# Patient Record
Sex: Male | Born: 1935 | Race: White | Hispanic: No | Marital: Married | Smoking: Current some day smoker
Health system: Southern US, Community
[De-identification: ages and names within clinical notes are randomized; demographics above are authoritative.]

## PROBLEM LIST (undated history)

## (undated) DIAGNOSIS — E119 Type 2 diabetes mellitus without complications: Secondary | ICD-10-CM

## (undated) DIAGNOSIS — F419 Anxiety disorder, unspecified: Secondary | ICD-10-CM

## (undated) DIAGNOSIS — I10 Essential (primary) hypertension: Secondary | ICD-10-CM

## (undated) DIAGNOSIS — R351 Nocturia: Secondary | ICD-10-CM

## (undated) DIAGNOSIS — IMO0001 Reserved for inherently not codable concepts without codable children: Secondary | ICD-10-CM

## (undated) DIAGNOSIS — K219 Gastro-esophageal reflux disease without esophagitis: Secondary | ICD-10-CM

## (undated) DIAGNOSIS — E785 Hyperlipidemia, unspecified: Secondary | ICD-10-CM

## (undated) DIAGNOSIS — M1990 Unspecified osteoarthritis, unspecified site: Secondary | ICD-10-CM

## (undated) DIAGNOSIS — R350 Frequency of micturition: Secondary | ICD-10-CM

## (undated) HISTORY — PX: OTHER SURGICAL HISTORY: SHX169

---

## 1968-07-01 HISTORY — PX: OTHER SURGICAL HISTORY: SHX169

## 1984-07-01 HISTORY — PX: SPINAL FUSION: SHX223

## 1996-07-01 HISTORY — PX: CHOLECYSTECTOMY: SHX55

## 1999-06-14 ENCOUNTER — Ambulatory Visit (HOSPITAL_COMMUNITY): Admission: RE | Admit: 1999-06-14 | Discharge: 1999-06-14 | Payer: Self-pay | Admitting: Gastroenterology

## 2002-12-31 ENCOUNTER — Encounter: Payer: Self-pay | Admitting: Orthopedic Surgery

## 2002-12-31 ENCOUNTER — Encounter: Admission: RE | Admit: 2002-12-31 | Discharge: 2002-12-31 | Payer: Self-pay | Admitting: Orthopedic Surgery

## 2004-08-22 ENCOUNTER — Ambulatory Visit (HOSPITAL_COMMUNITY): Admission: RE | Admit: 2004-08-22 | Discharge: 2004-08-22 | Payer: Self-pay | Admitting: Gastroenterology

## 2004-08-22 ENCOUNTER — Encounter (INDEPENDENT_AMBULATORY_CARE_PROVIDER_SITE_OTHER): Payer: Self-pay | Admitting: Specialist

## 2006-05-16 ENCOUNTER — Emergency Department (HOSPITAL_COMMUNITY): Admission: EM | Admit: 2006-05-16 | Discharge: 2006-05-16 | Payer: Self-pay | Admitting: Emergency Medicine

## 2008-09-28 ENCOUNTER — Encounter: Admission: RE | Admit: 2008-09-28 | Discharge: 2008-09-28 | Payer: Self-pay | Admitting: Orthopedic Surgery

## 2008-10-12 ENCOUNTER — Encounter: Admission: RE | Admit: 2008-10-12 | Discharge: 2009-01-09 | Payer: Self-pay | Admitting: Orthopedic Surgery

## 2009-06-22 ENCOUNTER — Ambulatory Visit: Payer: Self-pay | Admitting: Diagnostic Radiology

## 2009-06-22 ENCOUNTER — Emergency Department (HOSPITAL_BASED_OUTPATIENT_CLINIC_OR_DEPARTMENT_OTHER): Admission: EM | Admit: 2009-06-22 | Discharge: 2009-06-22 | Payer: Self-pay | Admitting: Emergency Medicine

## 2010-10-01 LAB — DIFFERENTIAL
Basophils Absolute: 0 10*3/uL (ref 0.0–0.1)
Basophils Relative: 1 % (ref 0–1)
Eosinophils Absolute: 0.3 10*3/uL (ref 0.0–0.7)
Eosinophils Relative: 3 % (ref 0–5)
Monocytes Absolute: 0.6 10*3/uL (ref 0.1–1.0)
Monocytes Relative: 7 % (ref 3–12)

## 2010-10-01 LAB — CBC
HCT: 39.6 % (ref 39.0–52.0)
Hemoglobin: 14 g/dL (ref 13.0–17.0)
MCHC: 35.3 g/dL (ref 30.0–36.0)
MCV: 90 fL (ref 78.0–100.0)
RBC: 4.4 MIL/uL (ref 4.22–5.81)

## 2010-10-01 LAB — POCT CARDIAC MARKERS
CKMB, poc: 4.2 ng/mL (ref 1.0–8.0)
Myoglobin, poc: 186 ng/mL (ref 12–200)

## 2010-10-01 LAB — BASIC METABOLIC PANEL: Calcium: 9.8 mg/dL (ref 8.4–10.5)

## 2010-11-16 NOTE — Op Note (Signed)
NAMECAYLEB, [name] NO.:  [id number]   MEDICAL RECORD NO.:  [id number]          PATIENT TYPE:  AMB   LOCATION:  ENDO                         FACILITY:  [facility]   PHYSICIAN:  Danise Edge, M.D.   DATE OF BIRTH:  [birth year]   DATE OF PROCEDURE:  08/22/2004  DATE OF DISCHARGE:                                 OPERATIVE REPORT   INDICATIONS:  Ricardo Rivera is a 77 year old male born January 19, 1933. Mr.  Ricardo Rivera mother was diagnosed with colon cancer. Ricardo Rivera  colonoscopy December 2000 revealed left colonic Rivera but no  colorectal neoplasia.   ENDOSCOPIST:  Danise Edge, M.D.   PREMEDICATION:  Versed and 7.5 mg, Demerol 60 mg.   PROCEDURE:  After obtaining informed consent, Ricardo Rivera was placed in the  left lateral decubitus position. I administered intravenous Demerol and  intravenous Versed to achieve conscious sedation for the procedure. The  patient's blood pressure, oxygen saturation and cardiac rhythm were  monitored throughout the procedure and documented in the medical record.   Anal inspection and digital rectal exam were normal. The prostate was non-  nodular. The Olympus adjustable pediatric colonoscope was introduced into  the rectum and advanced to the cecum. Colonic preparation for the exam today  was satisfactory.   Ricardo Rivera. He had some difficulty  in retaining the insufflated air as I pulled the endoscope down the left  colon which compromised visualization to some degree.   Rectum normal.  Sigmoid colon and descending colon:  Extensive left colonic Rivera.  Splenic flexure normal.  Transverse colon normal.  Hepatic flexure normal.  Descending colon normal.  Cecum and ileocecal valve:  A diminutive 1 mm polyp was removed from the  proximal cecum with the cold biopsy forceps.   ASSESSMENT:  A diminutive polyp was removed from the cecum; extensive left  colonic  Rivera.   RECOMMENDATIONS:  Repeat colonoscopy or virtual colonoscopy in 5 years.      MJ/MEDQ  D:  08/22/2004  T:  08/22/2004  Job:  [id number]   cc:   Al Decant. Janey Greaser, MD  [address]  [place]  [place] [address]  Fax: [phone]

## 2011-01-14 ENCOUNTER — Other Ambulatory Visit: Payer: Self-pay

## 2011-01-14 ENCOUNTER — Encounter: Payer: Self-pay | Admitting: *Deleted

## 2011-01-14 ENCOUNTER — Emergency Department (HOSPITAL_BASED_OUTPATIENT_CLINIC_OR_DEPARTMENT_OTHER)
Admission: EM | Admit: 2011-01-14 | Discharge: 2011-01-14 | Disposition: A | Discharge status: Home / Self Care | Payer: Medicare Other | Attending: Emergency Medicine | Admitting: Emergency Medicine

## 2011-01-14 DIAGNOSIS — R071 Chest pain on breathing: Secondary | ICD-10-CM | POA: Insufficient documentation

## 2011-01-14 DIAGNOSIS — F17200 Nicotine dependence, unspecified, uncomplicated: Secondary | ICD-10-CM | POA: Insufficient documentation

## 2011-01-14 DIAGNOSIS — R079 Chest pain, unspecified: Secondary | ICD-10-CM | POA: Insufficient documentation

## 2011-01-14 DIAGNOSIS — R0789 Other chest pain: Secondary | ICD-10-CM

## 2011-01-14 LAB — DIFFERENTIAL
Eosinophils Relative: 5 % (ref 0–5)
Lymphs Abs: 3.5 10*3/uL (ref 0.7–4.0)
Neutro Abs: 3.6 10*3/uL (ref 1.7–7.7)
Neutrophils Relative %: 44 % (ref 43–77)

## 2011-01-14 LAB — COMPREHENSIVE METABOLIC PANEL
ALT: 12 U/L (ref 0–53)
AST: 16 U/L (ref 0–37)
Albumin: 3.6 g/dL (ref 3.5–5.2)
CO2: 24 mEq/L (ref 19–32)
GFR calc non Af Amer: >60 mL/min (ref >60)
Total Bilirubin: 2.4 mg/dL — ABNORMAL HIGH (ref 0.3–1.2)
Total Protein: 8.9 g/dL — ABNORMAL HIGH (ref 6.0–8.3)

## 2011-01-14 LAB — CK TOTAL AND CKMB (NOT AT ARMC): CK, MB: 2.3 ng/mL (ref 0.3–4.0)

## 2011-01-14 LAB — CBC
HCT: 37.3 % — ABNORMAL LOW (ref 39.0–52.0)
MCH: 29.8 pg (ref 26.0–34.0)
MCV: 85.6 fL (ref 78.0–100.0)
WBC: 8.3 10*3/uL (ref 4.0–10.5)

## 2011-01-14 MED ORDER — ASPIRIN 81 MG PO CHEW
324.0000 mg | CHEWABLE_TABLET | Freq: Once | ORAL | Status: AC
  Administered 2011-01-14: 324 mg via ORAL
  Filled 2011-01-14: qty 4

## 2011-01-14 NOTE — ED Notes (Signed)
MD at bedside. 

## 2011-01-14 NOTE — ED Notes (Signed)
Pt to room 7 in w/c, able to stand and walk to bed in nad.  Pt reports mid chest "sharp" pains after working out at fitness center on Thursday.  Pt states he only has pain if he coughs or moves a certain way, no pain or sensation in his chest at any other time.  Denies sob, nausea or any other c/o, states he has been "belching a lot" also.

## 2011-01-14 NOTE — ED Provider Notes (Signed)
History     Chief Complaint  Patient presents with  . Chest Pain   The history is provided by the patient and the spouse.  The patient began having sharp right sided chest pain which began Friday after lifting weights at gym  Pain has occurred with movement of arm and trunk but not exertional.  Pain is sharp but not associated with dyspnea, sweating, light headedness.  The patient has had a stress test in the past which his wife reports as normal. There is also low back pain which occurred at sight of previous fusion after laying on stretcher.    History reviewed. No pertinent past medical history.  History reviewed. No pertinent past surgical history.  History reviewed. No pertinent family history.  History  Substance Use Topics  . Smoking status: Current Everyday Smoker  . Smokeless tobacco: Not on file  . Alcohol Use: No      Review of Systems  All other systems reviewed and are negative.    Physical Exam  BP 155/84  Temp(Src) 98.4 F (36.9 C) (Oral)  Ht 5\' 11"  (1.803 m)  Wt 190 lb (86.183 kg)  BMI 26.50 kg/m2  SpO2 95%  Physical Exam  Nursing note and vitals reviewed. Constitutional: He is oriented to person, place, and time. He appears well-developed and well-nourished.  HENT:  Head: Normocephalic and atraumatic.  Eyes: Pupils are equal, round, and reactive to light.  Neck: Normal range of motion.  Cardiovascular: Normal rate and regular rhythm.   Pulmonary/Chest: He exhibits tenderness.       Tender over sternum and right side of chest with palpation reproducing pain  Abdominal: Soft. Bowel sounds are normal.  Musculoskeletal: Normal range of motion.  Neurological: He is alert and oriented to person, place, and time.  Skin: Skin is warm and dry.  Psychiatric: He has a normal mood and affect.    ED Course  Procedures  MDM  Date: 01/14/2011  Rate: 96 Rhythm: normal sinus rhythm  QRS Axis: normal  Intervals: normal  ST/T Wave abnormalities: normal  Conduction Disutrbances:right bundle branch block  Narrative Interpretation:   Old EKG Reviewed: unchanged from 22 Jun 2009       Hilario Quarry, MD 01/14/11 1616

## 2014-01-01 ENCOUNTER — Encounter (HOSPITAL_BASED_OUTPATIENT_CLINIC_OR_DEPARTMENT_OTHER): Payer: Self-pay | Admitting: Emergency Medicine

## 2014-01-01 ENCOUNTER — Emergency Department (HOSPITAL_BASED_OUTPATIENT_CLINIC_OR_DEPARTMENT_OTHER)
Admission: EM | Admit: 2014-01-01 | Discharge: 2014-01-01 | Disposition: A | Discharge status: Home / Self Care | Payer: Medicare Other | Attending: Emergency Medicine | Admitting: Emergency Medicine

## 2014-01-01 DIAGNOSIS — F17200 Nicotine dependence, unspecified, uncomplicated: Secondary | ICD-10-CM | POA: Insufficient documentation

## 2014-01-01 DIAGNOSIS — M5412 Radiculopathy, cervical region: Secondary | ICD-10-CM | POA: Insufficient documentation

## 2014-01-01 DIAGNOSIS — Z7982 Long term (current) use of aspirin: Secondary | ICD-10-CM | POA: Insufficient documentation

## 2014-01-01 DIAGNOSIS — M79609 Pain in unspecified limb: Secondary | ICD-10-CM | POA: Diagnosis present

## 2014-01-01 DIAGNOSIS — M792 Neuralgia and neuritis, unspecified: Secondary | ICD-10-CM

## 2014-01-01 DIAGNOSIS — Z79899 Other long term (current) drug therapy: Secondary | ICD-10-CM | POA: Diagnosis not present

## 2014-01-01 LAB — BASIC METABOLIC PANEL
ANION GAP: 18 — AB (ref 5–15)
BUN: 17 mg/dL (ref 6–23)
CO2: 21 meq/L (ref 19–32)
Calcium: 9.9 mg/dL (ref 8.4–10.5)
Chloride: 97 mEq/L (ref 96–112)
Creatinine, Ser: 0.8 mg/dL (ref 0.50–1.35)
GFR, EST NON AFRICAN AMERICAN: 83 mL/min — AB (ref >90)
GLUCOSE: 105 mg/dL — AB (ref 70–99)
Potassium: 4.5 mEq/L (ref 3.7–5.3)
SODIUM: 136 meq/L — AB (ref 137–147)

## 2014-01-01 MED ORDER — GABAPENTIN 300 MG PO CAPS: 300.0000 mg | ORAL_CAPSULE | Freq: Every day | ORAL | Status: DC

## 2014-01-01 MED ORDER — HYDROCODONE-ACETAMINOPHEN 5-325 MG PO TABS: 2.0000 | ORAL_TABLET | ORAL | Status: DC | PRN

## 2014-01-01 NOTE — ED Notes (Signed)
Patient c/o R arm shooting pain that started last week. No relief with meds

## 2014-01-01 NOTE — ED Provider Notes (Addendum)
CSN: [id number]     Arrival date & time 01/01/14  0726 History   First MD Initiated Contact with Patient 01/01/14 0750     Chief Complaint  Patient presents with  . Arm Pain      HPI Patient presents with pain to right arm.  Been there for the past week.  No known history of trauma.  His pain is a shooting pain and keeps him awake at night.  Seems to primarily involves the third fourth and fifth digit of his right hand. He als complains of some neck pain History reviewed. No pertinent past medical history. Past Surgical History  Procedure Laterality Date  . Spinal surgery      fusion  . Cholecystectomy     No family history on file. History  Substance Use Topics  . Smoking status: Current Some Day Smoker  . Smokeless tobacco: Not on file  . Alcohol Use: No    Review of Systems  All other systems reviewed and are negative  Allergies  Review of patient's allergies indicates no known allergies.  Home Medications   Prior to Admission medications   Medication Sig Start Date End Date Taking? Authorizing Provider  brimonidine (ALPHAGAN P) 0.1 % SOLN Place 2 drops into both eyes.   Yes Historical Provider, MD  Cholecalciferol (VITAMIN D) 2000 UNITS CAPS Take by mouth.   Yes Historical Provider, MD  oxybutynin (DITROPAN) 5 MG tablet Take 5 mg by mouth daily.   Yes Historical Provider, MD  pantoprazole (PROTONIX) 40 MG tablet Take 40 mg by mouth daily.   Yes Historical Provider, MD  Travoprost, BAK Free, (TRAVATAN) 0.004 % SOLN ophthalmic solution Place 1 drop into both eyes at bedtime.   Yes Historical Provider, MD  allopurinol (ZYLOPRIM) 300 MG tablet Take 300 mg by mouth daily.      Historical Provider, MD  ALPRAZolam Prudy Feeler(XANAX) 0.5 MG tablet Take 0.5 mg by mouth at bedtime as needed.      Historical Provider, MD  aspirin 81 MG tablet Take 81 mg by mouth daily.      Historical Provider, MD  chlorpheniramine-phenylephrine (RYNATAN) 4.5-5 MG/5ML SUSP Take by mouth every 12 (twelve)  hours as needed.      Historical Provider, MD  gabapentin (NEURONTIN) 300 MG capsule Take 1 capsule (300 mg total) by mouth at bedtime. 01/01/14   Nelia Shiobert L Aldyn Toon, MD  gemfibrozil (LOPID) 600 MG tablet Take 600 mg by mouth 2 (two) times daily before a meal.      Historical Provider, MD  HYDROcodone-acetaminophen (NORCO/VICODIN) 5-325 MG per tablet Take 2 tablets by mouth every 4 (four) hours as needed. 01/01/14   Nelia Shiobert L Lennix Rotundo, MD  losartan (COZAAR) 50 MG tablet Take 50 mg by mouth daily.      Historical Provider, MD  multivitamin [facility](THERAGRAN) tablet Take 1 tablet by mouth daily.      Historical Provider, MD  ranitidine (ZANTAC) 150 MG capsule Take 150 mg by mouth 2 (two) times daily.      Historical Provider, MD   BP 135/69  Pulse 77  Temp(Src) 97.8 F (36.6 C) (Oral)  Resp 18  Ht 5\' 11"  (1.803 m)  Wt 180 lb (81.647 kg)  BMI 25.12 kg/m2  SpO2 98% Physical Exam  Nursing note and vitals reviewed. Constitutional: He is oriented to person, place, and time. He appears well-developed and well-nourished. No distress.  HENT:  Head: Normocephalic and atraumatic.  Eyes: Pupils are equal, round, and reactive to light.  Neck:  Normal range of motion.  Cardiovascular: Normal rate and intact distal pulses.   Pulses:      Radial pulses are 2+ on the right side, and 2+ on the left side.  Pulmonary/Chest: No respiratory distress.  Abdominal: Normal appearance. He exhibits no distension.  Musculoskeletal: He exhibits tenderness.       Right shoulder: He exhibits normal range of motion and no tenderness.       Right elbow: He exhibits normal range of motion and no swelling.       Right wrist: He exhibits tenderness. He exhibits normal range of motion.  Neurological: He is alert and oriented to person, place, and time. No cranial nerve deficit.  Skin: Skin is warm and dry. No rash noted.  Psychiatric: He has a normal mood and affect. His behavior is normal.    ED Course  Procedures (including  critical care time) Labs Review Labs Reviewed  BASIC METABOLIC PANEL - Abnormal; Notable for the following:    Sodium 136 (*)    Glucose, Bld 105 (*)    GFR calc non Af Amer 83 (*)    Anion gap 18 (*)    All other components within normal limits    Imaging Review No results found.    MDM   Final diagnoses:  Radicular pain in right arm        Nelia Shiobert L Lamya Lausch, MD 01/01/14 [id number]  Nelia Shiobert L Gerlean Cid, MD 01/01/14 [phone]

## 2014-01-01 NOTE — Discharge Instructions (Signed)
Increase Aleve to 2 tablets at bedtime   Neuropathic Pain We often think that pain has a physical cause. If we get rid of the cause, the pain should go away. Nerves themselves can also cause pain. It is called neuropathic pain, which means nerve abnormality. It may be difficult for the patients who have it and for the treating caregivers. Pain is usually described as acute (short-lived) or chronic (long-lasting). Acute pain is related to the physical sensations caused by an injury. It can last from a few seconds to many weeks, but it usually goes away when normal healing occurs. Chronic pain lasts beyond the typical healing time. With neuropathic pain, the nerve fibers themselves may be damaged or injured. They then send incorrect signals to other pain centers. The pain you feel is real, but the cause is not easy to find.  CAUSES  Chronic pain can result from diseases, such as diabetes and shingles (an infection related to chickenpox), or from trauma, surgery, or amputation. It can also happen without any known injury or disease. The nerves are sending pain messages, even though there is no identifiable cause for such messages.   Other common causes of neuropathy include diabetes, phantom limb pain, or Regional Pain Syndrome (RPS).  As with all forms of chronic back pain, if neuropathy is not correctly treated, there can be a number of associated problems that lead to a downward cycle for the patient. These include depression, sleeplessness, feelings of fear and anxiety, limited social interaction and inability to do normal daily activities or work.  The most dramatic and mysterious example of neuropathic pain is called "phantom limb syndrome." This occurs when an arm or a leg has been removed because of illness or injury. The brain still gets pain messages from the nerves that originally carried impulses from the missing limb. These nerves now seem to misfire and cause troubling pain.  Neuropathic  pain often seems to have no cause. It responds poorly to standard pain treatment. Neuropathic pain can occur after:  Shingles (herpes zoster virus infection).  A lasting burning sensation of the skin, caused usually by injury to a peripheral nerve.  Peripheral neuropathy which is widespread nerve damage, often caused by diabetes or alcoholism.  Phantom limb pain following an amputation.  Facial nerve problems (trigeminal neuralgia).  Multiple sclerosis.  Reflex sympathetic dystrophy.  Pain which comes with cancer and cancer chemotherapy.  Entrapment neuropathy such as when pressure is put on a nerve such as in carpal tunnel syndrome.  Back, leg, and hip problems (sciatica).  Spine or back surgery.  HIV Infection or AIDS where nerves are infected by viruses. Your caregiver can explain items in the above list which may apply to you. SYMPTOMS  Characteristics of neuropathic pain are:  Severe, sharp, electric shock-like, shooting, lightening-like, knife-like.  Pins and needles sensation.  Deep burning, deep cold, or deep ache.  Persistent numbness, tingling, or weakness.  Pain resulting from light touch or other stimulus that would not usually cause pain.  Increased sensitivity to something that would normally cause pain, such as a pinprick. Pain may persist for months or years following the healing of damaged tissues. When this happens, pain signals no longer sound an alarm about current injuries or injuries about to happen. Instead, the alarm system itself is not working correctly.  Neuropathic pain may get worse instead of better over time. For some people, it can lead to serious disability. It is important to be aware that severe injury in  a limb can occur without a proper, protective pain response.Burns, cuts, and other injuries may go unnoticed. Without proper treatment, these injuries can become infected or lead to further disability. Take any injury seriously, and  consult your caregiver for treatment. DIAGNOSIS  When you have a pain with no known cause, your caregiver will probably ask some specific questions:   Do you have any other conditions, such as diabetes, shingles, multiple sclerosis, or HIV infection?  How would you describe your pain? (Neuropathic pain is often described as shooting, stabbing, burning, or searing.)  Is your pain worse at any time of the day? (Neuropathic pain is usually worse at night.)  Does the pain seem to follow a certain physical pathway?  Does the pain come from an area that has missing or injured nerves? (An example would be phantom limb pain.)  Is the pain triggered by minor things such as rubbing against the sheets at night? These questions often help define the type of pain involved. Once your caregiver knows what is happening, treatment can begin. Anticonvulsant, antidepressant drugs, and various pain relievers seem to work in some cases. If another condition, such as diabetes is involved, better management of that disorder may relieve the neuropathic pain.  TREATMENT  Neuropathic pain is frequently long-lasting and tends not to respond to treatment with narcotic type pain medication. It may respond well to other drugs such as antiseizure and antidepressant medications. Usually, neuropathic problems do not completely go away, but partial improvement is often possible with proper treatment. Your caregivers have large numbers of medications available to treat you. Do not be discouraged if you do not get immediate relief. Sometimes different medications or a combination of medications will be tried before you receive the results you are hoping for. See your caregiver if you have pain that seems to be coming from nowhere and does not go away. Help is available.  SEEK IMMEDIATE MEDICAL CARE IF:   There is a sudden change in the quality of your pain, especially if the change is on only one side of the body.  You notice  changes of the skin, such as redness, black or purple discoloration, swelling, or an ulcer.  You cannot move the affected limbs. Document Released: 03/14/2004 Document Revised: 09/09/2011 Document Reviewed: 03/14/2004 [facility] Patient Information 2015 [place], [place]. This information is not intended to replace advice given to you by your health care provider. Make sure you discuss any questions you have with your health care provider.

## 2014-01-03 ENCOUNTER — Other Ambulatory Visit: Payer: Self-pay | Admitting: Family Medicine

## 2014-01-03 DIAGNOSIS — M5412 Radiculopathy, cervical region: Secondary | ICD-10-CM

## 2014-01-10 ENCOUNTER — Ambulatory Visit: Payer: Medicare Other | Attending: Family Medicine | Admitting: Physical Therapy

## 2014-01-10 DIAGNOSIS — M25519 Pain in unspecified shoulder: Secondary | ICD-10-CM | POA: Diagnosis not present

## 2014-01-10 DIAGNOSIS — M542 Cervicalgia: Secondary | ICD-10-CM | POA: Diagnosis not present

## 2014-01-14 ENCOUNTER — Ambulatory Visit
Admission: RE | Admit: 2014-01-14 | Discharge: 2014-01-14 | Disposition: A | Discharge status: Home / Self Care | Payer: Medicare Other | Source: Ambulatory Visit | Attending: Family Medicine | Admitting: Family Medicine

## 2014-01-14 DIAGNOSIS — M5412 Radiculopathy, cervical region: Secondary | ICD-10-CM

## 2014-01-19 ENCOUNTER — Ambulatory Visit: Payer: Medicare Other | Admitting: Physical Therapy

## 2014-01-19 DIAGNOSIS — M25519 Pain in unspecified shoulder: Secondary | ICD-10-CM | POA: Diagnosis not present

## 2014-01-25 ENCOUNTER — Ambulatory Visit: Payer: Medicare Other | Admitting: Physical Therapy

## 2014-01-25 DIAGNOSIS — M25519 Pain in unspecified shoulder: Secondary | ICD-10-CM | POA: Diagnosis not present

## 2014-02-01 ENCOUNTER — Ambulatory Visit: Payer: Medicare Other | Attending: Family Medicine | Admitting: Physical Therapy

## 2014-02-01 DIAGNOSIS — M25519 Pain in unspecified shoulder: Secondary | ICD-10-CM | POA: Diagnosis present

## 2014-02-01 DIAGNOSIS — M542 Cervicalgia: Secondary | ICD-10-CM | POA: Diagnosis not present

## 2014-02-03 ENCOUNTER — Ambulatory Visit: Payer: Medicare Other | Admitting: Physical Therapy

## 2014-02-03 DIAGNOSIS — M25519 Pain in unspecified shoulder: Secondary | ICD-10-CM | POA: Diagnosis not present

## 2014-02-09 ENCOUNTER — Ambulatory Visit: Payer: Medicare Other | Admitting: Physical Therapy

## 2014-02-09 DIAGNOSIS — M25519 Pain in unspecified shoulder: Secondary | ICD-10-CM | POA: Diagnosis not present

## 2014-02-11 ENCOUNTER — Ambulatory Visit: Payer: Medicare Other | Admitting: Physical Therapy

## 2014-02-11 DIAGNOSIS — M25519 Pain in unspecified shoulder: Secondary | ICD-10-CM | POA: Diagnosis not present

## 2014-02-17 ENCOUNTER — Ambulatory Visit: Payer: Medicare Other | Admitting: Physical Therapy

## 2014-02-17 DIAGNOSIS — M25519 Pain in unspecified shoulder: Secondary | ICD-10-CM | POA: Diagnosis not present

## 2014-02-22 ENCOUNTER — Ambulatory Visit: Payer: Medicare Other | Admitting: Physical Therapy

## 2014-02-22 DIAGNOSIS — M25519 Pain in unspecified shoulder: Secondary | ICD-10-CM | POA: Diagnosis not present

## 2014-02-24 ENCOUNTER — Ambulatory Visit: Payer: Medicare Other | Admitting: Physical Therapy

## 2014-08-19 ENCOUNTER — Other Ambulatory Visit (HOSPITAL_COMMUNITY): Payer: Self-pay | Admitting: Neurosurgery

## 2014-09-02 NOTE — Pre-Procedure Instructions (Signed)
Ricardo Rivera  09/02/2014   Your procedure is scheduled on:  Friday, March 18.  [facility]15[facility].  Call this number if you have problems the morning of surgery: [phone]              For any other questions, please call [phone], Monday - Friday 8 AM - 4 PM.  Remember:   Do not eat food or drink liquids after midnight Thursday, March 17.   Take these medicines the morning of surgery with A SIP OF WATER: pantoprazole (PROTONIX).                Stop taking Aspirin, Coumadin, Plavix, Effient and Herbal medications.  Do not take any NSAIDs ie: Ibuprofen,  Advil,Naproxen or any medication containing Aspirin.   Do not wear jewelry, make-up or nail polish.  Do not wear lotions, powders, or perfumes.   Men may shave face and neck.  Do not bring valuables to the hospital.             [facility] is not responsible  for any belongings or valuables.               Contacts, dentures or bridgework may not be worn into surgery.  Leave suitcase in the car. After surgery it may be brought to your room.  For patients admitted to the hospital, discharge time is determined by your treatment team.               Patients discharged the day of surgery will not be allowed to drive home.  Name and phone number of your driver: -   Special Instructions: Review  [facility] - Preparing For Surgery.   Please read over the following fact sheets that you were given: Pain Booklet, Coughing and Deep Breathing and Surgical Site Infection Prevention

## 2014-09-05 ENCOUNTER — Encounter (HOSPITAL_COMMUNITY): Payer: Self-pay

## 2014-09-05 ENCOUNTER — Encounter (HOSPITAL_COMMUNITY)
Admission: RE | Admit: 2014-09-05 | Discharge: 2014-09-05 | Disposition: A | Discharge status: Home / Self Care | Payer: Medicare Other | Source: Ambulatory Visit | Attending: Neurosurgery | Admitting: Neurosurgery

## 2014-09-05 DIAGNOSIS — Z01812 Encounter for preprocedural laboratory examination: Secondary | ICD-10-CM | POA: Diagnosis not present

## 2014-09-05 DIAGNOSIS — M4302 Spondylolysis, cervical region: Secondary | ICD-10-CM | POA: Insufficient documentation

## 2014-09-05 DIAGNOSIS — Z01810 Encounter for preprocedural cardiovascular examination: Secondary | ICD-10-CM | POA: Diagnosis present

## 2014-09-05 DIAGNOSIS — Z01818 Encounter for other preprocedural examination: Secondary | ICD-10-CM | POA: Insufficient documentation

## 2014-09-05 HISTORY — DX: Hyperlipidemia, unspecified: E78.5

## 2014-09-05 HISTORY — DX: Nocturia: R35.1

## 2014-09-05 HISTORY — DX: Reserved for inherently not codable concepts without codable children: IMO0001

## 2014-09-05 HISTORY — DX: Essential (primary) hypertension: I10

## 2014-09-05 HISTORY — DX: Frequency of micturition: R35.0

## 2014-09-05 HISTORY — DX: Unspecified osteoarthritis, unspecified site: M19.90

## 2014-09-05 LAB — CBC
HCT: 37.9 % — ABNORMAL LOW (ref 39.0–52.0)
Hemoglobin: 13.1 g/dL (ref 13.0–17.0)
MCH: 29.6 pg (ref 26.0–34.0)
MCHC: 34.6 g/dL (ref 30.0–36.0)
MCV: 85.7 fL (ref 78.0–100.0)
PLATELETS: 250 10*3/uL (ref 150–400)
RBC: 4.42 MIL/uL (ref 4.22–5.81)
RDW: 13.8 % (ref 11.5–15.5)
WBC: 8.9 10*3/uL (ref 4.0–10.5)

## 2014-09-05 LAB — BASIC METABOLIC PANEL
Anion gap: 9 (ref 5–15)
BUN: 13 mg/dL (ref 6–23)
CALCIUM: 9.4 mg/dL (ref 8.4–10.5)
CO2: 27 mmol/L (ref 19–32)
Chloride: 98 mmol/L (ref 96–112)
Creatinine, Ser: 0.88 mg/dL (ref 0.50–1.35)
GFR calc Af Amer: >90 mL/min (ref >90)
GFR calc non Af Amer: 80 mL/min — ABNORMAL LOW (ref >90)
GLUCOSE: 79 mg/dL (ref 70–99)
POTASSIUM: 4.2 mmol/L (ref 3.5–5.1)
Sodium: 134 mmol/L — ABNORMAL LOW (ref 135–145)

## 2014-09-05 LAB — SURGICAL PCR SCREEN
MRSA, PCR: NEGATIVE
STAPHYLOCOCCUS AUREUS: NEGATIVE

## 2014-09-15 MED ORDER — CEFAZOLIN SODIUM-DEXTROSE 2-3 GM-% IV SOLR
2.0000 g | INTRAVENOUS | Status: DC
  Filled 2014-09-15: qty 50

## 2014-09-16 ENCOUNTER — Ambulatory Visit (HOSPITAL_COMMUNITY)
Admission: RE | Admit: 2014-09-16 | Discharge: 2014-09-16 | Disposition: A | Discharge status: Home / Self Care | Payer: Medicare Other | Source: Ambulatory Visit | Attending: Neurosurgery | Admitting: Neurosurgery

## 2014-09-16 ENCOUNTER — Encounter (HOSPITAL_COMMUNITY)
Admission: RE | Disposition: A | Discharge status: Home / Self Care | Payer: Self-pay | Source: Ambulatory Visit | Attending: Neurosurgery

## 2014-09-16 ENCOUNTER — Other Ambulatory Visit (HOSPITAL_COMMUNITY): Payer: Self-pay | Admitting: Neurosurgery

## 2014-09-16 SURGERY — ANTERIOR CERVICAL DECOMPRESSION/DISCECTOMY FUSION 4 LEVELS
Anesthesia: General

## 2014-09-16 MED ORDER — HYDROCODONE-ACETAMINOPHEN 5-325 MG PO TABS: 1.0000 | ORAL_TABLET | ORAL | Status: DC | PRN

## 2014-09-16 MED ORDER — FENTANYL CITRATE 0.05 MG/ML IJ SOLN
INTRAMUSCULAR | Status: AC
  Filled 2014-09-16: qty 5

## 2014-09-16 MED ORDER — PROPOFOL 10 MG/ML IV BOLUS
INTRAVENOUS | Status: AC
  Filled 2014-09-16: qty 20

## 2014-09-16 MED ORDER — LACTATED RINGERS IV SOLN: INTRAVENOUS | Status: DC

## 2014-09-16 MED ORDER — DEXAMETHASONE SODIUM PHOSPHATE 4 MG/ML IJ SOLN
INTRAMUSCULAR | Status: AC
  Filled 2014-09-16: qty 1

## 2014-09-16 SURGICAL SUPPLY — 59 items
APL SKNCLS STERI-STRIP NONHPOA (GAUZE/BANDAGES/DRESSINGS)
BAG DECANTER FOR FLEXI CONT (MISCELLANEOUS) ×4 IMPLANT
BENZOIN TINCTURE PRP APPL 2/3 (GAUZE/BANDAGES/DRESSINGS) IMPLANT
BLADE CLIPPER SURG (BLADE) IMPLANT
BLADE SURG 11 STRL SS (BLADE) ×4 IMPLANT
BUR MATCHSTICK NEURO 3.0 LAGG (BURR) ×4 IMPLANT
CANISTER SUCT 3000ML PPV (MISCELLANEOUS) ×4 IMPLANT
CLOSURE WOUND 1/2 X4 (GAUZE/BANDAGES/DRESSINGS)
CONT SPEC 4OZ CLIKSEAL STRL BL (MISCELLANEOUS) ×4 IMPLANT
DECANTER SPIKE VIAL GLASS SM (MISCELLANEOUS) ×4 IMPLANT
DRAIN CHANNEL 10M FLAT 3/4 FLT (DRAIN) IMPLANT
DRAPE C-ARM 42X72 X-RAY (DRAPES) ×8 IMPLANT
DRAPE LAPAROTOMY 100X72 PEDS (DRAPES) ×4 IMPLANT
DRAPE MICROSCOPE LEICA (MISCELLANEOUS) ×4 IMPLANT
DRAPE POUCH INSTRU U-SHP 10X18 (DRAPES) ×4 IMPLANT
DRAPE PROXIMA HALF (DRAPES) IMPLANT
DRSG OPSITE POSTOP 3X4 (GAUZE/BANDAGES/DRESSINGS) ×4 IMPLANT
DRSG TEGADERM 4X4.75 (GAUZE/BANDAGES/DRESSINGS) ×16 IMPLANT
DURAPREP 6ML APPLICATOR 50/CS (WOUND CARE) ×4 IMPLANT
ELECT COATED BLADE 2.86 ST (ELECTRODE) ×4 IMPLANT
ELECT REM PT RETURN 9FT ADLT (ELECTROSURGICAL) ×3
ELECTRODE REM PT RTRN 9FT ADLT (ELECTROSURGICAL) ×2 IMPLANT
EVACUATOR SILICONE 100CC (DRAIN) IMPLANT
GAUZE SPONGE 4X4 16PLY XRAY LF (GAUZE/BANDAGES/DRESSINGS) IMPLANT
GLOVE BIOGEL PI IND STRL 7.5 (GLOVE) ×2 IMPLANT
GLOVE BIOGEL PI INDICATOR 7.5 (GLOVE) ×2
GLOVE ECLIPSE 7.0 STRL STRAW (GLOVE) ×4 IMPLANT
GLOVE EXAM NITRILE LRG STRL (GLOVE) IMPLANT
GLOVE EXAM NITRILE MD LF STRL (GLOVE) IMPLANT
GLOVE EXAM NITRILE XL STR (GLOVE) IMPLANT
GLOVE EXAM NITRILE XS STR PU (GLOVE) IMPLANT
GOWN STRL REUS W/ TWL LRG LVL3 (GOWN DISPOSABLE) ×4 IMPLANT
GOWN STRL REUS W/ TWL XL LVL3 (GOWN DISPOSABLE) IMPLANT
GOWN STRL REUS W/TWL 2XL LVL3 (GOWN DISPOSABLE) IMPLANT
GOWN STRL REUS W/TWL LRG LVL3 (GOWN DISPOSABLE) ×6
GOWN STRL REUS W/TWL XL LVL3 (GOWN DISPOSABLE)
HEMOSTAT POWDER KIT SURGIFOAM (HEMOSTASIS) ×4 IMPLANT
KIT BASIN OR (CUSTOM PROCEDURE TRAY) ×4 IMPLANT
KIT ROOM TURNOVER OR (KITS) ×4 IMPLANT
LIQUID BAND (GAUZE/BANDAGES/DRESSINGS) ×4 IMPLANT
NDL HYPO 25X1 1.5 SAFETY (NEEDLE) ×1 IMPLANT
NDL SPNL 22GX3.5 QUINCKE BK (NEEDLE) ×1 IMPLANT
NEEDLE HYPO 25X1 1.5 SAFETY (NEEDLE) ×3 IMPLANT
NEEDLE SPNL 22GX3.5 QUINCKE BK (NEEDLE) ×3 IMPLANT
NS IRRIG 1000ML POUR BTL (IV SOLUTION) ×4 IMPLANT
PACK LAMINECTOMY NEURO (CUSTOM PROCEDURE TRAY) ×4 IMPLANT
PAD ARMBOARD 7.5X6 YLW CONV (MISCELLANEOUS) ×12 IMPLANT
RUBBERBAND STERILE (MISCELLANEOUS) ×8 IMPLANT
SPONGE INTESTINAL PEANUT (DISPOSABLE) ×4 IMPLANT
SPONGE SURGIFOAM ABS GEL 100 (HEMOSTASIS) ×4 IMPLANT
STRIP CLOSURE SKIN 1/2X4 (GAUZE/BANDAGES/DRESSINGS) IMPLANT
SUT ETHILON 3 0 FSL (SUTURE) IMPLANT
SUT VIC AB 3-0 SH 8-18 (SUTURE) ×4 IMPLANT
SUT VICRYL 3-0 RB1 18 ABS (SUTURE) ×8 IMPLANT
SYR 20ML ECCENTRIC (SYRINGE) ×4 IMPLANT
TAPE CLOTH 2X10 TAN LF (GAUZE/BANDAGES/DRESSINGS) ×4 IMPLANT
TOWEL OR 17X24 6PK STRL BLUE (TOWEL DISPOSABLE) ×4 IMPLANT
TOWEL OR 17X26 10 PK STRL BLUE (TOWEL DISPOSABLE) ×4 IMPLANT
WATER STERILE IRR 1000ML POUR (IV SOLUTION) ×4 IMPLANT

## 2014-09-16 NOTE — Anesthesia Preprocedure Evaluation (Addendum)
Anesthesia Evaluation  Patient identified by MRN, date of birth, ID band Patient awake    Reviewed: Allergy & Precautions, NPO status , Patient's Chart, lab work & pertinent test results  Airway Mallampati: II  TM Distance: >3 FB Neck ROM: Full    Dental no notable dental hx.    Pulmonary Current Smoker (25 pack year),  breath sounds clear to auscultation  Pulmonary exam normal       Cardiovascular hypertension, Pt. on medications Rhythm:Regular Rate:Normal  EKG RBB 08/2014   Neuro/Psych CX mylopathy negative neurological ROS  negative psych ROS   GI/Hepatic negative GI ROS, Neg liver ROS,   Endo/Other  negative endocrine ROS  Renal/GU negative Renal ROS  negative genitourinary   Musculoskeletal negative musculoskeletal ROS (+)   Abdominal   Peds negative pediatric ROS (+)  Hematology negative hematology ROS (+) 13/38   Anesthesia Other Findings   Reproductive/Obstetrics negative OB ROS                            Anesthesia Physical Anesthesia Plan  ASA: II  Anesthesia Plan: General   Post-op Pain Management:    Induction: Intravenous  Airway Management Planned: Oral ETT and Video Laryngoscope Planned  Additional Equipment:   Intra-op Plan:   Post-operative Plan: Extubation in OR  Informed Consent: I have reviewed the patients History and Physical, chart, labs and discussed the procedure including the risks, benefits and alternatives for the proposed anesthesia with the patient or authorized representative who has indicated his/her understanding and acceptance.   Dental advisory given  Plan Discussed with: CRNA and Surgeon  Anesthesia Plan Comments: (Multimodal pain RX, Glide for CX mylopathy)       Anesthesia Quick Evaluation

## 2014-09-16 NOTE — Brief Op Note (Addendum)
250mcg fentanyl pulled out for Jermel Cullars, transferred to different patient mrn [id number] - cosigned with Joneen Carawayamanda Kiyoko Mcguirt crna

## 2014-09-22 ENCOUNTER — Encounter (HOSPITAL_COMMUNITY): Payer: Self-pay | Admitting: *Deleted

## 2014-09-22 MED ORDER — CEFAZOLIN SODIUM-DEXTROSE 2-3 GM-% IV SOLR
2.0000 g | INTRAVENOUS | Status: AC
  Administered 2014-09-23 (×2): 2 g via INTRAVENOUS
  Filled 2014-09-22: qty 50

## 2014-09-22 NOTE — Progress Notes (Signed)
Pt denies SOB, chest pain, and being under the care of a cardiologist. Pt stated that a stress test was done > 10 years ago but denies having an echo and cardiac cath. Pt made aware to stop taking Aspirin, otc vitamins and herbal medications. Do not take any NSAIDs ie: Ibuprofen, Advil, Naproxen or any medication containing Aspirin. Pt verbalized understanding of all pre-op instructions including using the CHG.

## 2014-09-23 ENCOUNTER — Inpatient Hospital Stay (HOSPITAL_COMMUNITY)
Admission: RE | Admit: 2014-09-23 | Discharge: 2014-10-05 | DRG: 003 | Disposition: A | Discharge status: Other Acute Inpatient Hospital | Payer: Medicare Other | Source: Ambulatory Visit | Attending: Neurosurgery | Admitting: Neurosurgery

## 2014-09-23 ENCOUNTER — Encounter (HOSPITAL_COMMUNITY): Payer: Self-pay | Admitting: *Deleted

## 2014-09-23 ENCOUNTER — Inpatient Hospital Stay (HOSPITAL_COMMUNITY): Payer: Medicare Other | Admitting: Anesthesiology

## 2014-09-23 ENCOUNTER — Inpatient Hospital Stay (HOSPITAL_COMMUNITY): Payer: Medicare Other

## 2014-09-23 ENCOUNTER — Encounter (HOSPITAL_COMMUNITY)
Admission: RE | Disposition: A | Discharge status: Nursing Home (Includes ICF) | Payer: PRIVATE HEALTH INSURANCE | Source: Ambulatory Visit | Attending: Neurosurgery

## 2014-09-23 DIAGNOSIS — M4712 Other spondylosis with myelopathy, cervical region: Secondary | ICD-10-CM | POA: Diagnosis present

## 2014-09-23 DIAGNOSIS — I10 Essential (primary) hypertension: Secondary | ICD-10-CM | POA: Diagnosis present

## 2014-09-23 DIAGNOSIS — L7622 Postprocedural hemorrhage and hematoma of skin and subcutaneous tissue following other procedure: Secondary | ICD-10-CM | POA: Diagnosis not present

## 2014-09-23 DIAGNOSIS — E870 Hyperosmolality and hypernatremia: Secondary | ICD-10-CM | POA: Diagnosis not present

## 2014-09-23 DIAGNOSIS — R1319 Other dysphagia: Secondary | ICD-10-CM | POA: Diagnosis not present

## 2014-09-23 DIAGNOSIS — J690 Pneumonitis due to inhalation of food and vomit: Secondary | ICD-10-CM | POA: Diagnosis not present

## 2014-09-23 DIAGNOSIS — J810 Acute pulmonary edema: Secondary | ICD-10-CM | POA: Diagnosis not present

## 2014-09-23 DIAGNOSIS — Z981 Arthrodesis status: Secondary | ICD-10-CM

## 2014-09-23 DIAGNOSIS — F17210 Nicotine dependence, cigarettes, uncomplicated: Secondary | ICD-10-CM | POA: Diagnosis present

## 2014-09-23 DIAGNOSIS — G9340 Encephalopathy, unspecified: Secondary | ICD-10-CM | POA: Diagnosis not present

## 2014-09-23 DIAGNOSIS — Z4659 Encounter for fitting and adjustment of other gastrointestinal appliance and device: Secondary | ICD-10-CM

## 2014-09-23 DIAGNOSIS — J9600 Acute respiratory failure, unspecified whether with hypoxia or hypercapnia: Secondary | ICD-10-CM

## 2014-09-23 DIAGNOSIS — Z7982 Long term (current) use of aspirin: Secondary | ICD-10-CM

## 2014-09-23 DIAGNOSIS — J9601 Acute respiratory failure with hypoxia: Secondary | ICD-10-CM | POA: Diagnosis not present

## 2014-09-23 DIAGNOSIS — J9811 Atelectasis: Secondary | ICD-10-CM | POA: Diagnosis not present

## 2014-09-23 DIAGNOSIS — Y95 Nosocomial condition: Secondary | ICD-10-CM | POA: Diagnosis not present

## 2014-09-23 DIAGNOSIS — Z01818 Encounter for other preprocedural examination: Secondary | ICD-10-CM

## 2014-09-23 DIAGNOSIS — M2578 Osteophyte, vertebrae: Secondary | ICD-10-CM | POA: Diagnosis present

## 2014-09-23 DIAGNOSIS — Z9109 Other allergy status, other than to drugs and biological substances: Secondary | ICD-10-CM

## 2014-09-23 DIAGNOSIS — M4802 Spinal stenosis, cervical region: Secondary | ICD-10-CM | POA: Diagnosis present

## 2014-09-23 DIAGNOSIS — R5381 Other malaise: Secondary | ICD-10-CM | POA: Diagnosis not present

## 2014-09-23 DIAGNOSIS — I959 Hypotension, unspecified: Secondary | ICD-10-CM | POA: Diagnosis not present

## 2014-09-23 DIAGNOSIS — J9690 Respiratory failure, unspecified, unspecified whether with hypoxia or hypercapnia: Secondary | ICD-10-CM

## 2014-09-23 DIAGNOSIS — J9620 Acute and chronic respiratory failure, unspecified whether with hypoxia or hypercapnia: Secondary | ICD-10-CM | POA: Diagnosis not present

## 2014-09-23 DIAGNOSIS — Z930 Tracheostomy status: Secondary | ICD-10-CM | POA: Diagnosis not present

## 2014-09-23 DIAGNOSIS — R600 Localized edema: Secondary | ICD-10-CM | POA: Diagnosis not present

## 2014-09-23 DIAGNOSIS — G8250 Quadriplegia, unspecified: Secondary | ICD-10-CM | POA: Diagnosis not present

## 2014-09-23 DIAGNOSIS — M4322 Fusion of spine, cervical region: Secondary | ICD-10-CM

## 2014-09-23 DIAGNOSIS — R451 Restlessness and agitation: Secondary | ICD-10-CM | POA: Diagnosis not present

## 2014-09-23 DIAGNOSIS — T17908D Unspecified foreign body in respiratory tract, part unspecified causing other injury, subsequent encounter: Secondary | ICD-10-CM | POA: Diagnosis not present

## 2014-09-23 DIAGNOSIS — R609 Edema, unspecified: Secondary | ICD-10-CM

## 2014-09-23 DIAGNOSIS — E785 Hyperlipidemia, unspecified: Secondary | ICD-10-CM | POA: Diagnosis present

## 2014-09-23 DIAGNOSIS — Z978 Presence of other specified devices: Secondary | ICD-10-CM

## 2014-09-23 HISTORY — PX: ANTERIOR CERVICAL DECOMPRESSION/DISCECTOMY FUSION 4 LEVELS: SHX5556

## 2014-09-23 LAB — CBC
HCT: 36.9 % — ABNORMAL LOW (ref 39.0–52.0)
Hemoglobin: 12.7 g/dL — ABNORMAL LOW (ref 13.0–17.0)
MCH: 29.7 pg (ref 26.0–34.0)
MCHC: 34.4 g/dL (ref 30.0–36.0)
MCV: 86.2 fL (ref 78.0–100.0)
PLATELETS: 242 10*3/uL (ref 150–400)
RBC: 4.28 MIL/uL (ref 4.22–5.81)
RDW: 13.5 % (ref 11.5–15.5)
WBC: 7.7 10*3/uL (ref 4.0–10.5)

## 2014-09-23 LAB — BASIC METABOLIC PANEL
Anion gap: 10 (ref 5–15)
BUN: 15 mg/dL (ref 6–23)
CHLORIDE: 102 mmol/L (ref 96–112)
CO2: 23 mmol/L (ref 19–32)
CREATININE: 0.95 mg/dL (ref 0.50–1.35)
Calcium: 9.4 mg/dL (ref 8.4–10.5)
GFR calc Af Amer: 89 mL/min — ABNORMAL LOW (ref >90)
GFR calc non Af Amer: 77 mL/min — ABNORMAL LOW (ref >90)
Glucose, Bld: 92 mg/dL (ref 70–99)
Potassium: 4.3 mmol/L (ref 3.5–5.1)
Sodium: 135 mmol/L (ref 135–145)

## 2014-09-23 LAB — GLUCOSE, CAPILLARY: GLUCOSE-CAPILLARY: 112 mg/dL — AB (ref 70–99)

## 2014-09-23 SURGERY — ANTERIOR CERVICAL DECOMPRESSION/DISCECTOMY FUSION 4 LEVELS
Anesthesia: General | Site: Spine Cervical

## 2014-09-23 MED ORDER — NEOSTIGMINE METHYLSULFATE 10 MG/10ML IV SOLN
INTRAVENOUS | Status: DC | PRN
  Administered 2014-09-23: 4 mg via INTRAVENOUS

## 2014-09-23 MED ORDER — HALOPERIDOL LACTATE 5 MG/ML IJ SOLN
2.0000 mg | Freq: Once | INTRAMUSCULAR | Status: AC
  Administered 2014-09-23 (×2): 2 mg via INTRAMUSCULAR

## 2014-09-23 MED ORDER — CENTRUM SILVER ADULT 50+ PO TABS: ORAL_TABLET | Freq: Every day | ORAL | Status: DC

## 2014-09-23 MED ORDER — HYDROMORPHONE HCL 1 MG/ML IJ SOLN
0.2500 mg | INTRAMUSCULAR | Status: DC | PRN
  Administered 2014-09-23 (×3): 0.5 mg via INTRAVENOUS

## 2014-09-23 MED ORDER — FENTANYL CITRATE 0.05 MG/ML IJ SOLN
INTRAMUSCULAR | Status: AC
  Filled 2014-09-23: qty 5

## 2014-09-23 MED ORDER — LOSARTAN POTASSIUM 50 MG PO TABS
50.0000 mg | ORAL_TABLET | Freq: Every day | ORAL | Status: DC
  Filled 2014-09-23: qty 1

## 2014-09-23 MED ORDER — ROCURONIUM BROMIDE 50 MG/5ML IV SOLN
INTRAVENOUS | Status: AC
  Filled 2014-09-23: qty 1

## 2014-09-23 MED ORDER — GLYCOPYRROLATE 0.2 MG/ML IJ SOLN
INTRAMUSCULAR | Status: DC | PRN
  Administered 2014-09-23: 0.6 mg via INTRAVENOUS

## 2014-09-23 MED ORDER — HYDROMORPHONE HCL 1 MG/ML IJ SOLN
INTRAMUSCULAR | Status: AC
  Filled 2014-09-23: qty 1

## 2014-09-23 MED ORDER — ONDANSETRON HCL 4 MG/2ML IJ SOLN: 4.0000 mg | INTRAMUSCULAR | Status: DC | PRN

## 2014-09-23 MED ORDER — SODIUM CHLORIDE 0.9 % IJ SOLN
INTRAMUSCULAR | Status: AC
  Filled 2014-09-23: qty 10

## 2014-09-23 MED ORDER — HEPARIN SODIUM (PORCINE) 5000 UNIT/ML IJ SOLN
5000.0000 [IU] | Freq: Three times a day (TID) | INTRAMUSCULAR | Status: DC
Start: 2014-09-24 — End: 2014-10-05
  Administered 2014-09-24 – 2014-10-05 (×34): 5000 [IU] via SUBCUTANEOUS
  Filled 2014-09-23 (×37): qty 1

## 2014-09-23 MED ORDER — ROCURONIUM BROMIDE 100 MG/10ML IV SOLN
INTRAVENOUS | Status: DC | PRN
  Administered 2014-09-23: 10 mg via INTRAVENOUS
  Administered 2014-09-23: 50 mg via INTRAVENOUS
  Administered 2014-09-23 (×2): 20 mg via INTRAVENOUS

## 2014-09-23 MED ORDER — ACETAMINOPHEN 500 MG PO TABS: 500.0000 mg | ORAL_TABLET | Freq: Four times a day (QID) | ORAL | Status: DC | PRN

## 2014-09-23 MED ORDER — FENTANYL CITRATE 0.05 MG/ML IJ SOLN
INTRAMUSCULAR | Status: DC | PRN
  Administered 2014-09-23: 100 ug via INTRAVENOUS
  Administered 2014-09-23 (×2): 50 ug via INTRAVENOUS

## 2014-09-23 MED ORDER — OXYBUTYNIN CHLORIDE 5 MG PO TABS
5.0000 mg | ORAL_TABLET | Freq: Every day | ORAL | Status: DC
  Administered 2014-09-27 – 2014-10-05 (×8): 5 mg via ORAL
  Filled 2014-09-23 (×12): qty 1

## 2014-09-23 MED ORDER — MORPHINE SULFATE 2 MG/ML IJ SOLN
1.0000 mg | INTRAMUSCULAR | Status: DC | PRN
Start: 2014-09-23 — End: 2014-09-24
  Administered 2014-09-23 (×2): 2 mg via INTRAVENOUS
  Administered 2014-09-24: 4 mg via INTRAVENOUS
  Filled 2014-09-23: qty 2
  Filled 2014-09-23 (×2): qty 1

## 2014-09-23 MED ORDER — PHENOL 1.4 % MT LIQD: 1.0000 | OROMUCOSAL | Status: DC | PRN

## 2014-09-23 MED ORDER — DOCUSATE SODIUM 100 MG PO CAPS
100.0000 mg | ORAL_CAPSULE | Freq: Two times a day (BID) | ORAL | Status: DC
  Administered 2014-09-23 – 2014-09-27 (×2): 100 mg via ORAL
  Filled 2014-09-23 (×11): qty 1

## 2014-09-23 MED ORDER — BUPIVACAINE HCL 0.5 % IJ SOLN
INTRAMUSCULAR | Status: DC | PRN
  Administered 2014-09-23: 4 mL

## 2014-09-23 MED ORDER — ONDANSETRON HCL 4 MG/2ML IJ SOLN
INTRAMUSCULAR | Status: AC
  Filled 2014-09-23: qty 2

## 2014-09-23 MED ORDER — LIDOCAINE-EPINEPHRINE 1 %-1:100000 IJ SOLN
INTRAMUSCULAR | Status: DC | PRN
  Administered 2014-09-23: 4 mL

## 2014-09-23 MED ORDER — MENTHOL 3 MG MT LOZG: 1.0000 | LOZENGE | OROMUCOSAL | Status: DC | PRN

## 2014-09-23 MED ORDER — CEFAZOLIN SODIUM 1-5 GM-% IV SOLN
1.0000 g | Freq: Three times a day (TID) | INTRAVENOUS | Status: AC
  Administered 2014-09-23 (×2): 1 g via INTRAVENOUS
  Filled 2014-09-23 (×3): qty 50

## 2014-09-23 MED ORDER — KETAMINE HCL 100 MG/ML IJ SOLN
INTRAMUSCULAR | Status: AC
  Administered 2014-09-23: 50 mg via INTRAVENOUS
  Filled 2014-09-23: qty 1

## 2014-09-23 MED ORDER — ADULT MULTIVITAMIN W/MINERALS CH
1.0000 | ORAL_TABLET | Freq: Every day | ORAL | Status: DC
  Administered 2014-09-27 – 2014-09-28 (×2): 1 via ORAL
  Filled 2014-09-23 (×7): qty 1

## 2014-09-23 MED ORDER — DEXAMETHASONE SODIUM PHOSPHATE 10 MG/ML IJ SOLN
INTRAMUSCULAR | Status: AC
  Filled 2014-09-23: qty 1

## 2014-09-23 MED ORDER — BRIMONIDINE TARTRATE 0.2 % OP SOLN
1.0000 [drp] | Freq: Three times a day (TID) | OPHTHALMIC | Status: DC
  Administered 2014-09-23 – 2014-10-05 (×34): 1 [drp] via OPHTHALMIC
  Filled 2014-09-23 (×3): qty 5

## 2014-09-23 MED ORDER — ALPRAZOLAM 0.5 MG PO TABS: 0.5000 mg | ORAL_TABLET | Freq: Every evening | ORAL | Status: DC | PRN

## 2014-09-23 MED ORDER — DIAZEPAM 5 MG PO TABS
5.0000 mg | ORAL_TABLET | Freq: Four times a day (QID) | ORAL | Status: DC | PRN
  Administered 2014-09-23 – 2014-09-29 (×4): 5 mg via ORAL
  Filled 2014-09-23 (×4): qty 1

## 2014-09-23 MED ORDER — SUCCINYLCHOLINE CHLORIDE 20 MG/ML IJ SOLN
INTRAMUSCULAR | Status: AC
  Filled 2014-09-23: qty 1

## 2014-09-23 MED ORDER — SENNA 8.6 MG PO TABS
1.0000 | ORAL_TABLET | Freq: Two times a day (BID) | ORAL | Status: DC
  Administered 2014-09-23 – 2014-09-27 (×3): 8.6 mg via ORAL
  Filled 2014-09-23 (×13): qty 1

## 2014-09-23 MED ORDER — PROPOFOL 10 MG/ML IV BOLUS
INTRAVENOUS | Status: AC
  Filled 2014-09-23: qty 20

## 2014-09-23 MED ORDER — NEOSTIGMINE METHYLSULFATE 10 MG/10ML IV SOLN
INTRAVENOUS | Status: AC
  Filled 2014-09-23: qty 1

## 2014-09-23 MED ORDER — GLYCOPYRROLATE 0.2 MG/ML IJ SOLN
INTRAMUSCULAR | Status: AC
  Filled 2014-09-23: qty 3

## 2014-09-23 MED ORDER — LACTATED RINGERS IV SOLN
INTRAVENOUS | Status: DC | PRN
  Administered 2014-09-23 (×4): via INTRAVENOUS

## 2014-09-23 MED ORDER — EPHEDRINE SULFATE 50 MG/ML IJ SOLN
INTRAMUSCULAR | Status: AC
  Filled 2014-09-23: qty 1

## 2014-09-23 MED ORDER — ZOLPIDEM TARTRATE 5 MG PO TABS
5.0000 mg | ORAL_TABLET | Freq: Every evening | ORAL | Status: DC | PRN
  Administered 2014-09-23: 5 mg via ORAL
  Filled 2014-09-23: qty 1

## 2014-09-23 MED ORDER — PROPOFOL 10 MG/ML IV BOLUS
INTRAVENOUS | Status: DC | PRN
  Administered 2014-09-23: 20 mg via INTRAVENOUS
  Administered 2014-09-23: 130 mg via INTRAVENOUS

## 2014-09-23 MED ORDER — SODIUM CHLORIDE 0.9 % IJ SOLN
3.0000 mL | Freq: Two times a day (BID) | INTRAMUSCULAR | Status: DC
  Administered 2014-09-23: 3 mL via INTRAVENOUS

## 2014-09-23 MED ORDER — MIDAZOLAM HCL 2 MG/2ML IJ SOLN
INTRAMUSCULAR | Status: AC
  Filled 2014-09-23: qty 2

## 2014-09-23 MED ORDER — VITAMIN D3 25 MCG (1000 UNIT) PO TABS
2000.0000 [IU] | ORAL_TABLET | Freq: Every day | ORAL | Status: DC
  Administered 2014-09-27 – 2014-10-05 (×9): 2000 [IU] via ORAL
  Filled 2014-09-23 (×13): qty 2

## 2014-09-23 MED ORDER — MIDAZOLAM HCL 5 MG/5ML IJ SOLN
INTRAMUSCULAR | Status: DC | PRN
  Administered 2014-09-23 (×2): 1 mg via INTRAVENOUS

## 2014-09-23 MED ORDER — HEMOSTATIC AGENTS (NO CHARGE) OPTIME
TOPICAL | Status: DC | PRN
  Administered 2014-09-23: 1 via TOPICAL

## 2014-09-23 MED ORDER — BRIMONIDINE TARTRATE 0.15 % OP SOLN
1.0000 [drp] | Freq: Three times a day (TID) | OPHTHALMIC | Status: DC
  Filled 2014-09-23: qty 5

## 2014-09-23 MED ORDER — SODIUM CHLORIDE 0.9 % IJ SOLN
3.0000 mL | INTRAMUSCULAR | Status: DC | PRN
  Administered 2014-09-23: 3 mL via INTRAVENOUS
  Filled 2014-09-23: qty 3

## 2014-09-23 MED ORDER — PHENYLEPHRINE HCL 10 MG/ML IJ SOLN
10.0000 mg | INTRAMUSCULAR | Status: DC | PRN
  Administered 2014-09-23: 10 ug/min via INTRAVENOUS

## 2014-09-23 MED ORDER — PANTOPRAZOLE SODIUM 40 MG PO TBEC: 40.0000 mg | DELAYED_RELEASE_TABLET | Freq: Every day | ORAL | Status: DC

## 2014-09-23 MED ORDER — SODIUM CHLORIDE 0.9 % IV SOLN: 250.0000 mL | INTRAVENOUS | Status: DC

## 2014-09-23 MED ORDER — LATANOPROST 0.005 % OP SOLN
1.0000 [drp] | Freq: Every day | OPHTHALMIC | Status: DC
  Administered 2014-09-24 – 2014-10-04 (×9): 1 [drp] via OPHTHALMIC
  Filled 2014-09-23 (×6): qty 2.5

## 2014-09-23 MED ORDER — VITAMIN D 50 MCG (2000 UT) PO CAPS: 2000.0000 [IU] | ORAL_CAPSULE | Freq: Every day | ORAL | Status: DC

## 2014-09-23 MED ORDER — PROMETHAZINE HCL 25 MG/ML IJ SOLN: 6.2500 mg | INTRAMUSCULAR | Status: DC | PRN

## 2014-09-23 MED ORDER — ONDANSETRON HCL 4 MG/2ML IJ SOLN
INTRAMUSCULAR | Status: DC | PRN
  Administered 2014-09-23 (×2): 4 mg via INTRAVENOUS

## 2014-09-23 MED ORDER — GEMFIBROZIL 600 MG PO TABS
600.0000 mg | ORAL_TABLET | Freq: Two times a day (BID) | ORAL | Status: DC
Start: 2014-09-23 — End: 2014-09-24
  Filled 2014-09-23 (×5): qty 1

## 2014-09-23 MED ORDER — ALPRAZOLAM 0.5 MG PO TABS
0.5000 mg | ORAL_TABLET | Freq: Three times a day (TID) | ORAL | Status: DC | PRN
Start: 2014-09-23 — End: 2014-09-24
  Administered 2014-09-23 – 2014-09-24 (×2): 0.5 mg via ORAL
  Filled 2014-09-23 (×2): qty 1

## 2014-09-23 MED ORDER — ARTIFICIAL TEARS OP OINT
TOPICAL_OINTMENT | OPHTHALMIC | Status: AC
  Filled 2014-09-23: qty 3.5

## 2014-09-23 MED ORDER — THROMBIN 20000 UNITS EX SOLR
CUTANEOUS | Status: DC | PRN
  Administered 2014-09-23: 20 mL via TOPICAL

## 2014-09-23 MED ORDER — SODIUM CHLORIDE 0.9 % IR SOLN
Status: DC | PRN
  Administered 2014-09-23: 500 mL

## 2014-09-23 MED ORDER — HALOPERIDOL LACTATE 5 MG/ML IJ SOLN
INTRAMUSCULAR | Status: AC
  Administered 2014-09-23: 2 mg via INTRAMUSCULAR
  Filled 2014-09-23: qty 1

## 2014-09-23 MED ORDER — OXYCODONE-ACETAMINOPHEN 5-325 MG PO TABS
1.0000 | ORAL_TABLET | ORAL | Status: DC | PRN
  Administered 2014-09-23 – 2014-09-24 (×2): 2 via ORAL
  Filled 2014-09-23 (×2): qty 2

## 2014-09-23 MED ORDER — 0.9 % SODIUM CHLORIDE (POUR BTL) OPTIME
TOPICAL | Status: DC | PRN
  Administered 2014-09-23: 1000 mL

## 2014-09-23 MED ORDER — THROMBIN 5000 UNITS EX SOLR
CUTANEOUS | Status: DC | PRN
  Administered 2014-09-23 (×2): 5 mL via TOPICAL

## 2014-09-23 MED ORDER — DIAZEPAM 5 MG PO TABS
ORAL_TABLET | ORAL | Status: AC
  Filled 2014-09-23: qty 1

## 2014-09-23 MED ORDER — LIDOCAINE HCL (CARDIAC) 20 MG/ML IV SOLN
INTRAVENOUS | Status: DC | PRN
  Administered 2014-09-23: 80 mg via INTRAVENOUS

## 2014-09-23 MED ORDER — LIDOCAINE HCL (CARDIAC) 20 MG/ML IV SOLN
INTRAVENOUS | Status: AC
  Filled 2014-09-23: qty 5

## 2014-09-23 MED ORDER — ACETAMINOPHEN 10 MG/ML IV SOLN
INTRAVENOUS | Status: AC
  Administered 2014-09-23: 1000 mg via INTRAVENOUS
  Filled 2014-09-23: qty 100

## 2014-09-23 MED ORDER — DEXAMETHASONE SODIUM PHOSPHATE 10 MG/ML IJ SOLN
INTRAMUSCULAR | Status: DC | PRN
  Administered 2014-09-23: 10 mg via INTRAVENOUS

## 2014-09-23 MED ORDER — SODIUM CHLORIDE 0.9 % IV SOLN
INTRAVENOUS | Status: DC
  Administered 2014-09-23 – 2014-09-24 (×3): via INTRAVENOUS

## 2014-09-23 SURGICAL SUPPLY — 84 items
ADH SKN CLS LQ APL DERMABOND (GAUZE/BANDAGES/DRESSINGS) ×1
APL SKNCLS STERI-STRIP NONHPOA (GAUZE/BANDAGES/DRESSINGS) ×1
APL SRG 60D 8 XTD TIP BNDBL (TIP) ×1
BAG DECANTER FOR FLEXI CONT (MISCELLANEOUS) ×3 IMPLANT
BENZOIN TINCTURE PRP APPL 2/3 (GAUZE/BANDAGES/DRESSINGS) ×2 IMPLANT
BLADE CLIPPER SURG (BLADE) IMPLANT
BLADE SURG 11 STRL SS (BLADE) ×3 IMPLANT
BUR MATCHSTICK NEURO 3.0 LAGG (BURR) ×5 IMPLANT
CAGE EXPANSE 8X6X6 (Cage) ×8 IMPLANT
CAGE PEEK 6X14X11 (Cage) ×4 IMPLANT
CAGE PEEK 7X14X11 (Cage) ×6 IMPLANT
CAGE SPNL 11X14X7XRADOPQ (Cage) IMPLANT
CANISTER SUCT 3000ML PPV (MISCELLANEOUS) ×3 IMPLANT
CLOSURE WOUND 1/2 X4 (GAUZE/BANDAGES/DRESSINGS) ×1
CONT SPEC 4OZ CLIKSEAL STRL BL (MISCELLANEOUS) ×3 IMPLANT
DECANTER SPIKE VIAL GLASS SM (MISCELLANEOUS) ×1 IMPLANT
DERMABOND ADHESIVE PROPEN (GAUZE/BANDAGES/DRESSINGS) ×2
DERMABOND ADVANCED .7 DNX6 (GAUZE/BANDAGES/DRESSINGS) IMPLANT
DRAIN CHANNEL 10M FLAT 3/4 FLT (DRAIN) IMPLANT
DRAPE C-ARM 42X72 X-RAY (DRAPES) ×6 IMPLANT
DRAPE LAPAROTOMY 100X72 PEDS (DRAPES) ×3 IMPLANT
DRAPE MICROSCOPE LEICA (MISCELLANEOUS) ×3 IMPLANT
DRAPE POUCH INSTRU U-SHP 10X18 (DRAPES) ×3 IMPLANT
DRAPE PROXIMA HALF (DRAPES) ×2 IMPLANT
DRSG OPSITE POSTOP 3X4 (GAUZE/BANDAGES/DRESSINGS) ×1 IMPLANT
DRSG OPSITE POSTOP 4X6 (GAUZE/BANDAGES/DRESSINGS) ×2 IMPLANT
DRSG TEGADERM 4X4.75 (GAUZE/BANDAGES/DRESSINGS) ×4 IMPLANT
DURAPREP 6ML APPLICATOR 50/CS (WOUND CARE) ×5 IMPLANT
DURASEAL APPLICATOR TIP (TIP) ×2 IMPLANT
DURASEAL SPINE SEALANT 3ML (MISCELLANEOUS) ×2 IMPLANT
ELECT COATED BLADE 2.86 ST (ELECTRODE) ×3 IMPLANT
ELECT REM PT RETURN 9FT ADLT (ELECTROSURGICAL) ×3
ELECTRODE REM PT RTRN 9FT ADLT (ELECTROSURGICAL) ×1 IMPLANT
EVACUATOR SILICONE 100CC (DRAIN) IMPLANT
GAUZE SPONGE 4X4 16PLY XRAY LF (GAUZE/BANDAGES/DRESSINGS) IMPLANT
GLOVE BIO SURGEON STRL SZ7 (GLOVE) ×4 IMPLANT
GLOVE BIOGEL PI IND STRL 7.0 (GLOVE) IMPLANT
GLOVE BIOGEL PI IND STRL 7.5 (GLOVE) ×1 IMPLANT
GLOVE BIOGEL PI IND STRL 8.5 (GLOVE) IMPLANT
GLOVE BIOGEL PI INDICATOR 7.0 (GLOVE) ×6
GLOVE BIOGEL PI INDICATOR 7.5 (GLOVE) ×4
GLOVE BIOGEL PI INDICATOR 8.5 (GLOVE) ×2
GLOVE ECLIPSE 6.5 STRL STRAW (GLOVE) ×8 IMPLANT
GLOVE ECLIPSE 7.0 STRL STRAW (GLOVE) ×7 IMPLANT
GLOVE ECLIPSE 7.5 STRL STRAW (GLOVE) ×4 IMPLANT
GLOVE ECLIPSE 8.5 STRL (GLOVE) ×2 IMPLANT
GLOVE EXAM NITRILE LRG STRL (GLOVE) IMPLANT
GLOVE EXAM NITRILE MD LF STRL (GLOVE) IMPLANT
GLOVE EXAM NITRILE XL STR (GLOVE) IMPLANT
GLOVE EXAM NITRILE XS STR PU (GLOVE) IMPLANT
GOWN STRL REUS W/ TWL LRG LVL3 (GOWN DISPOSABLE) ×2 IMPLANT
GOWN STRL REUS W/ TWL XL LVL3 (GOWN DISPOSABLE) IMPLANT
GOWN STRL REUS W/TWL 2XL LVL3 (GOWN DISPOSABLE) ×2 IMPLANT
GOWN STRL REUS W/TWL LRG LVL3 (GOWN DISPOSABLE) ×9
GOWN STRL REUS W/TWL XL LVL3 (GOWN DISPOSABLE) ×3
GRAFT DURAGEN MATRIX 1WX1L (Tissue) ×2 IMPLANT
HEMOSTAT POWDER KIT SURGIFOAM (HEMOSTASIS) ×5 IMPLANT
KIT BASIN OR (CUSTOM PROCEDURE TRAY) ×3 IMPLANT
KIT ROOM TURNOVER OR (KITS) ×3 IMPLANT
LIQUID BAND (GAUZE/BANDAGES/DRESSINGS) ×3 IMPLANT
NDL HYPO 25X1 1.5 SAFETY (NEEDLE) ×1 IMPLANT
NDL SPNL 22GX3.5 QUINCKE BK (NEEDLE) ×1 IMPLANT
NEEDLE HYPO 25X1 1.5 SAFETY (NEEDLE) ×3 IMPLANT
NEEDLE SPNL 22GX3.5 QUINCKE BK (NEEDLE) ×6 IMPLANT
NS IRRIG 1000ML POUR BTL (IV SOLUTION) ×3 IMPLANT
PACK LAMINECTOMY NEURO (CUSTOM PROCEDURE TRAY) ×3 IMPLANT
PAD ARMBOARD 7.5X6 YLW CONV (MISCELLANEOUS) ×9 IMPLANT
PLATE 4 80XLCK NS SPNE CVD (Plate) IMPLANT
PLATE 4 ATLANTIS TRANS (Plate) ×3 IMPLANT
RUBBERBAND STERILE (MISCELLANEOUS) ×6 IMPLANT
SCREW 4.0X13 (Screw) ×33 IMPLANT
SCREW BN 13X4XSLF DRL FXANG (Screw) IMPLANT
SCREW SD 15MM FA (Screw) ×2 IMPLANT
SPONGE INTESTINAL PEANUT (DISPOSABLE) ×3 IMPLANT
SPONGE SURGIFOAM ABS GEL 100 (HEMOSTASIS) ×3 IMPLANT
STRIP CLOSURE SKIN 1/2X4 (GAUZE/BANDAGES/DRESSINGS) ×1 IMPLANT
SUT ETHILON 3 0 FSL (SUTURE) IMPLANT
SUT VIC AB 3-0 SH 8-18 (SUTURE) ×3 IMPLANT
SUT VICRYL 3-0 RB1 18 ABS (SUTURE) ×6 IMPLANT
SYR 20ML ECCENTRIC (SYRINGE) ×3 IMPLANT
TAPE CLOTH 2X10 TAN LF (GAUZE/BANDAGES/DRESSINGS) ×1 IMPLANT
TOWEL OR 17X24 6PK STRL BLUE (TOWEL DISPOSABLE) ×3 IMPLANT
TOWEL OR 17X26 10 PK STRL BLUE (TOWEL DISPOSABLE) ×3 IMPLANT
WATER STERILE IRR 1000ML POUR (IV SOLUTION) ×3 IMPLANT

## 2014-09-23 NOTE — H&P (Signed)
CC:  Neck and left arm pain  HPI:  [name] is a 81 year old man who presented initially with a primary complaint of right-sided hand pain and left arm pain. He says the pain started several weeks prior to his visit initially with tingling in the 5 digits of his right hand. He now expenses more tingling only in the second and third digits of his right hand. He is also describing a burning type pain in his right hand. He does not have any significant neck pain or pain down his right arm. He does however have a fairly sharp pain mostly in his left elbow which started at the same time. He does describe some subjective weakness of the right hand, where he has noticed dropping objects, and is also having some difficulty with fine movements including buttoning his shirt. He denies any difficulty walking, balance issues, or falls. He has not had any changes in bladder function. He was placed on a pain medication, he doesn't remember the name, which has helped a little bit with his left elbow and right hand pain. He is also started a course of physical therapy which to now has not provided any relief.  He was subsequently seen in follow-up. Since his last visit, he says that his symptoms have gotten progressively worse. He continues to have pain in his right arm and hand, as well as more pain in his left arm. H does continue to walk normally, and has not experienced any changes in bladder function.   PMH: Past Medical History  Diagnosis Date  . Hypertension   . Shortness of breath dyspnea   . Arthritis   . Hyperlipemia   . Frequent urination   . Nocturia     PSH: Past Surgical History  Procedure Laterality Date  . Spinal surgery      fusion  . Spinal fusion  1986  . Shoulder surgies Bilateral   . Lower back  1970  . Cholecystectomy  1998    SH: History  Substance Use Topics  . Smoking status: Current Some Day Smoker -- 0.25 packs/day for 50 years  . Smokeless tobacco: Never Used  .  Alcohol Use: No    MEDS: Prior to Admission medications   Medication Sig Start Date End Date Taking? Authorizing Provider  acetaminophen (TYLENOL) 500 MG tablet Take 500 mg by mouth every 6 (six) hours as needed for mild pain or moderate pain.    Yes Historical Provider, MD  ALPRAZolam Prudy Feeler(XANAX) 0.5 MG tablet Take 0.5 mg by mouth at bedtime as needed for sleep.    Yes Historical Provider, MD  aspirin 81 MG tablet Take 81 mg by mouth daily.     Yes Historical Provider, MD  brimonidine (ALPHAGAN P) 0.1 % SOLN Place 1 drop into both eyes 3 (three) times daily.    Yes Historical Provider, MD  Cholecalciferol (VITAMIN D) 2000 UNITS CAPS Take 2,000 Units by mouth daily.    Yes Historical Provider, MD  gemfibrozil (LOPID) 600 MG tablet Take 600 mg by mouth 2 (two) times daily before a meal.     Yes Historical Provider, MD  HYDROcodone-acetaminophen (NORCO/VICODIN) 5-325 MG per tablet Take 1-2 tablets by mouth every 4 (four) hours as needed. Patient taking differently: Take 1 tablet by mouth every 4 (four) hours as needed for moderate pain.  09/16/14  Yes Lisbeth RenshawNeelesh Duglas Heier, MD  latanoprost (XALATAN) 0.005 % ophthalmic solution Place 1 drop into both eyes at bedtime.   Yes Historical Provider, MD  losartan (COZAAR) 50 MG tablet Take 50 mg by mouth daily.     Yes Historical Provider, MD  Multiple Vitamins-Minerals (CENTRUM SILVER ADULT 50+ PO) Take 1 tablet by mouth daily.   Yes Historical Provider, MD  oxybutynin (DITROPAN) 5 MG tablet Take 5 mg by mouth daily.   Yes Historical Provider, MD  pantoprazole (PROTONIX) 40 MG tablet Take 40 mg by mouth daily.   Yes Historical Provider, MD  gabapentin (NEURONTIN) 300 MG capsule Take 1 capsule (300 mg total) by mouth at bedtime. Patient not taking: Reported on 09/01/2014 01/01/14   Nelva Nay, MD    ALLERGY: Allergies  Allergen Reactions  . Flexeril [Cyclobenzaprine] Other (See Comments)    Hallucinations  . Tramadol Other (See Comments)    Hallunications     ROS: ROS  NEUROLOGIC EXAM: Awake, alert, oriented Memory and concentration grossly intact Speech fluent, appropriate CN grossly intact Motor exam: Upper Extremities Deltoid Bicep Tricep Grip  Right 5/5 5/5 5/5 4/5  Left 5/5 5/5 5/5 5/5   Lower Extremity IP Quad PF DF EHL  Right 5/5 5/5 5/5 5/5 5/5  Left 5/5 5/5 5/5 5/5 5/5   Sensation grossly intact to LT  IMGAING: MRI of the cervical spine was again reviewed. This demonstrates fairly broad-based disc osteophyte complex causing spinal cord flattening at C3 C4, and C4 C5, as well as at C5 C6. In addition, there is fairly severe bilateral foraminal stenosis at C6 C7 in addition to the above 3 levels. Also noted is retrolisthesis of C5 on C6.  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  PLAN: Proceed with ACDF at C3 C4, C4 C5, C5 C6, and C6 C7   The risks of surgery were discussed in detail with the patient in the office which include but are not limited to spinal cord injury which may result in hand, arm, leg, and bowel dysfunction, postoperative dysphagia, dysphonia, neck hematoma, or subsequent surgery for epidural hematoma. The risk of CSF leak was also discussed. In addition, I explained to him that after spinal fusion surgery, there is a risk of adjacent level disease requiring future surgical intervention. The patient understood our discussion as well as the risks of the surgery and is willing to proceed. All questions were answered.

## 2014-09-23 NOTE — Progress Notes (Signed)
Patient arrive to room 4N25 at 1430 from PACU alert and in stable condition.

## 2014-09-23 NOTE — Anesthesia Postprocedure Evaluation (Signed)
  Anesthesia Post-op Note  Patient: Ricardo Rivera  Procedure(s) Performed: Procedure(s) (LRB): CERVICAL THREE-FOUR,CERVICAL FOUR-FIVE,CERVICAL FIVE-SIX,CERVICAL SIX-SEVEN,ANTERIOR CERVICAL DECOMPRESSION WITH FUSION INTERBODY PROSTHESIS PLATING AND BONEGRAFT. (N/A)  Patient Location: PACU  Anesthesia Type: General  Level of Consciousness: awake and alert   Airway and Oxygen Therapy: Patient Spontanous Breathing  Post-op Pain: mild  Post-op Assessment: Post-op Vital signs reviewed, Patient's Cardiovascular Status Stable, Respiratory Function Stable, Patent Airway and No signs of Nausea or vomiting  Last Vitals:  Filed Vitals:   09/23/14 1350  BP:   Pulse: 103  Temp:   Resp: 20    Post-op Vital Signs: stable   Complications: No apparent anesthesia complications

## 2014-09-23 NOTE — Op Note (Signed)
PREOP DIAGNOSIS: Cervical Spondylosis with myelopathy, C3-4, C4-5, C5-6, C6-7  POSTOP DIAGNOSIS: Same  PROCEDURE: 1. Discectomy at C3-4, C4-5, C5-6, C6-7 for decompression of spinal cord and exiting nerve roots  2. Placement of intervertebral biomechanical device  [employer] PTC PEEK cages 3. Placement of anterior instrumentation consisting of interbody plate and screws spanning C3-C7 4. Use of non-structural bone allograft  5. Arthrodesis C3-4, C4-5, C5-6, C6-7, anterior interbody technique  6. Use of intraoperative microscope  SURGEON: Dr. Consuella Lose, MD  ASSISTANT: Dr. Kristeen Miss, MD  ANESTHESIA: General Endotracheal  EBL: 250cc  SPECIMENS: None  DRAINS: None  COMPLICATIONS: ventral durotomy @ C3-4  CONDITION: Hemodynamically stable to PACU  HISTORY: Ricardo Rivera is a 81 y.o. initially seen in the outpatient neurosurgery clinic nearly a year ago with neck and arm pain. MRI was completed which demonstrated multilevel cervical spondylosis with spinal cord compression, worst at C3-4, C4-5, C5-6, and C6-7. The patient attempted a course of conservative treatments which were unsuccessful, and the patient's symptoms progressed. He therefore elected to proceed with surgical decompression. The risks and benefits of the surgery were reviewed in detail with the patient and his family. After all questions were answered, informed consent was obtained.  PROCEDURE IN DETAIL: The patient was brought to the operating room and transferred to the operative table. After induction of general anesthesia, the patient was positioned on the operative table in the supine position with all pressure points meticulously padded. The skin of the neck was then prepped and draped in the usual sterile fashion.  After timeout was conducted, the skin was infiltrated with local anesthetic. Skin incision was then made sharply and Bovie electrocautery was used to dissect the subcutaneous tissue until the  platysma was identified. The platysma was then divided and undermined. The sternocleidomastoid muscle was then identified and, utilizing natural fascial planes in the neck, the prevertebral fascia was identified and the carotid sheath was retracted laterally and the trachea and esophagus retracted medially. Again using fluoroscopy, the correct disc spaces were identified. Bovie electrocautery was used to dissect in the subperiosteal plane and elevate the bilateral longus coli muscles. Table mounted retractors were then placed. At this point, the microscope was draped and brought into the field, and the remainder of the case was done under the microscope using microdissecting technique.  The C4-5 disc space was incised sharply and rongeurs were use to initially complete a discectomy. The high-speed drill was then used to complete discectomy until the posterior annulus was identified and removed and the posterior longitudinal ligament was identified. Using a nerve hook, the PLL was elevated, and Kerrison rongeurs were used to remove the posterior longitudinal ligament and the ventral thecal sac was identified. Using a combination of curettes and rongeurs, complete decompression of the thecal sac and exiting nerve roots at this level was completed, and verified using micro-nerve hook. The central portion of the canal did appear to be fairly stenotic due to a combination of bony osteophytes, and spondylotic tissue.  At this point, a 7 mm interbody cage was sized and packed with morcellized bone allograft. This was then inserted and tapped into place.  Attention was then turned to the C3-4 level. In a similar fashion, discectomy was completed initially with curettes and rongeurs, and completed with the drill. The PLL was again identified and noted to be essentially completely calcified. There did appear to be a fair amount of spinal cord compression at this level due to the ossification of the posterior  longitudinal ligament. In dissection of the PLL away from the dura, a durotomy was created on the right side of the ventral surface of the thecal sac. Using Kerrison rongeurs, decompression of the spinal cord and exiting roots at the C3-4 level was completed and confirmed with a dissector. The durotomy was then covered with a small piece of DuraGen, and covered with a layer of DuraSeal.  A 7 mm interbody cage was then sized and filled with bone allograft, and tapped into place. Position of the interbody devices was then confirmed with fluoroscopy.  Attention was then turned to the C5-6 level. In a similar fashion, discectomy was completed initially with curettes and rongeurs, and completed with the drill. The PLL was again identified, elevated and incised. Using Kerrison rongeurs, decompression of the spinal cord and exiting roots at the C5-6 level was completed and confirmed with a dissector.  A 6 mm interbody cage was then sized and filled with bone allograft, and tapped into place. Position of the interbody devices was then confirmed with fluoroscopy.  Finally, attention was turned to the C6-7 level. Again, in a similar fashion, discectomy was initially completed using curettes. The high-speed drill was then used to complete the discectomy until the posterior longitudinal ligament was identified. This was then elevated using a micro-nerve hook, and Kerrison rongeurs were used to complete the decompression at the C6-7 level. Good decompression of the thecal sac and the exiting C7 nerve roots was confirmed using a microdissector.  A 6 mm interbody cage was then sized, filled with bone allograft, and tapped into place.  After placement of the intervertebral devices, the anterior cervical plate was selected, and placed across the interspaces. Using a high-speed drill, the cortex of the cervical vertebral bodies was punctured, and screws inserted in the C3, C4, C5, C6, and C7 levels. Final fluoroscopic  images in AP and lateral projections were taken to confirm good hardware placement.  At this point, after all counts were verified to be correct, meticulous hemostasis was secured using a combination of bipolar electrocautery and passive hemostatics. The platysma muscle was then closed using interrupted 3-0 Vicryl sutures, and the skin was closed with a interrupted subcuticular stitch. Sterile dressings were then applied and the drapes removed.  The patient tolerated the procedure well and was extubated in the room and taken to the postanesthesia care unit in stable condition.

## 2014-09-23 NOTE — Anesthesia Procedure Notes (Signed)
Procedure Name: Intubation Date/Time: 09/23/2014 7:42 AM Performed by: Roney MansSMITH, Lysle Yero P Pre-anesthesia Checklist: Patient identified, Timeout performed, Emergency Drugs available, Suction available and Patient being monitored Patient Re-evaluated:Patient Re-evaluated prior to inductionOxygen Delivery Method: Circle system utilized Preoxygenation: Pre-oxygenation with 100% oxygen Intubation Type: IV induction Ventilation: Mask ventilation without difficulty and Oral airway inserted - appropriate to patient size Laryngoscope size: Glidescope -- adult large size. Grade View: Grade I Tube type: Oral Tube size: 7.5 mm Number of attempts: 1 Airway Equipment and Method: Video-laryngoscopy,  Rigid stylet and Oral airway Placement Confirmation: ETT inserted through vocal cords under direct vision,  breath sounds checked- equal and bilateral and positive ETCO2 Secured at: 23 cm Tube secured with: Tape Dental Injury: Teeth and Oropharynx as per pre-operative assessment

## 2014-09-23 NOTE — Progress Notes (Signed)
Pt became agitated when awakening in his room, removed his IVs, and his Foley. He was given Haldol. Currently much more calm.  EXAM:  BP 146/103 mmHg  Pulse 144  Temp(Src) 98 F (36.7 C) (Oral)  Resp 22  Ht 5\' 11"  (1.803 m)  Wt 86.5 kg (190 lb 11.2 oz)  BMI 26.61 kg/m2  SpO2 94%  Awake, alert, disoriented  Speech fluent CN grossly intact  Moving all extremities with good strength  IMPRESSION:  81 y.o. male s/p 4 level ACDF, with agitation. Likely related to anesthesia and confusion, does not appear related to hypoxia. SpO2 currently normal  PLAN: - Cont to monitor continuous pulse-ox.  - PRN xanax

## 2014-09-23 NOTE — Transfer of Care (Signed)
Immediate Anesthesia Transfer of Care Note  Patient: Ricardo Rivera  Procedure(s) Performed: Procedure(s): CERVICAL THREE-FOUR,CERVICAL FOUR-FIVE,CERVICAL FIVE-SIX,CERVICAL SIX-SEVEN,ANTERIOR CERVICAL DECOMPRESSION WITH FUSION INTERBODY PROSTHESIS PLATING AND BONEGRAFT. (N/A)  Patient Location: PACU  Anesthesia Type:General  Level of Consciousness: awake, patient cooperative and lethargic  Airway & Oxygen Therapy: Patient Spontanous Breathing and Patient connected to nasal cannula oxygen  Post-op Assessment: Report given to RN, Post -op Vital signs reviewed and stable and Patient moving all extremities X 4  Post vital signs: Reviewed and stable  Complications: No apparent anesthesia complications

## 2014-09-24 ENCOUNTER — Inpatient Hospital Stay (HOSPITAL_COMMUNITY): Payer: Medicare Other | Admitting: Anesthesiology

## 2014-09-24 ENCOUNTER — Encounter (HOSPITAL_COMMUNITY)
Admission: RE | Disposition: A | Discharge status: Nursing Home (Includes ICF) | Payer: Self-pay | Source: Ambulatory Visit | Attending: Neurosurgery

## 2014-09-24 ENCOUNTER — Inpatient Hospital Stay (HOSPITAL_COMMUNITY): Payer: Medicare Other

## 2014-09-24 DIAGNOSIS — M4712 Other spondylosis with myelopathy, cervical region: Principal | ICD-10-CM

## 2014-09-24 DIAGNOSIS — I959 Hypotension, unspecified: Secondary | ICD-10-CM

## 2014-09-24 DIAGNOSIS — J9600 Acute respiratory failure, unspecified whether with hypoxia or hypercapnia: Secondary | ICD-10-CM

## 2014-09-24 DIAGNOSIS — R451 Restlessness and agitation: Secondary | ICD-10-CM

## 2014-09-24 LAB — POCT I-STAT 3, ART BLOOD GAS (G3+)
Acid-Base Excess: 1 mmol/L (ref 0.0–2.0)
Bicarbonate: 23.9 mEq/L (ref 20.0–24.0)
O2 SAT: 100 %
PCO2 ART: 32.2 mmHg — AB (ref 35.0–45.0)
PO2 ART: 197 mmHg — AB (ref 80.0–100.0)
Patient temperature: 98.6
TCO2: 25 mmol/L (ref 0–100)
pH, Arterial: 7.479 — ABNORMAL HIGH (ref 7.350–7.450)

## 2014-09-24 LAB — GLUCOSE, CAPILLARY
GLUCOSE-CAPILLARY: 131 mg/dL — AB (ref 70–99)
Glucose-Capillary: 151 mg/dL — ABNORMAL HIGH (ref 70–99)
Glucose-Capillary: 154 mg/dL — ABNORMAL HIGH (ref 70–99)
Glucose-Capillary: 115 mg/dL — ABNORMAL HIGH (ref 70–99)

## 2014-09-24 LAB — URINE MICROSCOPIC-ADD ON

## 2014-09-24 LAB — URINALYSIS, ROUTINE W REFLEX MICROSCOPIC
BILIRUBIN URINE: NEGATIVE
Glucose, UA: NEGATIVE mg/dL
Ketones, ur: NEGATIVE mg/dL
LEUKOCYTES UA: NEGATIVE
NITRITE: NEGATIVE
Protein, ur: 30 mg/dL — AB
Specific Gravity, Urine: 1.014 (ref 1.005–1.030)
Urobilinogen, UA: 0.2 mg/dL (ref 0.0–1.0)
pH: 5.5 (ref 5.0–8.0)

## 2014-09-24 LAB — TRIGLYCERIDES: Triglycerides: 121 mg/dL (ref <150)

## 2014-09-24 SURGERY — ANTERIOR CERVICAL DECOMPRESSION FOR EPIDURAL ABSCESS
Anesthesia: General

## 2014-09-24 MED ORDER — DEXAMETHASONE SODIUM PHOSPHATE 4 MG/ML IJ SOLN
4.0000 mg | Freq: Four times a day (QID) | INTRAMUSCULAR | Status: DC
  Administered 2014-09-24: 4 mg via INTRAVENOUS
  Filled 2014-09-24 (×4): qty 1

## 2014-09-24 MED ORDER — HALOPERIDOL LACTATE 5 MG/ML IJ SOLN
2.0000 mg | Freq: Once | INTRAMUSCULAR | Status: AC
  Administered 2014-09-24: 2 mg via INTRAMUSCULAR
  Filled 2014-09-24: qty 1

## 2014-09-24 MED ORDER — LABETALOL HCL 5 MG/ML IV SOLN
INTRAVENOUS | Status: AC
  Filled 2014-09-24: qty 4

## 2014-09-24 MED ORDER — SODIUM CHLORIDE 0.9 % IV SOLN
25.0000 ug/h | INTRAVENOUS | Status: DC
  Administered 2014-09-24 – 2014-09-25 (×2): 50 ug/h via INTRAVENOUS
  Filled 2014-09-24 (×3): qty 50

## 2014-09-24 MED ORDER — FENTANYL CITRATE 0.05 MG/ML IJ SOLN
INTRAMUSCULAR | Status: AC
  Administered 2014-09-24: 100 ug
  Filled 2014-09-24: qty 2

## 2014-09-24 MED ORDER — SODIUM CHLORIDE 0.9 % IV SOLN
INTRAVENOUS | Status: DC
  Administered 2014-09-24: 21:00:00 via INTRAVENOUS

## 2014-09-24 MED ORDER — BIOTENE DRY MOUTH MT LIQD
15.0000 mL | OROMUCOSAL | Status: DC
  Administered 2014-09-24 – 2014-09-25 (×4): 15 mL via OROMUCOSAL

## 2014-09-24 MED ORDER — NALOXONE HCL 0.4 MG/ML IJ SOLN
INTRAMUSCULAR | Status: AC
Start: 2014-09-24 — End: 2014-09-24
  Administered 2014-09-24: 0.4 mg
  Filled 2014-09-24: qty 1

## 2014-09-24 MED ORDER — CHLORHEXIDINE GLUCONATE 0.12 % MT SOLN
15.0000 mL | Freq: Two times a day (BID) | OROMUCOSAL | Status: DC
  Administered 2014-09-24: 15 mL via OROMUCOSAL
  Filled 2014-09-24: qty 15

## 2014-09-24 MED ORDER — KCL IN DEXTROSE-NACL 20-5-0.9 MEQ/L-%-% IV SOLN
INTRAVENOUS | Status: DC
Start: 2014-09-24 — End: 2014-09-28
  Administered 2014-09-24: 14:00:00 via INTRAVENOUS
  Administered 2014-09-25: 40 mL/h via INTRAVENOUS
  Administered 2014-09-26 – 2014-09-27 (×2): via INTRAVENOUS
  Filled 2014-09-24 (×7): qty 1000

## 2014-09-24 MED ORDER — PANTOPRAZOLE SODIUM 40 MG IV SOLR
40.0000 mg | Freq: Every day | INTRAVENOUS | Status: DC
  Administered 2014-09-24 – 2014-09-28 (×5): 40 mg via INTRAVENOUS
  Filled 2014-09-24 (×5): qty 40

## 2014-09-24 MED ORDER — PROPOFOL 10 MG/ML IV EMUL
0.0000 ug/kg/min | INTRAVENOUS | Status: DC
  Administered 2014-09-24: 5 ug/kg/min via INTRAVENOUS
  Administered 2014-09-24: 15 ug/kg/min via INTRAVENOUS
  Administered 2014-09-25: 35 ug/kg/min via INTRAVENOUS
  Administered 2014-09-25: 25 ug/kg/min via INTRAVENOUS
  Administered 2014-09-25: 30 ug/kg/min via INTRAVENOUS
  Administered 2014-09-25 – 2014-09-26 (×3): 35 ug/kg/min via INTRAVENOUS
  Filled 2014-09-24 (×7): qty 100

## 2014-09-24 MED ORDER — PROPOFOL 10 MG/ML IV EMUL
INTRAVENOUS | Status: AC
  Filled 2014-09-24: qty 100

## 2014-09-24 MED ORDER — FENTANYL CITRATE 0.05 MG/ML IJ SOLN: 50.0000 ug | Freq: Once | INTRAMUSCULAR | Status: DC

## 2014-09-24 MED ORDER — FENTANYL CITRATE 0.05 MG/ML IJ SOLN
INTRAMUSCULAR | Status: AC
  Filled 2014-09-24: qty 5

## 2014-09-24 MED ORDER — POTASSIUM CHLORIDE 20 MEQ/15ML (10%) PO SOLN: 20.0000 meq | Freq: Once | ORAL | Status: DC

## 2014-09-24 MED ORDER — DEXAMETHASONE SODIUM PHOSPHATE 4 MG/ML IJ SOLN
4.0000 mg | Freq: Four times a day (QID) | INTRAMUSCULAR | Status: DC
  Administered 2014-09-24 – 2014-10-01 (×30): 4 mg via INTRAVENOUS
  Filled 2014-09-24 (×36): qty 1

## 2014-09-24 MED ORDER — FUROSEMIDE 10 MG/ML IJ SOLN
40.0000 mg | Freq: Once | INTRAMUSCULAR | Status: AC
  Administered 2014-09-24: 40 mg via INTRAVENOUS
  Filled 2014-09-24: qty 4

## 2014-09-24 MED ORDER — FENTANYL BOLUS VIA INFUSION
25.0000 ug | INTRAVENOUS | Status: DC | PRN
  Administered 2014-09-24 – 2014-09-26 (×6): 25 ug via INTRAVENOUS
  Filled 2014-09-24: qty 25

## 2014-09-24 MED ORDER — DEXAMETHASONE SODIUM PHOSPHATE 10 MG/ML IJ SOLN
4.0000 mg | Freq: Four times a day (QID) | INTRAMUSCULAR | Status: DC
  Administered 2014-09-24: 4 mg via INTRAVENOUS
  Filled 2014-09-24 (×5): qty 0.4

## 2014-09-24 NOTE — Progress Notes (Signed)
PT Cancellation Note  Patient Details Name: Ricardo AsalDwight L Nordhoff MRN: [id number] DOB: 07/15/1932   Cancelled Treatment:    Reason Eval/Treat Not Completed: Medical issues which prohibited therapy;Patient not medically ready  Received PT orders dated 09/23/14.  Per chart review, noted patient had code incident with subsequent intubation early this morning.  Deferred PT evaluation at this time. Please request updated therapy orders once patient medically stable in order for therapy to formally evaluate. Thank you!  Stephanie AcreKristen M RuthvenSoth, PT [phone]  Symia Herdt 09/24/2014, 9:26 AM

## 2014-09-24 NOTE — Progress Notes (Signed)
M.D returned call with new orders

## 2014-09-24 NOTE — Progress Notes (Addendum)
Pt seen and examined. Events of early this am reviewed. Pt apparently was slightly agitated overnight, but became increasingly so early this am, and then had drop in SpO2. Did have Rapid response was called, and per anesthesia, able to intubate with glidescope relatively easily without obvious tracheal deviation. Pt then transferred to ICU.  EXAM: Temp:  [97.9 F (36.6 C)-98.6 F (37 C)] 98.2 F (36.8 C) (03/26 0431) Pulse Rate:  [99-144] 107 (03/26 0700) Resp:  [0-47] 27 (03/26 0700) BP: (96-162)/(55-103) 96/56 mmHg (03/26 0700) SpO2:  [39 %-98 %] 97 % (03/26 0700) FiO2 (%):  [100 %] 100 % (03/26 0658) Weight:  [86.5 kg (190 lb 11.2 oz)] 86.5 kg (190 lb 11.2 oz) (03/25 1435) Intake/Output      03/25 0701 - 03/26 0700 03/26 0701 - 03/27 0700   I.V. (mL/kg) 2800 (32.4)    Total Intake(mL/kg) 2800 (32.4)    Urine (mL/kg/hr) 1840 (0.9)    Blood 200 (0.1)    Total Output 2040     Net +760            On low-dose propofol: Awake, alert Breathing spontaneously Moving all extremities with good strength Neck diffusely tense, trachea appears near midline  LABS: Lab Results  Component Value Date   CREATININE 0.95 09/23/2014   BUN 15 09/23/2014   NA 135 09/23/2014   K 4.3 09/23/2014   CL 102 09/23/2014   CO2 23 09/23/2014   Lab Results  Component Value Date   WBC 7.7 09/23/2014   HGB 12.7* 09/23/2014   HCT 36.9* 09/23/2014   MCV 86.2 09/23/2014   PLT 242 09/23/2014    IMPRESSION: - 81 y.o. male POD#1 s/p 4 level ACDF with respiratory distress and neck swelling. Now with protected airway. Unclear if this is from generalized edema or neck hematoma.  PLAN: - Given now stable airway and vital signs, will get STAT CT neck. If large hematoma is seen, will likely explore neck.  - Decadron 4mg  IV q6hrs

## 2014-09-24 NOTE — Progress Notes (Signed)
Patient ID: Ricardo Rivera, male   DOB: 12/11/1932, 81 y.o.   MRN: [id number] Was paged at 6:25 that patient was in resp distress and that rapid response was present. Anesthesia was called to intubate the patient. Earlier at 3:42 I was paged that patient had become more agitated but saO2 was in the 90's on 2 liter O2. Advised to give Haldol which had been ordered for earlier episode of agitation.  Per nurse he had been mildly agitated all night  Long.  Spoke with anesthesia who said patient was easy to intubate but swollen. Not impressed with trach deviation. Exam shows diffuse anterior neck edema but no evidence for trachial deviation. Discussed with Dr. Conchita ParisNundkumar... Called team in for surgical exploration, but may reconsider.

## 2014-09-24 NOTE — Code Documentation (Signed)
CODE BLUE NOTE  Patient Name: Ricardo Rivera   MRN: [id number]   Date of Birth/ Sex: 01/11/1933 , male      Admission Date: 09/23/2014  Attending Provider: Lisbeth RenshawNeelesh Nundkumar, MD  Primary Diagnosis: <principal problem not specified>    Indication: Pt was in his usual state of health until this AM, when he was noted to be in resp failure. Code blue was subsequently called. At the time of arrival on scene, ACLS protocol was underway.    Technical Description:  - CPR performance duration:  0  minutes  - Was defibrillation or cardioversion used? No   - Was external pacer placed? Yes  - Was patient intubated pre/post CPR? Yes    Medications Administered: Y = Yes; Blank = No Amiodarone    Atropine    Calcium    Epinephrine    Lidocaine    Magnesium    Norepinephrine    Phenylephrine    Sodium bicarbonate    Vasopressin     Narcan, labetalol given.   Post CPR evaluation:  - Final Status - Was patient successfully resuscitated ? Yes - What is current rhythm? Sinus tachy - What is current hemodynamic status? stable   Miscellaneous Information:  - Labs sent, including: ABG  - Primary team notified?  Yes  - Family Notified? This provider is unsure  - Additional notes/ transfer status: Transferred to ICU   Kathee DeltonIan D McKeag, MD  09/24/2014, 6:49 AM

## 2014-09-24 NOTE — Progress Notes (Signed)
Pt transported to CT on vent with no complications. Vitals remained stable throughout.

## 2014-09-24 NOTE — Progress Notes (Signed)
At approx 06:30 patients sats noted to be less than 90% alerted by continous O2 sat beeping at bedside. Patient desating sats 84 % on 2lt of oxygen Patient noted to have labored breathing Rapid response called  To bedside. M.D notified and patients family called .

## 2014-09-24 NOTE — Progress Notes (Signed)
INITIAL NUTRITION ASSESSMENT  DOCUMENTATION CODES Per approved criteria  -Not Applicable   INTERVENTION: If unable to extubate, recommend initiating TF Vital HP  25 ml/h and Prostat 30 ml BID on day 1; on day 2 d/c Prostat, increase to goal rate of 65 ml/h (1560 ml per day) to provide 1560 kcals (+206 from propofol) , 137 gm protein, 1304 ml free water daily.  NUTRITION DIAGNOSIS: Inadequate oral intake related to inability to eat as evidenced by NPO status  Goals: Extubation and resumption of diet within 24 hours  TF initiation within 24 hours  Monitor:   Vent status, propofol rate, TF orders, labs    Reason for Assessment: New vent  81 y.o. male  Admitting Dx: <principal problem not specified>  Patient is currently intubated on ventilator support MV: 9 L/min Temp (24hrs), Avg:98.1 F (36.7 C), Min:97.9 F (36.6 C), Max:98.3 F (36.8 C)  Propofol: 7.8 ml/hr (provides 206 kcals daily)  ASSESSMENT: 81 year old man pmhx: HTN, HLD, SOB, who presented initially with a primary complaint of right-sided hand pain. Pt decompensated s/p spinal decompression   Pt intubated with no family in room.  Nutrition Focused Physical Exam:  Subcutaneous Fat:  Orbital Region: well nourished Upper Arm Region: n/a Thoracic and Lumbar Region: n/a  Muscle:  Temple Region: mild loss Clavicle Bone Region: well nourished Clavicle and Acromion Bone Region: well nourished Scapular Bone Region: n/a Dorsal Hand: n/a Patellar Region: well nourished Anterior Thigh Region: well nourished Posterior Calf Region: well nourished  Edema: n/a  Height: Ht Readings from Last 1 Encounters:  09/23/14 5\' 11"  (1.803 m)    Weight: Wt Readings from Last 1 Encounters:  09/23/14 190 lb 11.2 oz (86.5 kg)  187 lbs (85 kg) dosing weight  Ideal Body Weight: 172 lbs  % Ideal Body Weight: 110 %  Wt Readings from Last 10 Encounters:  09/23/14 190 lb 11.2 oz (86.5 kg)  01/01/14 180 lb (81.647 kg)   01/14/11 190 lb (86.183 kg)    Usual Body Weight: uknown  BMI:  Body mass index is 26.61 kg/(m^2).  Estimated Nutritional Needs: Kcal: 1741 Protein: 127-147 (1.5-1.7 kcal/kg) Fluid: per md  Skin: Surgical incisions  Diet Order: Diet clear liquid Room service appropriate?: Yes; Fluid consistency:: Thin  EDUCATION NEEDS: -No education needs identified at this time   Intake/Output Summary (Last 24 hours) at 09/24/14 1317 Last data filed at 09/24/14 1200  Gross per 24 hour  Intake 1076.2 ml  Output   1255 ml  Net -178.8 ml    Last BM: uknown  Labs:   Recent Labs Lab 09/23/14 0706  NA 135  K 4.3  CL 102  CO2 23  BUN 15  CREATININE 0.95  CALCIUM 9.4  GLUCOSE 92    CBG (last 3)   Recent Labs  09/24/14 0622 09/24/14 0809 09/24/14 1230  GLUCAP 151* 154* 115*    Scheduled Meds: . brimonidine  1 drop Both Eyes TID  . cholecalciferol  2,000 Units Oral Daily  . dexamethasone  4 mg Intravenous 4 times per day  . docusate sodium  100 mg Oral BID  . fentaNYL  50 mcg Intravenous Once  . gemfibrozil  600 mg Oral BID AC  . heparin subcutaneous  5,000 Units Subcutaneous 3 times per day  . labetalol      . latanoprost  1 drop Both Eyes QHS  . losartan  50 mg Oral Daily  . multivitamin with minerals  1 tablet Oral Daily  . oxybutynin  5 mg Oral Daily  . pantoprazole (PROTONIX) IV  40 mg Intravenous Daily  . senna  1 tablet Oral BID  . sodium chloride  3 mL Intravenous Q12H    Continuous Infusions: . sodium chloride 75 mL/hr at 09/24/14 1222  . sodium chloride    . fentaNYL infusion INTRAVENOUS 50 mcg/hr (09/24/14 1200)  . propofol 15 mcg/kg/min (09/24/14 1200)    Past Medical History  Diagnosis Date  . Hypertension   . Shortness of breath dyspnea   . Arthritis   . Hyperlipemia   . Frequent urination   . Nocturia     Past Surgical History  Procedure Laterality Date  . Spinal surgery      fusion  . Spinal fusion  1986  . Shoulder surgies  Bilateral   . Lower back  1970  . Cholecystectomy  1998    Christophe Louis RD, LDN Nutrition Pager: [phone] 09/24/2014 1:17 PM

## 2014-09-24 NOTE — Progress Notes (Signed)
Patient continues to be agitated pulling neck collar off and attempting to get out of bed.. Call to M.D. Patients daughter Noreene LarssonJill also made aware of patients increased agitation.

## 2014-09-24 NOTE — Procedures (Signed)
Asked by Dr Danielle DessElsner  to emergently intubate patient for airway obstruction. Chart reviewed, patient identified, and time-out performed. No drugs used.  Larygoscopy performed by L Paxton  CRNA using a Macintosh3 Glidescope blade and a number 7.5 subglottic tube, taped at 23cm at the lip.  Good bilateral breath sounds heard, ETCO2 positive.  Airway to RT, sedation Fentanyl 100mcg given, Propofol 100mg  IV. Labetalol 5mg  given IV for hypertension and tachycardia from atropine and epinephrine given preintubation. Start: 06:30 End: 06:45 Arta BruceKevin Dajohn Ellender, MD

## 2014-09-24 NOTE — Anesthesia Preprocedure Evaluation (Signed)
Anesthesia Evaluation  Patient identified by MRN, date of birth, ID band Patient awake    Reviewed: Allergy & Precautions, NPO status , Patient's Chart, lab work & pertinent test results  Airway Mallampati: Intubated  TM Distance: >3 FB Neck ROM: Full    Dental no notable dental hx.    Pulmonary Current Smoker,  breath sounds clear to auscultation  + decreased breath sounds      Cardiovascular hypertension, Rhythm:Regular Rate:Normal     Neuro/Psych negative neurological ROS  negative psych ROS   GI/Hepatic negative GI ROS, Neg liver ROS,   Endo/Other  negative endocrine ROS  Renal/GU negative Renal ROS  negative genitourinary   Musculoskeletal negative musculoskeletal ROS (+)   Abdominal   Peds negative pediatric ROS (+)  Hematology negative hematology ROS (+)   Anesthesia Other Findings   Reproductive/Obstetrics negative OB ROS                             Anesthesia Physical Anesthesia Plan  ASA: III and emergent  Anesthesia Plan: General   Post-op Pain Management:    Induction: Inhalational  Airway Management Planned:   Additional Equipment:   Intra-op Plan:   Post-operative Plan: Post-operative intubation/ventilation  Informed Consent: I have reviewed the patients History and Physical, chart, labs and discussed the procedure including the risks, benefits and alternatives for the proposed anesthesia with the patient or authorized representative who has indicated his/her understanding and acceptance.   Dental advisory given  Plan Discussed with: CRNA and Surgeon  Anesthesia Plan Comments:         Anesthesia Quick Evaluation

## 2014-09-24 NOTE — Consult Note (Signed)
PULMONARY / CRITICAL CARE MEDICINE   Name: Ricardo Rivera MRN: [id number] DOB: [birth year]    ADMISSION DATE:  09/23/2014 CONSULTATION DATE:  09/24/14  REFERRING MD :  Dr. Conchita Paris   CHIEF COMPLAINT:  VDRF   INITIAL PRESENTATION: 81 y/o M with a PMH of R sided hand / arm pain admitted on 3/25 for planned ACDF.  Early am 3/26, the patient developed agitation, hypoxia and respiratory distress.  He was emergently intubated per anesthesia (easy intubation but swollen).  PCCM consulted for ICU vent management.   STUDIES:  3/26  CT Neck >> post op prevertebral / retropharyngeal fluid & R neck hematoma, s/p C3-C7 ACDF, ETT in good position, lung apices with acute pulmonary edema  SIGNIFICANT EVENTS: 3/25  Admit for planned ACDF in setting of R sided hand / arm pain  3/26  Respiratory decompensation on floor, intubated per anesthesia   HISTORY OF PRESENT ILLNESS:  81 y/o M with a PMH HTN, HLD, arthritis, nocturia, prior spinal fusion (1986) and R sided hand / arm pain admitted on 3/25 for planned ACDF.  The patient was admitted to the floor post-operatively.  Early am 3/26, the patient developed agitation, hypoxia and respiratory distress.  A code blue was called (no loss of pulse).  He was emergently intubated per anesthesia (notes reflect an easy intubation but swollen).  He was transferred to ICU.  CT of the neck was obtained post intubation with noted post op prevertebral / retropharyngeal fluid & R neck hematoma, s/p C3-C7 ACDF, ETT in good position, lung apices with acute pulmonary edema.  PCCM consulted for ICU vent management.   PAST MEDICAL HISTORY :   has a past medical history of Hypertension; Shortness of breath dyspnea; Arthritis; Hyperlipemia; Frequent urination; and Nocturia.  has past surgical history that includes spinal surgery; Spinal fusion (1986); Shoulder surgies (Bilateral); Lower back (1970); and Cholecystectomy (1998).   HOME MEDICATIONS: Prior to Admission medications    Medication Sig Start Date End Date Taking? Authorizing Provider  acetaminophen (TYLENOL) 500 MG tablet Take 500 mg by mouth every 6 (six) hours as needed for mild pain or moderate pain.    Yes Historical Provider, MD  ALPRAZolam Prudy Feeler) 0.5 MG tablet Take 0.5 mg by mouth at bedtime as needed for sleep.    Yes Historical Provider, MD  aspirin 81 MG tablet Take 81 mg by mouth daily.     Yes Historical Provider, MD  brimonidine (ALPHAGAN P) 0.1 % SOLN Place 1 drop into both eyes 3 (three) times daily.    Yes Historical Provider, MD  Cholecalciferol (VITAMIN D) 2000 UNITS CAPS Take 2,000 Units by mouth daily.    Yes Historical Provider, MD  gemfibrozil (LOPID) 600 MG tablet Take 600 mg by mouth 2 (two) times daily before a meal.     Yes Historical Provider, MD  HYDROcodone-acetaminophen (NORCO/VICODIN) 5-325 MG per tablet Take 1-2 tablets by mouth every 4 (four) hours as needed. Patient taking differently: Take 1 tablet by mouth every 4 (four) hours as needed for moderate pain.  09/16/14  Yes Lisbeth Renshaw, MD  latanoprost (XALATAN) 0.005 % ophthalmic solution Place 1 drop into both eyes at bedtime.   Yes Historical Provider, MD  losartan (COZAAR) 50 MG tablet Take 50 mg by mouth daily.     Yes Historical Provider, MD  Multiple Vitamins-Minerals (CENTRUM SILVER ADULT 50+ PO) Take 1 tablet by mouth daily.   Yes Historical Provider, MD  oxybutynin (DITROPAN) 5 MG tablet Take 5 mg by  mouth daily.   Yes Historical Provider, MD  pantoprazole (PROTONIX) 40 MG tablet Take 40 mg by mouth daily.   Yes Historical Provider, MD  gabapentin (NEURONTIN) 300 MG capsule Take 1 capsule (300 mg total) by mouth at bedtime. Patient not taking: Reported on 09/01/2014 01/01/14   Nelva Nayobert Beaton, MD   Allergies  Allergen Reactions  . Flexeril [Cyclobenzaprine] Other (See Comments)    Hallucinations  . Tramadol Other (See Comments)    Hallunications   FAMILY HISTORY:  indicated that his mother is deceased. He indicated  that his father is deceased.    SOCIAL HISTORY:  reports that he has been smoking.  He has never used smokeless tobacco. He reports that he does not drink alcohol or use illicit drugs.  REVIEW OF SYSTEMS:  Unable to complete as patient is altered on vent.    SUBJECTIVE:   VITAL SIGNS: Temp:  [97.9 F (36.6 C)-98.3 F (36.8 C)] 98.2 F (36.8 C) (03/26 0431) Pulse Rate:  [86-144] 87 (03/26 1200) Resp:  [0-27] 16 (03/26 1218) BP: (70-162)/(44-103) 110/69 mmHg (03/26 1218) SpO2:  [39 %-100 %] 97 % (03/26 1218) FiO2 (%):  [50 %-100 %] 50 % (03/26 1219) Weight:  [190 lb 11.2 oz (86.5 kg)] 190 lb 11.2 oz (86.5 kg) (03/25 1435)   HEMODYNAMICS:     VENTILATOR SETTINGS: Vent Mode:  [-] PRVC FiO2 (%):  [50 %-100 %] 50 % Set Rate:  [14 bmp] 14 bmp Vt Set:  [500 mL] 500 mL PEEP:  [5 cmH20] 5 cmH20 Plateau Pressure:  [11 cmH20] 11 cmH20   INTAKE / OUTPUT:  Intake/Output Summary (Last 24 hours) at 09/24/14 1318 Last data filed at 09/24/14 1200  Gross per 24 hour  Intake 1076.2 ml  Output   1255 ml  Net -178.8 ml   PHYSICAL EXAMINATION: General:  wdwn adult male in NAD Neuro:  Sedate, no distress, C-Collar in place  HEENT:  OETT, mm pink/moist, anterior bruising  Cardiovascular:  s1s2 rrr, no m/r/g Lungs:  resp's even/non-labored on vent, lungs bilaterally diminished, faint basilar crackles  Abdomen:  Flat, soft, bsx4 active  Musculoskeletal:  No acute deformities  Skin:  Warm/dry, no edema   LABS:  CBC  Recent Labs Lab 09/23/14 0706  WBC 7.7  HGB 12.7*  HCT 36.9*  PLT 242   Coag's No results for input(s): APTT, INR in the last 168 hours.   BMET  Recent Labs Lab 09/23/14 0706  NA 135  K 4.3  CL 102  CO2 23  BUN 15  CREATININE 0.95  GLUCOSE 92   Electrolytes  Recent Labs Lab 09/23/14 0706  CALCIUM 9.4   Sepsis Markers No results for input(s): LATICACIDVEN, PROCALCITON, O2SATVEN in the last 168 hours.   ABG  Recent Labs Lab 09/24/14 0630  09/24/14 0945  PHART 7.220* 7.479*  PCO2ART CRITICAL RESULT CALLED TO, READ BACK BY AND VERIFIED WITH: 32.2*  PO2ART 182.0* 197.0*   Liver Enzymes No results for input(s): AST, ALT, ALKPHOS, BILITOT, ALBUMIN in the last 168 hours.   Cardiac Enzymes No results for input(s): TROPONINI, PROBNP in the last 168 hours.   Glucose  Recent Labs Lab 09/23/14 2149 09/24/14 0622 09/24/14 0809 09/24/14 1230  GLUCAP 112* 151* 154* 115*   Imaging Dg Cervical Spine 2-3 Views  09/23/2014   CLINICAL DATA:  ACDF, C3-4, C4-5, C5-6, and C6-7.  EXAM: DG C-ARM 61-120 MIN; CERVICAL SPINE - 2-3 VIEW  : COMPARISON:  Cervical spine MRI 01/14/2014  FINDINGS: Discectomy  at C3-4, C4-5, C5-6, and C6-7. There is a ventral plate and screw fixation which is in good position. The intervertebral grafts are well located. There is no appreciable endplate fracture. Open wound with packing. Endotracheal and orogastric tubes.  IMPRESSION: C3-4, C4-5, C5-6, C6-7 ACDF with plate.  No adverse findings.   Electronically Signed   By: Marnee Spring M.D.   On: 09/23/2014 12:29   Dg C-arm 1-60 Min  09/23/2014   CLINICAL DATA:  ACDF, C3-4, C4-5, C5-6, and C6-7.  EXAM: DG C-ARM 61-120 MIN; CERVICAL SPINE - 2-3 VIEW  : COMPARISON:  Cervical spine MRI 01/14/2014  FINDINGS: Discectomy at C3-4, C4-5, C5-6, and C6-7. There is a ventral plate and screw fixation which is in good position. The intervertebral grafts are well located. There is no appreciable endplate fracture. Open wound with packing. Endotracheal and orogastric tubes.  IMPRESSION: C3-4, C4-5, C5-6, C6-7 ACDF with plate.  No adverse findings.   Electronically Signed   By: Marnee Spring M.D.   On: 09/23/2014 12:29   ASSESSMENT / PLAN:  NEUROLOGIC A:   Cervical Spondylosis with Myelopathy s/p Discectomy at C3-C7 P:   RASS goal: -2 Propofol for sedation  Fentanyl gtt for pain   PULMONARY OETT 3/26 >>  A: Acute Respiratory Failure - in setting of post-op discectomy  due to R neck hematoma  R Neck Hematoma  Pulmonary Edema  P:   PRVC, wean PEEP/FiO2 for sats > 92% Decadron per NSGY Likely wait 48 hours to allow for reduction of swelling  Lasix x1 Cuff leak test prior to serious consideration of extubation, would keep on full support at least for 48 hours.  CARDIOVASCULAR CVL None A:  HTN HLD P:  Hold home gemfibrozil, losartan Monitor hemodynamics  RENAL A:   No acute issues  P:   Monitor BMP Replace electrolytes as indicated  D5NS with 20 kcl @ 40 ml/hr until TF started   GASTROINTESTINAL A:   Vent Associated Dysphagia  P:   NPO  Hold OGT insertion given neck hematoma PPI  HEMATOLOGIC A:   R Neck Hematoma   P:  Monitor CBC SCD's for DVT   INFECTIOUS A:   Post-op ACDF - no evidence of acute infection P:   Cefazolin Q8x2 doses, per NSGY   ENDOCRINE A:   At Risk Hypoglycemia    P:   Add dextrose to IVF while NPO Once TF initiated or pt extubated, d/c dextrose CBG Q6   FAMILY  - Updates: No family at bedside on am rounds.   Canary Brim, NP-C [facility] Pgr: [phone] or [phone]  Above note edited in full.  Will need to keep intubated and sedated x48 hours.  Cuff leak test prior to serious consideration to extubation.  Do not insert OGT.  Hold TF.  IVF.  The patient is critically ill with multiple organ systems failure and requires high complexity decision making for assessment and support, frequent evaluation and titration of therapies, application of advanced monitoring technologies and extensive interpretation of multiple databases.   Critical Care Time devoted to patient care services described in this note is  35  Minutes. This time reflects time of care of this signee Dr Koren Bound. This critical care time does not reflect procedure time, or teaching time or supervisory time of PA/NP/Med student/Med Resident etc but could involve care discussion time.  Alyson Reedy, M.D. [facility]  Pulmonary/Critical Care Medicine. Pager: [phone]. After hours pager: [phone].

## 2014-09-24 NOTE — Progress Notes (Signed)
CT scan reviewed with radiology. Pt does have a postoperative likely hematoma at the operative site, lateral to the strap muscles. It is not in the prevertebral space, and while it may be placing some mass effect upon the aerodigestive tract, it appears the larger factor is diffuse swelling of the prevertebral space. Also incidentally seen is bilateral pulmonary edema. I therefore do not believe that the patient will significantly benefit from evacuation of a small hematoma when the larger problem is generalized soft tissue edema and possible pulmonary edema. We will therefore continue to monitor in the ICU, cont steroids, and I have asked the PCCM service to assist in management. I have spoken with the patient's wife and daughter about the situation and the plan above. All their questions were answered.

## 2014-09-24 NOTE — Progress Notes (Signed)
Resident increasingly agitated has continued to remove his neck colar and attempting to get out of bed unassisted.   Attempted to pull IV out again. Mittens applied to bilateral hands. Close monitoring continues.

## 2014-09-24 NOTE — Anesthesia Procedure Notes (Signed)
Date/Time: 09/24/2014 6:30 AM Performed by: Isidore MoosPAXTON, [name] Pre-anesthesia Checklist: Patient identified, Emergency Drugs available, Suction available and Patient being monitored Oxygen Delivery Method: Ambu bag Preoxygenation: Pre-oxygenation with 100% oxygen Ventilation: Mask ventilation without difficulty Grade View: Grade I Tube type: Subglottic suction tube Tube size: 7.5 mm Number of attempts: 2 Airway Equipment and Method: Video-laryngoscopy Placement Confirmation: ETT inserted through vocal cords under direct vision,  CO2 detector and breath sounds checked- equal and bilateral Secured at: 23 cm Tube secured with: Tape Dental Injury: Teeth and Oropharynx as per pre-operative assessment  Difficulty Due To: Difficulty was anticipated Comments: Patient s/p anterior cervical 3/25; neck edematous;  responded to Code Blue -> patient in respiratory distress on NRB 100%O2;SaO2 decreased to <70%. DL x 1 w/ [id number]->Miller2-> limited visualization; DL X 1 w/ GlideScope-> grade1 view. Intubated w/ Subglottic suction ETT 7.5 BS equal / bilateral-Ossey MD; + ETCO2

## 2014-09-25 DIAGNOSIS — T17908D Unspecified foreign body in respiratory tract, part unspecified causing other injury, subsequent encounter: Secondary | ICD-10-CM

## 2014-09-25 LAB — GLUCOSE, CAPILLARY
GLUCOSE-CAPILLARY: 104 mg/dL — AB (ref 70–99)
GLUCOSE-CAPILLARY: 139 mg/dL — AB (ref 70–99)
Glucose-Capillary: 143 mg/dL — ABNORMAL HIGH (ref 70–99)
Glucose-Capillary: 136 mg/dL — ABNORMAL HIGH (ref 70–99)

## 2014-09-25 MED ORDER — CHLORHEXIDINE GLUCONATE 0.12 % MT SOLN
15.0000 mL | Freq: Two times a day (BID) | OROMUCOSAL | Status: DC
  Administered 2014-09-25 – 2014-09-26 (×4): 15 mL via OROMUCOSAL
  Filled 2014-09-25 (×3): qty 15

## 2014-09-25 MED ORDER — CETYLPYRIDINIUM CHLORIDE 0.05 % MT LIQD
7.0000 mL | Freq: Four times a day (QID) | OROMUCOSAL | Status: DC
  Administered 2014-09-25 – 2014-09-26 (×5): 7 mL via OROMUCOSAL

## 2014-09-25 NOTE — Progress Notes (Signed)
Patient ID: Ricardo Rivera, male   DOB: 06/19/1933, 81 y.o.   MRN: [id number] Vital signs are stable Motor function is intact Continues to do reasonably well on ventilator Weaning per CCM Motor function appears strong in all 4 extremities Patient remains easily agitated and is requiring substantial sedation while on ventilator.

## 2014-09-25 NOTE — Progress Notes (Signed)
PULMONARY / CRITICAL CARE MEDICINE   Name: Ricardo Rivera MRN: [id number] DOB: 04/14/1933    ADMISSION DATE:  09/23/2014 CONSULTATION DATE:  09/24/14  REFERRING MD :  Dr. Conchita ParisNundkumar   CHIEF COMPLAINT:  VDRF   INITIAL PRESENTATION: 81 y/o M with a PMH of R sided hand / arm pain admitted on 3/25 for planned ACDF.  Early am 3/26, the patient developed agitation, hypoxia and respiratory distress.  He was emergently intubated per anesthesia (easy intubation but swollen).  PCCM consulted for ICU vent management.   STUDIES:  3/26  CT Neck >> post op prevertebral / retropharyngeal fluid & R neck hematoma, s/p C3-C7 ACDF, ETT in good position, lung apices with acute pulmonary edema  SIGNIFICANT EVENTS: 3/25  Admit for planned ACDF in setting of R sided hand / arm pain  3/26  Respiratory decompensation on floor, intubated per anesthesia     SUBJECTIVE: No acute events.  Pt able to wean on PSV.    VITAL SIGNS: Temp:  [97.9 F (36.6 C)-98.8 F (37.1 C)] 98.2 F (36.8 C) (03/27 0343) Pulse Rate:  [66-94] 72 (03/27 0900) Resp:  [10-19] 10 (03/27 0933) BP: (81-124)/(43-84) 92/45 mmHg (03/27 0933) SpO2:  [92 %-100 %] 97 % (03/27 0900) FiO2 (%):  [40 %-50 %] 40 % (03/27 0934)   VENTILATOR SETTINGS: Vent Mode:  [-] CPAP;PSV FiO2 (%):  [40 %-50 %] 40 % Set Rate:  [14 bmp] 14 bmp Vt Set:  [500 mL] 500 mL PEEP:  [5 cmH20] 5 cmH20 Pressure Support:  [5 cmH20] 5 cmH20 Plateau Pressure:  [11 cmH20-16 cmH20] 16 cmH20   INTAKE / OUTPUT:  Intake/Output Summary (Last 24 hours) at 09/25/14 1153 Last data filed at 09/25/14 1034  Gross per 24 hour  Intake 1244.89 ml  Output   1820 ml  Net -575.11 ml   PHYSICAL EXAMINATION: General:  wdwn adult male in NAD Neuro:  Sedate, no distress, C-Collar in place  HEENT:  OETT, mm pink/moist, anterior bruising, decreased swelling  Cardiovascular:  s1s2 rrr, no m/r/g Lungs:  resp's even/non-labored on vent, lungs bilaterally clear  Abdomen:  Flat, soft,  bsx4 active  Musculoskeletal:  No acute deformities  Skin:  Warm/dry, no edema   LABS:  CBC  Recent Labs Lab 09/23/14 0706  WBC 7.7  HGB 12.7*  HCT 36.9*  PLT 242   BMET  Recent Labs Lab 09/23/14 0706  NA 135  K 4.3  CL 102  CO2 23  BUN 15  CREATININE 0.95  GLUCOSE 92   Electrolytes  Recent Labs Lab 09/23/14 0706  CALCIUM 9.4   ABG  Recent Labs Lab 09/24/14 0630 09/24/14 0945  PHART 7.220* 7.479*  PCO2ART CRITICAL RESULT CALLED TO, READ BACK BY AND VERIFIED WITH: 32.2*  PO2ART 182.0* 197.0*   Glucose  Recent Labs Lab 09/24/14 0622 09/24/14 0809 09/24/14 1230 09/24/14 1640 09/24/14 2343 09/25/14 0600  GLUCAP 151* 154* 115* 131* 143* 136*   Imaging Ct Soft Tissue Neck Wo Contrast  09/24/2014   CLINICAL DATA:  81 year old male with respiratory distress and neck swelling status post cervical spine fusion yesterday. Intubated. Noncontrast study requested. Initial encounter.  EXAM: CT NECK WITHOUT CONTRAST  TECHNIQUE: Multidetector CT imaging of the neck was performed following the standard protocol without intravenous contrast.  COMPARISON:  Portable chest radiograph 0806 hours.  FINDINGS: Endotracheal tube in place, terminates at the level of the brachiocephalic artery. Mild synechiae in the trachea is distal to the tube. Visualized lungs remarkable for  prominent pulmonary vasculature and bilateral septal thickening. There is also dependent pulmonary opacity and trace or small volume pleural fluid suggested.  In the visualized superior mediastinum there is mild lymph node prominence. Increased right peritracheal density on the it lowest image series 201, image 118 might be related to the azygos vein, uncertain.  Sequelae of cervical ACDF from the C3 to the C7 level.  Prevertebral/retropharyngeal Fluid with trace gas is maximal at the C2-C3 level measuring up to 15 mm in thickness. This extends from the level of the nasopharynx on the left caudally to the level  of the superior thyroid gland in the midline. Similar indistinct fluid/hematoma along the right side operative approach above the thyroid. Mild posterior displacement of the right carotid sheath on that side. Intermediate density lateral to the right thyroid cartilage is compatible with hematoma encompassing 39 x 23 x 57 mm (AP by transverse by CC). Superimposed scattered trans spatial postoperative gas including in the submandibular spaces.  Endotracheal tube in place with fluid in the pharynx above the balloon surrounding the tube.  Calcified atherosclerosis at the skull base. Calcified carotid bifurcation atherosclerosis greater on the left. No cervical lymphadenopathy. Negative non contrast parotid and submandibular glands. Grossly negative visible non contrast brain parenchyma. Visualized paranasal sinuses and mastoids are clear.  Prominent right maxillary residual molar periapical lucency. Mandible intact.  Degenerative changes in the cervical spine superimposed on the postoperative findings. Osteopenia. There may be a nondisplaced fracture through the inferior anterior endplate of C5 as seen on coronal image 59. There is hardware streak artifact interfering with bone detail. Hardware appears intact.  IMPRESSION: 1. Postoperative prevertebral/retropharyngeal fluid and right neck hematoma (anterior to the right carotid space, lateral to the right thyroid cartilage) status post C3-C7 level ACDF. 2. Endotracheal tube appears in good position. Abnormal lung apices suggestive of acute pulmonary edema with dependent atelectasis and small volume pleural effusions. 3. Questionable nondisplaced anterior inferior endplate fracture at C5. Hardware intact.  A preliminary report of this exam was discussed in person with Dr. Lisbeth Renshaw by Dr. Warner Mccreedy at [phone] hours on 09/24/2014.   Electronically Signed   By: Odessa Fleming M.D.   On: 09/24/2014 09:11   Dg Chest Port 1 View  09/24/2014   CLINICAL DATA:  Endotracheal  tube placement.  Initial encounter.  EXAM: PORTABLE CHEST - 1 VIEW  COMPARISON:  Chest radiograph performed 04/07/2014  FINDINGS: The patient's endotracheal tube is seen ending 6 cm above the carina.  Lung expansion is decreased. Bibasilar airspace opacities and increased interstitial markings on the right may reflect pulmonary edema or postoperative atelectasis. Would assess for any signs of pneumonia. No pleural effusion or pneumothorax is seen.  The cardiomediastinal silhouette is normal in size. No acute osseous abnormalities are identified. An external pacing pad is noted. Cervical spinal fusion hardware is noted.  IMPRESSION: 1. Endotracheal tube seen ending 6 cm above the carina. 2. Lung expansion is decreased. Bibasilar airspace opacities and increased interstitial markings on the right may reflect pulmonary edema or postoperative atelectasis. Would assess clinically for any signs of pneumonia.   Electronically Signed   By: Roanna Raider M.D.   On: 09/24/2014 08:23   ASSESSMENT / PLAN:  NEUROLOGIC A:   Cervical Spondylosis with Myelopathy s/p Discectomy at C3-C7 P:   RASS goal: -2 Propofol for sedation  Fentanyl gtt for pain  Rec's per NSGY C-Collar   PULMONARY OETT 3/26 >>  A: Acute Respiratory Failure - in setting of post-op discectomy  due to R neck hematoma  R Neck Hematoma  Pulmonary Edema  P:   PRVC, wean PEEP/FiO2 for sats > 92% Decadron per NSGY Wait 48 hours to allow for reduction of swelling - re-assess am 3/28 with cuff leak prior to consideration for extubation  CARDIOVASCULAR CVL None A:  HTN HLD P:  Hold home gemfibrozil, losartan Monitor hemodynamics  RENAL A:   No acute issues  P:   Monitor BMP Replace electrolytes as indicated  D5NS with 20 kcl @ 40 ml/hr until TF started   GASTROINTESTINAL A:   Vent Associated Dysphagia  P:   NPO  Hold OGT insertion given neck hematoma PPI  HEMATOLOGIC A:   R Neck Hematoma   P:  Monitor CBC SCD's for  DVT   INFECTIOUS A:   Post-op ACDF - no evidence of acute infection P:   Cefazolin Q8x2 doses, per NSGY   ENDOCRINE A:   At Risk Hypoglycemia    P:   Dextrose to IVF while NPO Once TF initiated or pt extubated, d/c dextrose CBG Q6   FAMILY  - Updates: Family updated at bedside (wife and step-daughter) 3/27   Canary Brim, NP-C [facility] Pgr: [phone] or [phone]  Bedside the patient weaned very well.   Will keep sedated however for today, tomorrow will be 48 hours.  Will order SBT for AM upon wake up assessment.  No extubation however unless there is a cuff leak.  In the meantime, continue care as above.  The patient is critically ill with multiple organ systems failure and requires high complexity decision making for assessment and support, frequent evaluation and titration of therapies, application of advanced monitoring technologies and extensive interpretation of multiple databases.   Critical Care Time devoted to patient care services described in this note is  35  Minutes. This time reflects time of care of this signee Dr Koren Bound. This critical care time does not reflect procedure time, or teaching time or supervisory time of PA/NP/Med student/Med Resident etc but could involve care discussion time.  Alyson Reedy, M.D. [facility] Pulmonary/Critical Care Medicine. Pager: [phone]. After hours pager: [phone].

## 2014-09-25 NOTE — Progress Notes (Signed)
Called by primary RN to let me know they were calling a Code Blue on this pt. As I arrived on the floor the code blue pager alarmed.  The pt had been bagged for a short period due to resp arrest without loss of pulses.  Pt was placed in the upright position with a NRB and it became clear he was having airway occlusion issues.  Anesthesia was asked to intubate.  Pt became bradycardic & hypoxic prior to intubation and atropine was given & epi 2cc per Dr. Michelle Piperssey.  Pt was successfully intubated & then tx to MICU.  See Code Blue logs for details.

## 2014-09-25 NOTE — Progress Notes (Signed)
PT Cancellation/Discharge Note  Patient Details Name: Ricardo AsalDwight L Mountz MRN: [id number] DOB: 01/07/1933   Cancelled Treatment:    Reason Eval/Treat Not Completed: Medical issues which prohibited therapy. Patient admitted 3/25 for planned ACDF due to RUE pain.  On 3/26, patient developed respiratory failure requiring emergency intubation.  Patient sedated on vent.  Will discontinue PT at this time. MD:  Please reorder PT when appropriate for patient.  Thank you.   Vena AustriaDavis, Mithcell Schumpert H 09/25/2014, 1:17 PM Durenda HurtSusan H. Renaldo Fiddleravis, PT, [facility] Acute Rehab Services Pager [phone]

## 2014-09-25 NOTE — Progress Notes (Signed)
Holding PO meds, MD aware. No NG/OG tube placed due to swelling/hematoma in neck post-op.

## 2014-09-25 NOTE — Progress Notes (Signed)
CBG 104 

## 2014-09-25 NOTE — Progress Notes (Signed)
Utilization review completed.  

## 2014-09-26 ENCOUNTER — Inpatient Hospital Stay (HOSPITAL_COMMUNITY): Payer: Medicare Other

## 2014-09-26 ENCOUNTER — Encounter (HOSPITAL_COMMUNITY): Payer: Self-pay | Admitting: Neurosurgery

## 2014-09-26 DIAGNOSIS — G9340 Encephalopathy, unspecified: Secondary | ICD-10-CM

## 2014-09-26 DIAGNOSIS — J9601 Acute respiratory failure with hypoxia: Secondary | ICD-10-CM

## 2014-09-26 LAB — BLOOD GAS, ARTERIAL
ACID-BASE DEFICIT: 2.7 mmol/L — AB (ref 0.0–2.0)
Acid-base deficit: 0.8 mmol/L (ref 0.0–2.0)
BICARBONATE: 24 meq/L (ref 20.0–24.0)
Bicarbonate: 23.1 mEq/L (ref 20.0–24.0)
Drawn by: 398991
FIO2: 100 %
O2 Content: 2 L/min
O2 SAT: 91.2 %
O2 Saturation: 98.5 %
PATIENT TEMPERATURE: 98.6
PATIENT TEMPERATURE: 97.5
PH ART: 7.22 — AB (ref 7.350–7.450)
PO2 ART: 182 mmHg — AB (ref 80.0–100.0)
TCO2: 25.8 mmol/L (ref 0–100)
TCO2: 24.3 mmol/L (ref 0–100)
pCO2 arterial: 60.7 mmHg (ref 35.0–45.0)
pCO2 arterial: 35.9 mmHg (ref 35.0–45.0)
pH, Arterial: 7.422 (ref 7.350–7.450)
pO2, Arterial: 57 mmHg — ABNORMAL LOW (ref 80.0–100.0)

## 2014-09-26 LAB — BASIC METABOLIC PANEL
Anion gap: 9 (ref 5–15)
BUN: 18 mg/dL (ref 6–23)
CHLORIDE: 108 mmol/L (ref 96–112)
CO2: 24 mmol/L (ref 19–32)
Calcium: 8.7 mg/dL (ref 8.4–10.5)
Creatinine, Ser: 0.71 mg/dL (ref 0.50–1.35)
GFR calc non Af Amer: 87 mL/min — ABNORMAL LOW (ref >90)
Glucose, Bld: 115 mg/dL — ABNORMAL HIGH (ref 70–99)
POTASSIUM: 3.7 mmol/L (ref 3.5–5.1)
SODIUM: 141 mmol/L (ref 135–145)

## 2014-09-26 LAB — CBC
HCT: 32.2 % — ABNORMAL LOW (ref 39.0–52.0)
HEMOGLOBIN: 10.9 g/dL — AB (ref 13.0–17.0)
MCH: 29.8 pg (ref 26.0–34.0)
MCHC: 33.9 g/dL (ref 30.0–36.0)
MCV: 88 fL (ref 78.0–100.0)
Platelets: 242 10*3/uL (ref 150–400)
RBC: 3.66 MIL/uL — ABNORMAL LOW (ref 4.22–5.81)
RDW: 13.3 % (ref 11.5–15.5)
WBC: 10.5 10*3/uL (ref 4.0–10.5)

## 2014-09-26 LAB — GLUCOSE, CAPILLARY
Glucose-Capillary: 111 mg/dL — ABNORMAL HIGH (ref 70–99)
Glucose-Capillary: 134 mg/dL — ABNORMAL HIGH (ref 70–99)
Glucose-Capillary: 141 mg/dL — ABNORMAL HIGH (ref 70–99)
Glucose-Capillary: 158 mg/dL — ABNORMAL HIGH (ref 70–99)

## 2014-09-26 MED ORDER — FENTANYL CITRATE 0.05 MG/ML IJ SOLN
25.0000 ug | INTRAMUSCULAR | Status: DC | PRN
  Administered 2014-09-26 – 2014-09-27 (×7): 50 ug via INTRAVENOUS
  Administered 2014-09-29: 25 ug via INTRAVENOUS
  Filled 2014-09-26 (×8): qty 2

## 2014-09-26 MED ORDER — CETYLPYRIDINIUM CHLORIDE 0.05 % MT LIQD
7.0000 mL | Freq: Two times a day (BID) | OROMUCOSAL | Status: DC
  Administered 2014-09-27 (×2): 7 mL via OROMUCOSAL

## 2014-09-26 MED ORDER — HALOPERIDOL LACTATE 5 MG/ML IJ SOLN
1.0000 mg | INTRAMUSCULAR | Status: DC | PRN
  Administered 2014-09-26: 4 mg via INTRAVENOUS
  Filled 2014-09-26: qty 1

## 2014-09-26 NOTE — Progress Notes (Signed)
PULMONARY / CRITICAL CARE MEDICINE   Name: Ricardo Rivera MRN: [id number] DOB: [birth year]    ADMISSION DATE:  09/23/2014 CONSULTATION DATE:  09/24/14  REFERRING MD :  Dr. Conchita Paris   CHIEF COMPLAINT:  VDRF   INITIAL PRESENTATION: 81 y/o M with a PMH of R sided hand / arm pain admitted on 3/25 for planned ACDF.  Early am 3/26, the patient developed agitation, hypoxia and respiratory distress.  He was emergently intubated per anesthesia (easy intubation but swollen).  PCCM consulted for ICU vent management.   STUDIES:  3/26  CT Neck >> post op prevertebral / retropharyngeal fluid & R neck hematoma, s/p C3-C7 ACDF, ETT in good position, lung apices with acute pulmonary edema  SIGNIFICANT EVENTS: 3/25  Admit for planned ACDF in setting of R sided hand / arm pain  3/26  Respiratory decompensation on floor, intubated per anesthesia     SUBJECTIVE: Agitation on WUA this am   Pt able to wean on PSV.   afebrile  VITAL SIGNS: Temp:  [97.5 F (36.4 C)-98.7 F (37.1 C)] 97.5 F (36.4 C) (03/28 0740) Pulse Rate:  [57-96] 94 (03/28 1000) Resp:  [7-21] 21 (03/28 1000) BP: (92-145)/(45-96) 145/77 mmHg (03/28 1000) SpO2:  [97 %-100 %] 99 % (03/28 1000) FiO2 (%):  [40 %] 40 % (03/28 0800)   VENTILATOR SETTINGS: Vent Mode:  [-] PSV;CPAP FiO2 (%):  [40 %] 40 % Set Rate:  [14 bmp] 14 bmp Vt Set:  [500 mL] 500 mL PEEP:  [5 cmH20] 5 cmH20 Pressure Support:  [5 cmH20] 5 cmH20 Plateau Pressure:  [13 cmH20-23 cmH20] 23 cmH20   INTAKE / OUTPUT:  Intake/Output Summary (Last 24 hours) at 09/26/14 1003 Last data filed at 09/26/14 0800  Gross per 24 hour  Intake 1565.39 ml  Output   1175 ml  Net 390.39 ml   PHYSICAL EXAMINATION: General:  wdwn adult male in NAD Neuro:  Agitated, RASS+2 C-Collar in place , does not follow commands HEENT:  OETT, mm pink/moist, anterior bruising Cardiovascular:  s1s2 rrr, no m/r/g Lungs:  resp's even/non-labored on vent, lungs bilaterally clear  Abdomen:   Flat, soft, bsx4 active  Musculoskeletal:  No acute deformities  Skin:  Warm/dry, no edema   LABS:  CBC  Recent Labs Lab 09/23/14 0706  WBC 7.7  HGB 12.7*  HCT 36.9*  PLT 242   BMET  Recent Labs Lab 09/23/14 0706  NA 135  K 4.3  CL 102  CO2 23  BUN 15  CREATININE 0.95  GLUCOSE 92   Electrolytes  Recent Labs Lab 09/23/14 0706  CALCIUM 9.4   ABG  Recent Labs Lab 09/24/14 0630 09/24/14 0945  PHART 7.220* 7.479*  PCO2ART 60.7* 32.2*  PO2ART 182.0* 197.0*   Glucose  Recent Labs Lab 09/24/14 1640 09/24/14 2343 09/25/14 0600 09/25/14 1244 09/25/14 1716 09/25/14 2338  GLUCAP 131* 143* 136* 104* 139* 158*   Imaging No results found. ASSESSMENT / PLAN:  NEUROLOGIC A:   Cervical Spondylosis with Myelopathy s/p Discectomy at C3-C7 P:   RASS goal: -2 Propofol for sedation  Fentanyl gtt for pain  Rec's per NSGY C-Collar   PULMONARY OETT 3/26 >>  A: Acute Respiratory Failure - in setting of post-op discectomy due to R neck hematoma  R Neck Hematoma  Acute Pulmonary Edema  P:   PRVC, wean PEEP/FiO2 for sats > 92% Decadron per NSGY Has been intubated 48 hours- re-assess cuff leak prior to consider extubation  CARDIOVASCULAR CVL None A:  HTN HLD P:  Hold home gemfibrozil, losartan Monitor hemodynamics  RENAL A:   No acute issues  P:   Monitor BMP Replace electrolytes as indicated  D5NS with 20 kcl @ 40 ml/hr   GASTROINTESTINAL A:   Vent Associated Dysphagia  P:   NPO  Tfs if not extubated today PPI  HEMATOLOGIC A:   R Neck Hematoma   P:  Monitor CBC SCD's for DVT   INFECTIOUS A:   Post-op ACDF - no evidence of acute infection P:   Cefazolin Q8x2 doses, per NSGY   ENDOCRINE A:   At Risk Hypoglycemia    P:   Once TF initiated or pt extubated, d/c dextrose CBG Q6   FAMILY  - Updates: Family updated at bedside (wife and step-daughter) 3/27    The patient is critically ill with multiple organ systems failure  and requires high complexity decision making for assessment and support, frequent evaluation and titration of therapies, application of advanced monitoring technologies and extensive interpretation of multiple databases. Critical Care Time devoted to patient care services described in this note independent of APP time is 35 minutes.    Cyril Mourningakesh Kowen Kluth MD. Tonny BollmanFCCP. [place] [facility] Pager [phone] If no response call [phone]

## 2014-09-26 NOTE — Procedures (Signed)
Extubation Procedure Note  Patient Details:   Name: Arnell AsalDwight L Nishiyama DOB: 11/12/1932 MRN: [id number]   Airway Documentation:     Evaluation  O2 sats: stable throughout Complications: No apparent complications Patient did tolerate procedure well. Bilateral Breath Sounds: Clear Suctioning: Airway No   Patient extubated to 2L nasal cannula per MD order.  Positive cuff leak noted.  No evidence of stridor, at this time.  Sats currently 100%.  Vitals are stable.  Patient did not attempt to speak post extubation.  Per MD will obtain ABG in one hour.  Will continue to monitor.   Durwin GlazeBrown, Nolen Lindamood N 09/26/2014, 10:23 AM

## 2014-09-26 NOTE — Progress Notes (Signed)
125 mL of fentanyl wasted in the sink. Witnessed by American FinancialKatrice RN

## 2014-09-26 NOTE — Care Management Note (Signed)
    Page 1 of 2   10/05/2014     11:48:45 AM CARE MANAGEMENT NOTE 10/05/2014  Patient:  Ricardo Rivera,Ricardo Rivera   Account Number:  [id number]  Date Initiated:  09/26/2014  Documentation initiated by:  [facility],Junette Bernat  Subjective/Objective Assessment:   admitted for planned ACDF.  Early am 3/26, the patient developed agitation, hypoxia and respiratory distress.  He was emergently intubated per anesthesia     Action/Plan:   Anticipated DC Date:  10/06/2014   Anticipated DC Plan:  LONG TERM ACUTE CARE (LTAC)  In-house referral  Clinical Social Worker      DC Planning Services  CM consult      Choice offered to / List presented to:             Status of service:  Completed, signed off Medicare Important Message given?  YES (If response is "NO", the following Medicare IM given date fields will be blank) Date Medicare IM given:  09/26/2014 Medicare IM given by:  [facility],Ezzie Senat Date Additional Medicare IM given:  10/04/2014 Additional Medicare IM given by:  [facility]  Discharge Disposition:  LONG TERM ACUTE CARE (LTAC)  Per UR Regulation:  Reviewed for med. necessity/level of care/duration of stay  If discussed at Long Length of Stay Meetings, dates discussed:    Comments:  Contact:  Lenard Forthelson,Mary Lou Spouse [phone]                 French Anaracy ,  Step daughter Other [phone]     Chrissie NoaJill, Stepdaughter Other [phone]  10-05-14 11:40am Avie ArenasSarah Tyeshia Cornforth, RNBSN - [phone] Talked with PCCM, Dr. Sung AmabileSimonds and Neurosurgeon, Dr, Conchita ParisNundkumar about progression plan for Ltach.  Both physicians in agreement that patient is ready for Ltach transfer today.   Spoke with both daughters and wife.  Wife is ok with this but wants daughters to make this decision. Both daughters in agreement for tx to [facility] today.  [facility] notified.  Plan for tx to [facility] today.  4/5 1144 debbie dowell rn,bsn spoke w dr simonds feels will ultimately need ltac but not ready today. 10-02-14 7:05am Avie ArenasSarah Shiro Ellerman, RNBSN [phone] Trached yesterday.  Plan for Ltach when ready.  CM will continue to follow.  09-30-14 1:50pm Avie ArenasSarah Denessa Cavan, RNBSN - [phone] Talked with Chrissie NoaJill, Stepdaughter at home number.  [place] but is here on w/e's.  Her Mom who is hard of hearing and her step father prior to surgery lived together.  Jill's sister Kennith Centerracey lives close and is usually more accessible.  Updated Noreene LarssonJill, that he will need rehab prior to going home and he is eligible for the highest level - Ltach.  Feel this would be a good move. Informed we had two here in [place] and it would depend when he was ready and who had a bed at that time.  She will be updating her mother.  CM will continue to follow.      09-29-14 12noon Avie Arenas- Kharon Hixon, RNBSN [phone] Tx to SDU yesterday, but now has returned to ICU, intubated.  09-28-14 11am Avie ArenasSarah Debera Sterba, RNBSN [phone] Talked to surgeon about possible Ltach candidate, due to secretions requring suctioning, still confused, not able to work with therapy, failed speech - requiring tube for feeding.  Surgeon at this time just wanted to wait to see what he does and reevaluate next week.

## 2014-09-26 NOTE — Progress Notes (Signed)
I observed ETT to be at 24 cm at the lip. The nurse and I advanced the tube to 25 at the lip (which had previously been documented). After advancing the tube, it was readjusted at 24.5 for pts comfort. CXR ( 01:38 am) verified that the tube is at 6 cm above the carina.

## 2014-09-26 NOTE — Progress Notes (Signed)
Pt seen and examined. No issues overnight. Pt extubated this am. Currently not speaking well.  EXAM: Temp:  [97.5 F (36.4 C)-98.7 F (37.1 C)] 98.7 F (37.1 C) (03/28 1100) Pulse Rate:  [57-94] 94 (03/28 1000) Resp:  [7-21] 21 (03/28 1000) BP: (93-145)/(47-96) 145/77 mmHg (03/28 1000) SpO2:  [97 %-100 %] 99 % (03/28 1000) FiO2 (%):  [40 %] 40 % (03/28 0800) Intake/Output      03/27 0701 - 03/28 0700 03/28 0701 - 03/29 0700   I.V. (mL/kg) 1627.5 (18.8) 298 (3.4)   Total Intake(mL/kg) 1627.5 (18.8) 298 (3.4)   Urine (mL/kg/hr) 1225 (0.6) 275 (0.6)   Total Output 1225 275   Net +402.5 +23         Awake, alert, appears relatively calm Hoarse voice, able to speak slowly Moves all extremities well Neck diffusely firm  LABS: Lab Results  Component Value Date   CREATININE 0.71 09/26/2014   BUN 18 09/26/2014   NA 141 09/26/2014   K 3.7 09/26/2014   CL 108 09/26/2014   CO2 24 09/26/2014   Lab Results  Component Value Date   WBC 10.5 09/26/2014   HGB 10.9* 09/26/2014   HCT 32.2* 09/26/2014   MCV 88.0 09/26/2014   PLT 242 09/26/2014    IMPRESSION: - 81 y.o. male s/p re-intubation after ACDF likely due to diffuse swelling, now extubated. Appears stable for now  PLAN: - Cont to monitor in ICU - Will get SLP tomorrow - Cont steroids

## 2014-09-27 ENCOUNTER — Inpatient Hospital Stay (HOSPITAL_COMMUNITY): Payer: Medicare Other

## 2014-09-27 DIAGNOSIS — G8250 Quadriplegia, unspecified: Secondary | ICD-10-CM

## 2014-09-27 LAB — GLUCOSE, CAPILLARY
GLUCOSE-CAPILLARY: 104 mg/dL — AB (ref 70–99)
GLUCOSE-CAPILLARY: 104 mg/dL — AB (ref 70–99)
GLUCOSE-CAPILLARY: 152 mg/dL — AB (ref 70–99)
Glucose-Capillary: 129 mg/dL — ABNORMAL HIGH (ref 70–99)
Glucose-Capillary: 130 mg/dL — ABNORMAL HIGH (ref 70–99)

## 2014-09-27 LAB — CBC
HCT: 36.5 % — ABNORMAL LOW (ref 39.0–52.0)
Hemoglobin: 12.3 g/dL — ABNORMAL LOW (ref 13.0–17.0)
MCH: 29.5 pg (ref 26.0–34.0)
MCHC: 33.7 g/dL (ref 30.0–36.0)
MCV: 87.5 fL (ref 78.0–100.0)
PLATELETS: 329 10*3/uL (ref 150–400)
RBC: 4.17 MIL/uL — ABNORMAL LOW (ref 4.22–5.81)
RDW: 13 % (ref 11.5–15.5)
WBC: 12.3 10*3/uL — ABNORMAL HIGH (ref 4.0–10.5)

## 2014-09-27 LAB — BASIC METABOLIC PANEL
Anion gap: 10 (ref 5–15)
BUN: 15 mg/dL (ref 6–23)
CHLORIDE: 102 mmol/L (ref 96–112)
CO2: 27 mmol/L (ref 19–32)
Calcium: 8.9 mg/dL (ref 8.4–10.5)
Creatinine, Ser: 0.65 mg/dL (ref 0.50–1.35)
GFR calc Af Amer: >90 mL/min (ref >90)
GFR calc non Af Amer: >90 mL/min (ref >90)
GLUCOSE: 127 mg/dL — AB (ref 70–99)
POTASSIUM: 3.6 mmol/L (ref 3.5–5.1)
Sodium: 139 mmol/L (ref 135–145)

## 2014-09-27 MED ORDER — VITAL HIGH PROTEIN PO LIQD
1000.0000 mL | ORAL | Status: DC
  Filled 2014-09-27 (×2): qty 1000

## 2014-09-27 MED ORDER — VITAL AF 1.2 CAL PO LIQD
1000.0000 mL | ORAL | Status: DC
  Administered 2014-09-27 – 2014-10-04 (×6): 1000 mL
  Filled 2014-09-27 (×16): qty 1000

## 2014-09-27 MED ORDER — CETYLPYRIDINIUM CHLORIDE 0.05 % MT LIQD
7.0000 mL | Freq: Two times a day (BID) | OROMUCOSAL | Status: DC
  Administered 2014-09-27 – 2014-09-29 (×6): 7 mL via OROMUCOSAL

## 2014-09-27 NOTE — Evaluation (Signed)
Clinical/Bedside Swallow Evaluation Patient Details  Name: Ricardo Rivera MRN: [id number] Date of Birth: Jan 04, 1933  Today's Date: 09/27/2014 Time: SLP Start Time (ACUTE ONLY): 0940 SLP Stop Time (ACUTE ONLY): 0950 SLP Time Calculation (min) (ACUTE ONLY): 10 min  Past Medical History:  Past Medical History  Diagnosis Date  . Hypertension   . Shortness of breath dyspnea   . Arthritis   . Hyperlipemia   . Frequent urination   . Nocturia    Past Surgical History:  Past Surgical History  Procedure Laterality Date  . Spinal surgery      fusion  . Spinal fusion  1986  . Shoulder surgies Bilateral   . Lower back  1970  . Cholecystectomy  1998  . Anterior cervical decompression/discectomy fusion 4 levels N/A 09/23/2014    Procedure: CERVICAL THREE-FOUR,CERVICAL FOUR-FIVE,CERVICAL FIVE-SIX,CERVICAL SIX-SEVEN,ANTERIOR CERVICAL DECOMPRESSION WITH FUSION INTERBODY PROSTHESIS PLATING AND BONEGRAFT.;  Surgeon: Lisbeth Renshaw, MD;  Location: [facility];  Service: Neurosurgery;  Laterality: N/A;   HPI:  Pt is a 81 y/o male with PMH of HTD, HLD, arthritis, prior spinal fusion (1986) wuth primary complaint of R sided hand/arm pain. Pt underwent panned cervical spine fusion of C3-C7 (09/24/14). Pt developed respiratory distress and was intubated 3/26; pt extubated 3/28. Pt has no prior hx. of dysphagia. CXR (3/28) revealed suspect small R pleural effusion and mild bibasilar atelectasis.   Assessment / Plan / Recommendation Clinical Impression   Pt was seen for swallow evaluation. Pt very lethargic with poor participation; pt doesn't follow commands. PO trials with ice chips and teaspoon amounts of thin liquids elicited delayed cough x3, followed by throat clear and expectoration of fluid and mucous. Suspect thin liquids are unable to pass upper UES due to edema following ACDF.  Given pt''s current mental status, history of recent intubation and ACDF, recommend MBS to objectively evaluate the  swallow, though pt is not ready to participate in this test given poor mentation and cooperation. Hopeful with improvement in mentation, pt will be able to participate during MBS. Speech will follow up with PO trials to further assess pt readiness for MBS.     Aspiration Risk  Moderate    Diet Recommendation NPO   Liquid Administration via: Cup Medication Administration: Crushed with puree    Other  Recommendations Recommended Consults: MBS Oral Care Recommendations: Oral care Q4 per protocol   Follow Up Recommendations       Frequency and Duration min 2x/week  2 weeks   Pertinent Vitals/Pain     SLP Swallow Goals     Swallow Study Prior Functional Status       General HPI: Pt is a 81 y/o male with PMH of HTD, HLD, arthritis, prior spinal fusion (1986) wuth primary complaint of R sided hand/arm pain. Pt underwent panned cervical spine fusion of C3-C7 (09/24/14). Pt developed respiratory distress and was intubated 3/26; pt extubated 3/28. Pt has no prior hx. of dysphagia. CXR (3/28) revealed suspect small R pleural effusion and mild bibasilar atelectasis. Type of Study: Bedside swallow evaluation Diet Prior to this Study: NPO Temperature Spikes Noted: No Respiratory Status: Nasal cannula History of Recent Intubation: Yes Length of Intubations (days):  (2) Date extubated: 09/26/14 Behavior/Cognition: Doesn't follow directions;Decreased sustained attention;Uncooperative Oral Cavity - Dentition: Adequate natural dentition;Missing dentition Self-Feeding Abilities: Total assist Patient Positioning: Upright in bed Baseline Vocal Quality: Hoarse;Low vocal intensity Volitional Cough: Cognitively unable to elicit Volitional Swallow: Unable to elicit    Oral/Motor/Sensory Function Overall Oral Motor/Sensory Function:  Other (comment) (unable to asses) Labial ROM: Other (Comment) (unable to asses) Labial Symmetry: Other (Comment) (unable to asses) Lingual ROM: Other (Comment)  (unable to asses) Lingual Symmetry: Other (Comment) (unable to asses) Facial ROM: Other (Comment) (unable to asses)   Ice Chips     Thin Liquid Thin Liquid: Impaired Presentation: Spoon Pharyngeal  Phase Impairments: Cough - Delayed    Nectar Thick Nectar Thick Liquid: Not tested   Honey Thick Honey Thick Liquid: Not tested   Puree Puree: Not tested   Solid   GO    Solid: Not tested       Ricardo Rivera, Ricardo Rivera 09/27/2014,10:42 AM

## 2014-09-27 NOTE — Progress Notes (Signed)
PULMONARY / CRITICAL CARE MEDICINE   Name: Ricardo Rivera MRN: [id number] DOB: [birth year]    ADMISSION DATE:  09/23/2014 CONSULTATION DATE:  09/24/14  REFERRING MD :  Dr. Conchita Paris   CHIEF COMPLAINT:  VDRF   INITIAL PRESENTATION: 81 y/o M with a PMH of R sided hand / arm pain admitted on 3/25 for planned ACDF.  Early am 3/26, the patient developed agitation, hypoxia and respiratory distress.  He was emergently intubated per anesthesia (easy intubation but swollen).  PCCM consulted for ICU vent management.   STUDIES:  3/26  CT Neck >> post op prevertebral / retropharyngeal fluid & R neck hematoma, s/p C3-C7 ACDF, ETT in good position, lung apices with acute pulmonary edema  SIGNIFICANT EVENTS: 3/25  Admit for planned ACDF in setting of R sided hand / arm pain  3/26  Respiratory decompensation on floor, intubated per anesthesia  3/29 failed swallow   SUBJECTIVE: Int Agitation  Not co-operative with nursing care afebrile  VITAL SIGNS: Temp:  [97.7 F (36.5 C)-99 F (37.2 C)] 98 F (36.7 C) (03/29 1100) Pulse Rate:  [95-124] 98 (03/29 1000) Resp:  [14-33] 33 (03/29 1000) BP: (140-175)/(62-80) 140/62 mmHg (03/29 1000) SpO2:  [93 %-100 %] 100 % (03/29 1000)   VENTILATOR SETTINGS:     INTAKE / OUTPUT:  Intake/Output Summary (Last 24 hours) at 09/27/14 1113 Last data filed at 09/27/14 1000  Gross per 24 hour  Intake   1150 ml  Output   4525 ml  Net  -3375 ml   PHYSICAL EXAMINATION: General:  wdwn adult male in NAD Neuro: RASS 0 C-Collar in place , does not follow commands, power 2/5 all 4Es, plantars Bl upgoing HEENT:  OETT, mm pink/moist, anterior bruising Cardiovascular:  s1s2 rrr, no m/r/g Lungs:  resp's even/non-labored , lungs bilaterally clear  Abdomen:  Flat, soft, bsx4 active  Musculoskeletal:  No acute deformities  Skin:  Warm/dry, no edema   LABS:  CBC  Recent Labs Lab 09/23/14 0706 09/26/14 1124 09/27/14 0240  WBC 7.7 10.5 12.3*  HGB 12.7* 10.9*  12.3*  HCT 36.9* 32.2* 36.5*  PLT 242 242 329   BMET  Recent Labs Lab 09/23/14 0706 09/26/14 1124 09/27/14 0240  NA 135 141 139  K 4.3 3.7 3.6  CL 102 108 102  CO2 BUN CREATININE 0.95 0.71 0.65  GLUCOSE 92 115* 127*   Electrolytes  Recent Labs Lab 09/23/14 0706 09/26/14 1124 09/27/14 0240  CALCIUM 9.4 8.7 8.9   ABG  Recent Labs Lab 09/24/14 0630 09/24/14 0945 09/26/14 1140  PHART 7.220* 7.479* 7.422  PCO2ART 60.7* 32.2* 35.9  PO2ART 182.0* 197.0* 57.0*   Glucose  Recent Labs Lab 09/25/14 2338 09/26/14 1131 09/26/14 1739 09/26/14 2024 09/27/14 0029 09/27/14 0614  GLUCAP 158* 111* 134* 141* 130* 104*   Imaging Dg Chest Port 1 View  09/26/2014   CLINICAL DATA:  Endotracheal tube repositioning. Subsequent encounter.  EXAM: PORTABLE CHEST - 1 VIEW  COMPARISON:  Chest radiograph performed 09/24/2014  FINDINGS: The patient's endotracheal tube is seen ending 6 cm above the carina.  A small right pleural effusion is suspected. Mild bibasilar atelectasis is noted. No pneumothorax is identified  The cardiomediastinal silhouette is borderline normal in size, with mild stable prominence of the superior mediastinum. No acute osseous abnormalities are seen. The patient is status post cervical spinal fusion.  IMPRESSION: 1. Endotracheal tube seen ending 6 cm above the carina. 2. Small right pleural  effusion suspected. Mild bibasilar atelectasis noted.   Electronically Signed   By: Roanna RaiderJeffery  Chang M.D.   On: 09/26/2014 01:38   ASSESSMENT / PLAN:  NEUROLOGIC A:   Cervical Spondylosis with Myelopathy s/p Discectomy at C3-C7 P:   Fentanyl prn for pain  Rec's per NSGY C-Collar   PULMONARY OETT 3/26 >> 3/28 A: Acute Respiratory Failure - in setting of post-op discectomy due to R neck hematoma  R Neck Hematoma  Acute Pulmonary Edema -resolved P:   Decadron per NSGY Pulm hygiene  CARDIOVASCULAR CVL None A:  HTN HLD P:  Hold home gemfibrozil,  losartan Monitor hemodynamics  RENAL A:   No acute issues  P:   Monitor BMP Replace electrolytes as indicated  D5NS with 20 kcl @ 40 ml/hr   GASTROINTESTINAL A:   Vent Associated Dysphagia  P:   Place NGT & start Tfs  PPI  HEMATOLOGIC A:   R Neck Hematoma   P:  Monitor CBC SCD's for DVT   INFECTIOUS A:   Post-op ACDF - no evidence of acute infection P:   Cefazolin peri-op per NSGY   ENDOCRINE A:   At Risk Hypoglycemia    P:   Once TF initiated , d/c dextrose CBG Q6   FAMILY  - Updates: Family updated at bedside (wife and step-daughter) 3/27    The patient is critically ill with multiple organ systems failure and requires high complexity decision making for assessment and support, frequent evaluation and titration of therapies, application of advanced monitoring technologies and extensive interpretation of multiple databases. Critical Care Time devoted to patient care services described in this note independent of APP time is 31 minutes.    Cyril Mourningakesh Alontae Chaloux MD. Tonny BollmanFCCP. [facility] Pager [phone] If no response call [phone]

## 2014-09-27 NOTE — Progress Notes (Signed)
OT Cancellation Note  Patient Details Name: Ricardo Rivera MRN: [id number] DOB: 06/01/1933   Cancelled Treatment:    Reason Eval/Treat Not Completed: Medical issues which prohibited therapy.  Events post op noted.  Pt currently without activity orders.  Will defer eval until cleared by MD and activity orders updated.  Thank you.   Angelene GiovanniConarpe, Minetta Krisher M  Jerett Odonohue Sudleyonarpe, OTR/L [phone]  09/27/2014, 1:17 PM

## 2014-09-27 NOTE — Progress Notes (Signed)
NUTRITION FOLLOW UP  Intervention:   Initiate TF via NGT with Vital AF 1.2 at 25 ml/h and Prostat 30 ml BID on day 1; on day 2, increase to goal rate of 65 ml/h (1560 ml per day) to provide 2072 kcals, 147 gm protein, 1265 ml free water daily.  Nutrition Dx:   Inadequate oral intake related to inability to eat as evidenced by NPO status; ongoing  Goal:   Pt will meet >90% of estimated nutritional needs; unmet  Monitor:   TF initiation/advancement/tolerance, labs, weight changes, I/O's  Assessment:   81 y/o M with a PMH of R sided hand / arm pain admitted on 3/25 for planned ACDF. Early am 3/26, the patient developed agitation, hypoxia and respiratory distress. He was emergently intubated per anesthesia (easy intubation but swollen). PCCM consulted for ICU vent management.   Pt extubated on 09/26/14. Nutrition needs re-estimated due to change in status.  Pt failed swallow evaluation due to poor mentation this AM. RD received consult to initiate TF. RN about to place NGT at time of visit. Awaiting Xray confirmation for tube placement.  Labs reviewed. Glucose: 127. CBGS: 104-141.   Height: Ht Readings from Last 1 Encounters:  09/23/14 5\' 11"  (1.803 m)    Weight Status:   Wt Readings from Last 1 Encounters:  09/23/14 190 lb 11.2 oz (86.5 kg)   Re-estimated needs:  Kcal: 1900-2100 Protein: 115-130 grams Fluid: 1.8-2.0 L  Skin: closed rt neck incision  Diet Order: Diet NPO time specified   Intake/Output Summary (Last 24 hours) at 09/27/14 1220 Last data filed at 09/27/14 1000  Gross per 24 hour  Intake   1100 ml  Output   4175 ml  Net  -3075 ml    Last BM: PTA   Labs:   Recent Labs Lab 09/23/14 0706 09/26/14 1124 09/27/14 0240  NA 135 141 139  K 4.3 3.7 3.6  CL 102 108 102  CO2 23 24 27   BUN 15 18 15   CREATININE 0.95 0.71 0.65  CALCIUM 9.4 8.7 8.9  GLUCOSE 92 115* 127*    CBG (last 3)   Recent Labs  09/26/14 2024 09/27/14 0029 09/27/14 0614   GLUCAP 141* 130* 104*    Scheduled Meds: . antiseptic oral rinse  7 mL Mouth Rinse BID  . brimonidine  1 drop Both Eyes TID  . cholecalciferol  2,000 Units Oral Daily  . dexamethasone  4 mg Intravenous QID  . docusate sodium  100 mg Oral BID  . feeding supplement (VITAL HIGH PROTEIN)  1,000 mL Per Tube Q24H  . heparin subcutaneous  5,000 Units Subcutaneous 3 times per day  . latanoprost  1 drop Both Eyes QHS  . multivitamin with minerals  1 tablet Oral Daily  . oxybutynin  5 mg Oral Daily  . pantoprazole (PROTONIX) IV  40 mg Intravenous Daily  . senna  1 tablet Oral BID    Continuous Infusions: . sodium chloride 10 mL/hr at 09/25/14 2000  . dextrose 5 % and 0.9 % NaCl with KCl 20 mEq/L 40 mL/hr at 09/26/14 1739    Lexie Morini A. Mayford KnifeWilliams, RD, LDN, CDE Pager: [phone] After hours Pager: [phone]

## 2014-09-27 NOTE — Progress Notes (Signed)
No issues overnight. Pt reports doing "ok."  EXAM:  BP 146/58 mmHg  Pulse 98  Temp(Src) 98 F (36.7 C) (Oral)  Resp 31  Ht 5\' 11"  (1.803 m)  Wt 86.5 kg (190 lb 11.2 oz)  BMI 26.61 kg/m2  SpO2 97%  Awake, alert Dysarthric, but able to speak intelligibly Moves all extremities symmetrically Neck soft  IMPRESSION:  81 y.o. male with generalize neck edema after ACDF, now extubated 24hrs and remains stable - Failed swallow  PLAN: - Cont to monitor in ICU, if stable likely transfer to floor tomorrow - Start TF - Cont decadron

## 2014-09-28 LAB — CBC
HEMATOCRIT: 38.6 % — AB (ref 39.0–52.0)
Hemoglobin: 13 g/dL (ref 13.0–17.0)
MCH: 29.5 pg (ref 26.0–34.0)
MCHC: 33.7 g/dL (ref 30.0–36.0)
MCV: 87.7 fL (ref 78.0–100.0)
Platelets: 335 10*3/uL (ref 150–400)
RBC: 4.4 MIL/uL (ref 4.22–5.81)
RDW: 13 % (ref 11.5–15.5)
WBC: 11.5 10*3/uL — ABNORMAL HIGH (ref 4.0–10.5)

## 2014-09-28 LAB — BASIC METABOLIC PANEL
Anion gap: 7 (ref 5–15)
BUN: 27 mg/dL — ABNORMAL HIGH (ref 6–23)
CO2: 25 mmol/L (ref 19–32)
CREATININE: 0.69 mg/dL (ref 0.50–1.35)
Calcium: 8.8 mg/dL (ref 8.4–10.5)
Chloride: 108 mmol/L (ref 96–112)
GFR calc Af Amer: >90 mL/min (ref >90)
GFR calc non Af Amer: 88 mL/min — ABNORMAL LOW (ref >90)
GLUCOSE: 142 mg/dL — AB (ref 70–99)
POTASSIUM: 3.4 mmol/L — AB (ref 3.5–5.1)
Sodium: 140 mmol/L (ref 135–145)

## 2014-09-28 LAB — GLUCOSE, CAPILLARY
GLUCOSE-CAPILLARY: 168 mg/dL — AB (ref 70–99)
Glucose-Capillary: 121 mg/dL — ABNORMAL HIGH (ref 70–99)
Glucose-Capillary: 156 mg/dL — ABNORMAL HIGH (ref 70–99)
Glucose-Capillary: 157 mg/dL — ABNORMAL HIGH (ref 70–99)

## 2014-09-28 LAB — CLOSTRIDIUM DIFFICILE BY PCR: Toxigenic C. Difficile by PCR: NEGATIVE

## 2014-09-28 MED ORDER — POTASSIUM CHLORIDE 20 MEQ/15ML (10%) PO SOLN
40.0000 meq | Freq: Once | ORAL | Status: AC
  Administered 2014-09-28: 40 meq
  Filled 2014-09-28: qty 30

## 2014-09-28 MED ORDER — DOCUSATE SODIUM 100 MG PO CAPS
100.0000 mg | ORAL_CAPSULE | Freq: Every day | ORAL | Status: DC | PRN
Start: 2014-09-28 — End: 2014-10-05
  Filled 2014-09-28: qty 1

## 2014-09-28 MED ORDER — SENNA 8.6 MG PO TABS
1.0000 | ORAL_TABLET | Freq: Every day | ORAL | Status: DC | PRN
  Filled 2014-09-28: qty 1

## 2014-09-28 MED ORDER — METOPROLOL TARTRATE 1 MG/ML IV SOLN: 2.5000 mg | INTRAVENOUS | Status: DC | PRN

## 2014-09-28 MED ORDER — PANTOPRAZOLE SODIUM 40 MG PO PACK
40.0000 mg | PACK | Freq: Every day | ORAL | Status: DC
  Administered 2014-09-30 – 2014-10-05 (×6): 40 mg
  Filled 2014-09-28 (×7): qty 20

## 2014-09-28 MED FILL — Medication: Qty: 1 | Status: AC

## 2014-09-28 NOTE — Progress Notes (Signed)
[facility] FOR AM LAB REPLACEMENT ONLY  The patient does apply [facility] the [facility] Electroly[facility] Protocol based on the criteria listed below:   1. Is GFR >/= 40 ml/min? Yes.    Patient's GFR today is 88 2. Is urine output >/= 0.5 ml/kg/hr for the last 6 hours? Yes.   Patient's UOP is .67 ml/kg/hr 3. Is BUN < 60 mg/dL? Yes.    Patient's BUN today is 27 4. Abnormal electrolyte  Per protocol 5. Ordered repletion with: K 3.4 6. If a panic level lab has been reported, has the CCM MD in charge been notified? Yes.  .   Physician:  Tonny BranchSommer  Natalya Domzalski, Lang Snowlizabeth McEachran 09/28/2014 4:42 AM

## 2014-09-28 NOTE — Significant Event (Signed)
1830pm-patient has been transferred to 3S03 via bed, VS stable prior and during the transfer. Patient's spouse and daughter at bedside. No personal belongings at bedside of 2MW that could be taken up to his new room. Report given to receiving dayshift RN. Yatzary Merriweather, Charity fundraiserN.

## 2014-09-28 NOTE — Progress Notes (Signed)
OT Cancellation Note  Patient Details Name: Arnell AsalDwight L Lonzo MRN: [id number] DOB: 05/21/1933   Cancelled Treatment:    Reason Eval/Treat Not Completed: Fatigue/lethargy limiting ability to participate (Pt had just returned to bed with nursing assist.) Will continue to follow.  Evern BioMayberry, Meighan Treto Lynn 09/28/2014, 2:59 PM

## 2014-09-28 NOTE — Progress Notes (Addendum)
Pt with increased oral secretions and rhonchi. Unable to cough up all secretions so NT suctioned pt. Dr. Levada SchillingSummers made aware.

## 2014-09-28 NOTE — Progress Notes (Addendum)
PULMONARY / CRITICAL CARE MEDICINE   Name: Ricardo Rivera MRN: [id number] DOB: Jan 06, 1933    ADMISSION DATE:  09/23/2014 CONSULTATION DATE:  09/24/14  REFERRING MD :  Dr. Conchita Paris   CHIEF COMPLAINT:  VDRF   INITIAL PRESENTATION: 81 y/o M with a PMH of R sided hand / arm pain admitted on 3/25 for planned ACDF.  Early am 3/26, the patient developed agitation, hypoxia and respiratory distress.  He was emergently intubated per anesthesia (easy intubation but swollen).  PCCM consulted for ICU vent management.   STUDIES:  3/26  CT Neck >> post op prevertebral / retropharyngeal fluid & R neck hematoma, s/p C3-C7 ACDF, ETT in good position, lung apices with acute pulmonary edema  SIGNIFICANT EVENTS: 3/25  Admit for planned ACDF in setting of R sided hand / arm pain  3/26  Respiratory decompensation on floor, intubated per anesthesia  3/29 failed swallow -NG inserted   SUBJECTIVE:  Much calmer   co-operative with nursing care febrile  VITAL SIGNS: Temp:  [97.7 F (36.5 C)-101.1 F (38.4 C)] 98.5 F (36.9 C) (03/30 1100) Pulse Rate:  [78-113] 96 (03/30 1331) Resp:  [21-40] 35 (03/30 1331) BP: (123-180)/(57-88) 148/74 mmHg (03/30 1331) SpO2:  [89 %-100 %] 96 % (03/30 1331) Weight:  [85.5 kg (188 lb 7.9 oz)] 85.5 kg (188 lb 7.9 oz) (03/30 0500)   VENTILATOR SETTINGS:     INTAKE / OUTPUT:  Intake/Output Summary (Last 24 hours) at 09/28/14 1413 Last data filed at 09/28/14 1300  Gross per 24 hour  Intake   1695 ml  Output   1275 ml  Net    420 ml   PHYSICAL EXAMINATION: General:  wdwn adult male in NAD Neuro: RASS 0 C-Collar in place ,  follows commands, power 2/5 all 4Es, plantars Bl upgoing HEENT:  OETT, mm pink/moist, anterior bruising Cardiovascular:  s1s2 rrr, no m/r/g Lungs:  resp's even/non-labored , lungs bilaterally clear  Abdomen:  Flat, soft, bsx4 active  Musculoskeletal:  No acute deformities  Skin:  Warm/dry, no edema   LABS:  CBC  Recent Labs Lab  09/26/14 1124 09/27/14 0240 09/28/14 0254  WBC 10.5 12.3* 11.5*  HGB 10.9* 12.3* 13.0  HCT 32.2* 36.5* 38.6*  PLT 242 329 335   BMET  Recent Labs Lab 09/26/14 1124 09/27/14 0240 09/28/14 0254  NA 141 139 140  K 3.7 3.6 3.4*  CL 108 102 108  CO2 BUN 18 15 27*  CREATININE 0.71 0.65 0.69  GLUCOSE 115* 127* 142*   Electrolytes  Recent Labs Lab 09/26/14 1124 09/27/14 0240 09/28/14 0254  CALCIUM 8.7 8.9 8.8   ABG  Recent Labs Lab 09/24/14 0630 09/24/14 0945 09/26/14 1140  PHART 7.220* 7.479* 7.422  PCO2ART 60.7* 32.2* 35.9  PO2ART 182.0* 197.0* 57.0*   Glucose  Recent Labs Lab 09/27/14 0614 09/27/14 1058 09/27/14 1806 09/27/14 2339 09/28/14 0344 09/28/14 0756  GLUCAP 104* 104* 129* 152* 121* 168*   Imaging Dg Abd Portable 1v  09/27/2014   CLINICAL DATA:  Nasogastric tube placement.  EXAM: PORTABLE ABDOMEN - 1 VIEW  COMPARISON:  None.  FINDINGS: The bowel gas pattern is normal. No radio-opaque calculi or other significant radiographic abnormality are seen.  Panda Tube lies along the greater curvature of the stomach near the fundus. It needs to be advanced several cm to lie distal to the pylorus.  IMPRESSION: Panda tube lies along the greater curvature the stomach.   Electronically Signed   By: Jonny Ruiz  Curnes M.D.   On: 09/27/2014 13:16   ASSESSMENT / PLAN:  NEUROLOGIC A:   Cervical Spondylosis with Myelopathy s/p Discectomy at C3-C7 P:   Fentanyl prn for pain  Rec's per NSGY C-Collar  PT consult  PULMONARY OETT 3/26 >> 3/28 A: Acute Respiratory Failure - in setting of post-op discectomy due to R neck hematoma  R Neck Hematoma  Acute Pulmonary Edema -resolved P:   Decadron per NSGY Pulm hygiene  CARDIOVASCULAR CVL None A:  HTN HLD P:  Hold home gemfibrozil, losartan Monitor hemodynamics  RENAL A:   No acute issues  P:   Monitor BMP Replace electrolytes as indicated  Dc IVFs  GASTROINTESTINAL A:   Vent Associated  Dysphagia  P:   ct Tfs -rechk swallow eval PPI  HEMATOLOGIC A:   R Neck Hematoma   P:  Monitor CBC SCD's for DVT   INFECTIOUS A:   Post-op ACDF - no evidence of acute infection P:   Cefazolin peri-op per NSGY   ENDOCRINE A:   At Risk Hypoglycemia    P:   Once TF initiated , d/c dextrose CBG Q6   FAMILY  - Updates: Family updated at bedside (wife and step-daughter) 3/27   OK to transfer to SDU  The patient is critically ill with multiple organ systems failure and requires high complexity decision making for assessment and support, frequent evaluation and titration of therapies, application of advanced monitoring technologies and extensive interpretation of multiple databases. Critical Care Time devoted to patient care services described in this note independent of APP time is 31 minutes.    Cyril Mourningakesh Suraiya Dickerson MD. Tonny BollmanFCCP. [facility] Pager [phone] If no response call [phone]

## 2014-09-28 NOTE — Progress Notes (Signed)
Speech Language Pathology Treatment: Dysphagia  Patient Details Name: Ricardo Rivera MRN: [id number] DOB: 06/07/1933 Today's Date: 09/28/2014 Time: 0810-0830 SLP Time Calculation (min) (ACUTE ONLY): 20 min  Assessment / Plan / Recommendation Clinical Impression  Pt more alert, able to verbalize words, short phrases, but not consistently following commands, significantly weak. Pt is not managing secretions; upon arrival vocal quality wet. Suspect edema in posterior pharyngeal wall still impeding bolus flow through upper esophageal sphincter. Pt is not able to expectorate with max cues. Provided oral care and ice chip x1 with no improvement. Pt is not ready to attempt objective test and would not tolerate POs based on function today. Recommend alternative means continue. Will f/u tomorrow for progress.    HPI HPI: Pt is a 81 y/o male with PMH of HTD, HLD, arthritis, prior spinal fusion (1986) wuth primary complaint of R sided hand/arm pain. Pt underwent panned cervical spine fusion of C3-C7 (09/24/14). Pt developed respiratory distress and was intubated 3/26; pt extubated 3/28. Pt has no prior hx. of dysphagia. CXR (3/28) revealed suspect small R pleural effusion and mild bibasilar atelectasis.   Pertinent Vitals    SLP Plan  Continue with current plan of care    Recommendations Diet recommendations: NPO Medication Administration: Via alternative means              Oral Care Recommendations: Oral care Q4 per protocol Plan: Continue with current plan of care    GO    [facility]urgcenter [facility] LLCBonnie Chynah Orihuela, MA CCC-SLP [phone]  Claudine MoutonDeBlois, Latifa Noble Caroline 09/28/2014, 9:26 AM

## 2014-09-28 NOTE — Progress Notes (Signed)
Pt seen and examined. No issues overnight. Extubated 48hrs ago, remains stable.  EXAM: Temp:  [97.7 F (36.5 C)-101.1 F (38.4 C)] 101.1 F (38.4 C) (03/30 0700) Pulse Rate:  [78-113] 112 (03/30 0900) Resp:  [21-40] 36 (03/30 0800) BP: (128-180)/(57-88) 150/79 mmHg (03/30 0900) SpO2:  [89 %-100 %] 92 % (03/30 0900) Weight:  [85.5 kg (188 lb 7.9 oz)] 85.5 kg (188 lb 7.9 oz) (03/30 0500) Intake/Output      03/29 0701 - 03/30 0700 03/30 0701 - 03/31 0700   I.V. (mL/kg) 920 (10.8) 80 (0.9)   Other 10    NG/GT 465 130   Total Intake(mL/kg) 1395 (16.3) 210 (2.5)   Urine (mL/kg/hr) 1510 (0.7) 120 (0.6)   Emesis/NG output  0 (0)   Stool 0 (0)    Total Output 1510 120   Net -115 +90        Stool Occurrence 2 x     Awake, alert No distress Significant oral secretions, unable to clear Moves all extremities well Neck soft  LABS: Lab Results  Component Value Date   CREATININE 0.69 09/28/2014   BUN 27* 09/28/2014   NA 140 09/28/2014   K 3.4* 09/28/2014   CL 108 09/28/2014   CO2 25 09/28/2014   Lab Results  Component Value Date   WBC 11.5* 09/28/2014   HGB 13.0 09/28/2014   HCT 38.6* 09/28/2014   MCV 87.7 09/28/2014   PLT 335 09/28/2014    IMPRESSION: - 81 y.o. male s/p extubation for neck swelling after ACDF, failed swallow and unable to clear secretions - Speech appears somewhat improved this am.  PLAN: - Can likely transfer out of ICU, but will need stepdown for continued secretion suctioning - Cont decadron for now, will likely reassess swallowing in a few days before deciding on more permanent PEG

## 2014-09-29 ENCOUNTER — Inpatient Hospital Stay (HOSPITAL_COMMUNITY): Payer: Medicare Other

## 2014-09-29 ENCOUNTER — Inpatient Hospital Stay (HOSPITAL_COMMUNITY): Payer: Medicare Other | Admitting: Certified Registered"

## 2014-09-29 DIAGNOSIS — M4712 Other spondylosis with myelopathy, cervical region: Secondary | ICD-10-CM

## 2014-09-29 DIAGNOSIS — J9601 Acute respiratory failure with hypoxia: Secondary | ICD-10-CM | POA: Diagnosis not present

## 2014-09-29 DIAGNOSIS — J690 Pneumonitis due to inhalation of food and vomit: Secondary | ICD-10-CM

## 2014-09-29 LAB — GLUCOSE, CAPILLARY
GLUCOSE-CAPILLARY: 135 mg/dL — AB (ref 70–99)
Glucose-Capillary: 130 mg/dL — ABNORMAL HIGH (ref 70–99)
Glucose-Capillary: 161 mg/dL — ABNORMAL HIGH (ref 70–99)
Glucose-Capillary: 128 mg/dL — ABNORMAL HIGH (ref 70–99)

## 2014-09-29 LAB — CBC
HEMATOCRIT: 36.4 % — AB (ref 39.0–52.0)
Hemoglobin: 12.7 g/dL — ABNORMAL LOW (ref 13.0–17.0)
MCH: 30.4 pg (ref 26.0–34.0)
MCHC: 34.9 g/dL (ref 30.0–36.0)
MCV: 87.1 fL (ref 78.0–100.0)
Platelets: 370 10*3/uL (ref 150–400)
RBC: 4.18 MIL/uL — AB (ref 4.22–5.81)
RDW: 13.3 % (ref 11.5–15.5)
WBC: 16.8 10*3/uL — ABNORMAL HIGH (ref 4.0–10.5)

## 2014-09-29 LAB — BASIC METABOLIC PANEL
Anion gap: 12 (ref 5–15)
BUN: 36 mg/dL — AB (ref 6–23)
CO2: 22 mmol/L (ref 19–32)
CREATININE: 0.81 mg/dL (ref 0.50–1.35)
Calcium: 9.3 mg/dL (ref 8.4–10.5)
Chloride: 107 mmol/L (ref 96–112)
GFR, EST NON AFRICAN AMERICAN: 82 mL/min — AB (ref >90)
GLUCOSE: 179 mg/dL — AB (ref 70–99)
Potassium: 4.2 mmol/L (ref 3.5–5.1)
Sodium: 141 mmol/L (ref 135–145)

## 2014-09-29 LAB — POCT I-STAT 3, ART BLOOD GAS (G3+)
ACID-BASE EXCESS: 1 mmol/L (ref 0.0–2.0)
Bicarbonate: 24.6 mEq/L — ABNORMAL HIGH (ref 20.0–24.0)
O2 Saturation: 100 %
Patient temperature: 37
TCO2: 26 mmol/L (ref 0–100)
pCO2 arterial: 34.7 mmHg — ABNORMAL LOW (ref 35.0–45.0)
pH, Arterial: 7.459 — ABNORMAL HIGH (ref 7.350–7.450)
pO2, Arterial: 270 mmHg — ABNORMAL HIGH (ref 80.0–100.0)

## 2014-09-29 LAB — BLOOD GAS, ARTERIAL
Acid-Base Excess: 2.8 mmol/L — ABNORMAL HIGH (ref 0.0–2.0)
Bicarbonate: 26 mEq/L — ABNORMAL HIGH (ref 20.0–24.0)
Drawn by: 22766
FIO2: 1 %
O2 Saturation: 98.5 %
PH ART: 7.488 — AB (ref 7.350–7.450)
PO2 ART: 135 mmHg — AB (ref 80.0–100.0)
Patient temperature: 98.6
TCO2: 27.1 mmol/L (ref 0–100)
pCO2 arterial: 34.7 mmHg — ABNORMAL LOW (ref 35.0–45.0)

## 2014-09-29 LAB — URINALYSIS, ROUTINE W REFLEX MICROSCOPIC
GLUCOSE, UA: NEGATIVE mg/dL
Hgb urine dipstick: NEGATIVE
Ketones, ur: 15 mg/dL — AB
Nitrite: NEGATIVE
PROTEIN: 100 mg/dL — AB
SPECIFIC GRAVITY, URINE: 1.028 (ref 1.005–1.030)
UROBILINOGEN UA: 1 mg/dL (ref 0.0–1.0)
pH: 5 (ref 5.0–8.0)

## 2014-09-29 LAB — URINE MICROSCOPIC-ADD ON

## 2014-09-29 LAB — TRIGLYCERIDES: TRIGLYCERIDES: 95 mg/dL (ref <150)

## 2014-09-29 LAB — MRSA PCR SCREENING: MRSA BY PCR: NEGATIVE

## 2014-09-29 MED ORDER — FENTANYL CITRATE 0.05 MG/ML IJ SOLN
50.0000 ug | INTRAMUSCULAR | Status: DC | PRN
Start: 2014-09-29 — End: 2014-09-30

## 2014-09-29 MED ORDER — FENTANYL CITRATE 0.05 MG/ML IJ SOLN: 50.0000 ug | INTRAMUSCULAR | Status: DC | PRN

## 2014-09-29 MED ORDER — SUCCINYLCHOLINE CHLORIDE 20 MG/ML IJ SOLN
INTRAMUSCULAR | Status: DC | PRN
  Administered 2014-09-29: 100 mg via INTRAVENOUS

## 2014-09-29 MED ORDER — PIPERACILLIN-TAZOBACTAM 3.375 G IVPB
3.3750 g | Freq: Three times a day (TID) | INTRAVENOUS | Status: DC
  Administered 2014-09-29 – 2014-10-05 (×18): 3.375 g via INTRAVENOUS
  Filled 2014-09-29 (×23): qty 50

## 2014-09-29 MED ORDER — MIDAZOLAM HCL 2 MG/2ML IJ SOLN
INTRAMUSCULAR | Status: AC
  Filled 2014-09-29: qty 2

## 2014-09-29 MED ORDER — PROPOFOL 10 MG/ML IV EMUL
0.0000 ug/kg/min | INTRAVENOUS | Status: DC
  Administered 2014-09-29: 10 ug/kg/min via INTRAVENOUS
  Filled 2014-09-29: qty 100

## 2014-09-29 MED ORDER — FENTANYL CITRATE 0.05 MG/ML IJ SOLN
INTRAMUSCULAR | Status: AC
  Administered 2014-09-29: 50 ug
  Filled 2014-09-29: qty 2

## 2014-09-29 MED ORDER — PROPOFOL 10 MG/ML IV BOLUS
INTRAVENOUS | Status: DC | PRN
  Administered 2014-09-29: 120 mg via INTRAVENOUS

## 2014-09-29 MED ORDER — VANCOMYCIN HCL IN DEXTROSE 1-5 GM/200ML-% IV SOLN
1000.0000 mg | Freq: Two times a day (BID) | INTRAVENOUS | Status: DC
  Administered 2014-09-29 – 2014-10-02 (×6): 1000 mg via INTRAVENOUS
  Filled 2014-09-29 (×8): qty 200

## 2014-09-29 NOTE — Progress Notes (Signed)
Pt self extubated; Placed on NRB, o2 sat 99%; Propofol turned off; RT at bedside; e-link MD notified; continue to monitor closely Ricardo PicklesJames, Ricardo Rivera Sara, RN

## 2014-09-29 NOTE — Progress Notes (Signed)
OT Cancellation Note  Patient Details Name: Ricardo Rivera MRN: [id number] DOB: 11/09/1932   Cancelled Treatment:    Reason Eval/Treat Not Completed: Medical issues which prohibited therapy. Pt with medical decline this AM when PT attempted therapy. Acute OT will hold today and re-attempt tomorrow if pt is medically ready.   Ricardo Rivera, Ricardo Rivera   Carney LivingLeeAnn Marie Kassaundra Rivera, OTR/L Occupational Therapist [phone] (pager)  09/29/2014, 11:51 AM

## 2014-09-29 NOTE — Procedures (Signed)
Central Venous Catheter Insertion Procedure Note Arnell AsalDwight L Ridge [id number] 11/21/1932  Procedure: Insertion of Central Venous Catheter Indications: Drug and/or fluid administration  Procedure Details Consent: Unable to obtain consent because of emergent medical necessity. Time Out: Verified patient identification, verified procedure, site/side was marked, verified correct patient position, special equipment/implants available, medications/allergies/relevent history reviewed, required imaging and test results available.  Performed  Maximum sterile technique was used including antiseptics, cap, gloves, gown, hand hygiene, mask and sheet. Skin prep: Chlorhexidine; local anesthetic administered A antimicrobial bonded/coated triple lumen catheter was placed in the right femoral vein due to multiple attempts, no other available access using the Seldinger technique.  Evaluation Blood flow good Complications: No apparent complications Patient did tolerate procedure well. Chest X-ray ordered to verify placement.  CXR: n/a.   Performed under direct MD supervision.  Performed using ultrasound guidance.  Wire visualized in vessel under ultrasound.   Dirk DressKaty Trenity Pha, NP 09/29/2014  12:49 PM

## 2014-09-29 NOTE — Procedures (Signed)
Extubation Procedure Note  Patient Details:   Name: Arnell AsalDwight L Sosa DOB: 05/20/1933 MRN: [id number]   Airway Documentation:  Patient self-extubated, NRBM applied sats maintaining at 97%.  RN turned of propofol, E-link notified.   Evaluation  O2 sats: currently acceptable Complications: No apparent complications Patient did tolerate procedure well. Bilateral Breath Sounds: Rhonchi Suctioning: Nasal, Oral Yes  Nadyne Gariepy, Aloha GellJohn P 09/29/2014, 7:44 PM

## 2014-09-29 NOTE — Progress Notes (Signed)
Patient having increased O2 demands, on Ventimask 15L/55% and sats 88-90. RR has increased to 35-low 40s, was in the low to mid 30s overnight. E-Link was contacted to camera in and assess pt. CCM paged. CCM updated on patient status and asked to come see patient.

## 2014-09-29 NOTE — Significant Event (Signed)
Received patient from floor respiratory difficulty with intubation.  No family noted with patient, clothes placed at bedside, monitor placed on patient, full assessment to follow in Epic.  Hard collar in place no hematoma noted incision C/D/I dermabond in place.  Prepare for central line placement, no fluids running at this time.  Will continue to monitor closely

## 2014-09-29 NOTE — Progress Notes (Signed)
Pt seen and examined. Events of this am were reviewed. Pt is currently intubated, on low-dose propofol  EXAM: Temp:  [97.3 F (36.3 C)-99.1 F (37.3 C)] 98.7 F (37.1 C) (03/31 1620) Pulse Rate:  [80-102] 80 (03/31 1655) Resp:  [13-39] 21 (03/31 1655) BP: (84-155)/(42-72) 98/68 mmHg (03/31 1655) SpO2:  [86 %-96 %] 96 % (03/31 1655) FiO2 (%):  [55 %-100 %] 80 % (03/31 1653) Weight:  [78.3 kg (172 lb 9.9 oz)-81.4 kg (179 lb 7.3 oz)] 81.4 kg (179 lb 7.3 oz) (03/31 1215) Intake/Output      03/30 0701 - 03/31 0700 03/31 0701 - 04/01 0700   P.O.  0   I.V. (mL/kg) 160 (2) 195.2 (2.4)   Other     NG/GT 1740 81.3   IV Piggyback  250   Total Intake(mL/kg) 1900 (24.1) 526.5 (6.5)   Urine (mL/kg/hr) 480 (0.3)    Emesis/NG output 0 (0)    Stool 0 (0)    Total Output 480     Net +1420 +526.5        Urine Occurrence 2 x    Stool Occurrence 7 x     Opens eyes to voice Will follow commands, Moves all extremities symetrically  LABS: Lab Results  Component Value Date   CREATININE 0.81 09/29/2014   BUN 36* 09/29/2014   NA 141 09/29/2014   K 4.2 09/29/2014   CL 107 09/29/2014   CO2 22 09/29/2014   Lab Results  Component Value Date   WBC 16.8* 09/29/2014   HGB 12.7* 09/29/2014   HCT 36.4* 09/29/2014   MCV 87.1 09/29/2014   PLT 370 09/29/2014    IMPRESSION: - 81 y.o. male requiring second re-intubation for stridor/? Asp PNA  PLAN: - Will likely require trach at this point. Have spoken with Dr. Lazarus SalinesWolicki from ENT, plan on Sat am. - After trach, will again re eval swallowing. Possibility of needing PEG in addition. - Will cont steroids for now. - On abx for possible aspiration PNA  I have not seen family at the bedside. I attempted to call the patient's wife but was unable to reach her. Will cont to try to reach family.

## 2014-09-29 NOTE — Progress Notes (Signed)
Speech Language Pathology Treatment: Dysphagia  Patient Details Name: Ricardo Rivera MRN: [id number] DOB: 05/06/1933 Today's Date: 09/29/2014 Time: 0810-0825 SLP Time Calculation (min) (ACUTE ONLY): 15 min  Assessment / Plan / Recommendation Clinical Impression  SLP offered minimal trials of ice/water to determine readiness for objective swallow test. Pt with persistent evidence of oropharyngeal residuals with trials (wet vocal quality) with no ability to clear despite max cues for cough, throat clear, second swallow. RR increased and O2 sats dropped to 88 with minimal activity. Pt not ready for testing or PO at this time, suspect ongoing edema impeding bolus flow through the UES.  Recommend continuation of alternate method of nutrition. SLP will continue to follow for readiness.    HPI HPI: Pt is a 81 y/o male with PMH of HTD, HLD, arthritis, prior spinal fusion (1986) wuth primary complaint of R sided hand/arm pain. Pt underwent panned cervical spine fusion of C3-C7 (09/24/14). Pt developed respiratory distress and was intubated 3/26; pt extubated 3/28. Pt has no prior hx. of dysphagia. CXR (3/28) revealed suspect small R pleural effusion and mild bibasilar atelectasis.   Pertinent Vitals    SLP Plan       Recommendations Diet recommendations: NPO              Oral Care Recommendations: Oral care Q4 per protocol Follow up Recommendations: Inpatient Rehab    GO    Midwest Eye CenterBonnie Erasto Rivera, [place]MA CCC-SLP [phone]  Ricardo Rivera, Ricardo Rivera 09/29/2014, 8:58 AM

## 2014-09-29 NOTE — Progress Notes (Signed)
PULMONARY / CRITICAL CARE MEDICINE   Name: Ricardo Rivera MRN: [id number] DOB: Aug 29, 1932    ADMISSION DATE:  09/23/2014 CONSULTATION DATE:  09/24/14  REFERRING MD :  Dr. Conchita Paris   CHIEF COMPLAINT:  VDRF   INITIAL PRESENTATION: 81 y/o M with a PMH of R sided hand / arm pain admitted on 3/25 for planned ACDF.  Early am 3/26, the patient developed agitation, hypoxia and respiratory distress.  He was emergently intubated per anesthesia (easy intubation but swollen).  PCCM consulted for ICU vent management.   STUDIES:  3/26  CT Neck >> post op prevertebral / retropharyngeal fluid & R neck hematoma, s/p C3-C7 ACDF, ETT in good position, lung apices with acute pulmonary edema  SIGNIFICANT EVENTS: 3/25  Admit for planned ACDF in setting of R sided hand / arm pain  3/26  Respiratory decompensation on floor, intubated per anesthesia  3/29 failed swallow -NG inserted 3/31- increased hypoxia, tachypnea.  ?HCAP v aspiration v atx.  Empiric abx started.    SUBJECTIVE:  Now in SDU.  Called by RN for hypoxia, tachypnea. Pt nods yes to SOB, no obvious distress.  Febrile   VITAL SIGNS: Temp:  [97.3 F (36.3 C)-99.7 F (37.6 C)] 99.1 F (37.3 C) (03/31 0757) Pulse Rate:  [93-113] 102 (03/31 0757) Resp:  [21-39] 39 (03/31 0757) BP: (132-155)/(63-76) 155/72 mmHg (03/31 0733) SpO2:  [90 %-100 %] 95 % (03/31 0757) FiO2 (%):  [55 %] 55 % (03/31 0757) Weight:  [172 lb 9.9 oz (78.3 kg)-173 lb 11.6 oz (78.8 kg)] 173 lb 11.6 oz (78.8 kg) (03/31 0500)   VENTILATOR SETTINGS: Vent Mode:  [-]  FiO2 (%):  [55 %] 55 %   INTAKE / OUTPUT:  Intake/Output Summary (Last 24 hours) at 09/29/14 1051 Last data filed at 09/29/14 0815  Gross per 24 hour  Intake 1546.25 ml  Output    330 ml  Net 1216.25 ml   PHYSICAL EXAMINATION: General:  wdwn adult male in NAD Neuro: RASS 0 C-Collar in place, follows commands, gen weakness  HEENT:  OETT, mm pink/moist, anterior bruising, no notable swelling   Cardiovascular:  s1s2 rrr, no m/r/g Lungs:  resp's even/non-labored, tachypneic, RR 35-45, lungs bilaterally clear  Abdomen:  Flat, soft, bsx4 active  Musculoskeletal:  No acute deformities  Skin:  Warm/dry, no edema   LABS:  CBC  Recent Labs Lab 09/27/14 0240 09/28/14 0254 09/29/14 0330  WBC 12.3* 11.5* 16.8*  HGB 12.3* 13.0 12.7*  HCT 36.5* 38.6* 36.4*  PLT 329 335 370   BMET  Recent Labs Lab 09/27/14 0240 09/28/14 0254 09/29/14 0330  NA 139 140 141  K 3.6 3.4* 4.2  CL 102 108 107  CO2 BUN 15 27* 36*  CREATININE 0.65 0.69 0.81  GLUCOSE 127* 142* 179*   Electrolytes  Recent Labs Lab 09/27/14 0240 09/28/14 0254 09/29/14 0330  CALCIUM 8.9 8.8 9.3   ABG  Recent Labs Lab 09/24/14 0630 09/24/14 0945 09/26/14 1140  PHART 7.220* 7.479* 7.422  PCO2ART 60.7* 32.2* 35.9  PO2ART 182.0* 197.0* 57.0*   Glucose  Recent Labs Lab 09/28/14 0344 09/28/14 0756 09/28/14 1211 09/28/14 1619 09/28/14 2301 09/29/14 0525  GLUCAP 121* 168* 135* 156* 157* 130*   Imaging No results found. ASSESSMENT / PLAN:  PULMONARY OETT 3/26 >> 3/28 A: Acute Respiratory Failure - in setting of post-op discectomy due to R neck hematoma.  Recurrent 3/31 r/t?HCAP v atx R Neck Hematoma  Acute Pulmonary Edema -resolved ?HCAP  Atx, poor cough mechanics P:   Decadron per NSGY Pulm hygiene  Add empiric abx for ?HCAP v aspiration (displaced panda overnight, no obvious aspiration) F/u CXR  Cont venti mask, wean O2 as able  NT suction per RT  ABG  Sputum culture  Tenuous respiratory status    NEUROLOGIC A:   Cervical Spondylosis with Myelopathy s/p Discectomy at C3-C7 P:   Fentanyl prn for pain  Rec's per NSGY C-Collar  PT when resp status allows   CARDIOVASCULAR CVL None A:  HTN HLD P:  Hold home gemfibrozil, losartan Monitor hemodynamics  RENAL A:   No acute issues  P:   Monitor BMP Replace electrolytes as indicated  Dc  IVFs  GASTROINTESTINAL A:   Vent Associated Dysphagia  P:   TF's when panda replaced  PPI  HEMATOLOGIC A:   R Neck Hematoma   P:  Monitor CBC SCD's for DVT   INFECTIOUS A:   ?HCAP v aspiration  P:   Vanc 3/31>>> Zosyn 3/31>>>  ENDOCRINE A:   At Risk Hypoglycemia    P:   Cont TF  Monitor CBG  FAMILY  - Updates: no family available 3/31   Dirk DressKaty Whiteheart, NP 09/29/2014  10:51 AM Pager: [phone] or [phone]  Attending Note:  I have examined patient, reviewed labs, studies and notes. I have discussed the case with Jasper RilingK Whiteheart, and I agree with the data and plans as amended above. Ricardo Rivera has had a prolonged respiratory failure s/p ACDF. He was reintubated due to stridor on 3/26, found to have a R sided hematoma. Was treated with steroids and ventilated until 3/28. Post second extubation he has been marginal due to poor airway protection and cough mechanics. His o2 needs have increased 3/31. On my eval he is awake but lethargic, very weak globally. He will not cough on command. NTS revealed copious thick secretions. We will plan to reintubate now, will ask anesthesia for assistance given his complicated neck anatomy. He is at high risk for HCAP / aspiration, will need to cover with broad spectrum abx, send cx data. Independent critical care time is 50 minutes.   Levy Pupaobert Janayla Marik, MD, PhD 09/29/2014, 11:30 AM [facility] [phone] or if no answer [phone]

## 2014-09-29 NOTE — Progress Notes (Signed)
PT Cancellation Note  Patient Details Name: Ricardo Rivera MRN: [id number] DOB: 06/29/1933   Cancelled Treatment:    Reason Eval/Treat Not Completed: Patient not medically ready.  Pt O2 sats decrease to low 80's on Venti mask simply during removing SCDs and preparing for mobility.  PT evaluation and mobility deferred at this time and will f/u tomorrow as appropriate.     Meri Pelot, Alison MurrayMegan F 09/29/2014, 10:23 AM

## 2014-09-29 NOTE — Anesthesia Procedure Notes (Signed)
Procedure Name: Intubation Date/Time: 09/29/2014 11:30 AM Performed by: Jerilee HohMUMM, Bryna Razavi N Pre-anesthesia Checklist: Patient identified, Emergency Drugs available, Suction available, Patient being monitored and Timeout performed Patient Re-evaluated:Patient Re-evaluated prior to inductionOxygen Delivery Method: Ambu bag Preoxygenation: Pre-oxygenation with 100% oxygen Intubation Type: IV induction, Rapid sequence and Cricoid Pressure applied Laryngoscope Size: Glidescope Grade View: Grade I Tube type: Subglottic suction tube Tube size: 8.0 mm Number of attempts: 1 Airway Equipment and Method: Rigid stylet and Video-laryngoscopy Placement Confirmation: ETT inserted through vocal cords under direct vision,  breath sounds checked- equal and bilateral and CO2 detector Secured at: 23 cm Tube secured with: Tape Dental Injury: Teeth and Oropharynx as per pre-operative assessment  Comments: Called to patient's room on 3S for elective, urgent intubation. Patient drowsy, but responsive to voice command on NRB mask. O2 sat 90%, BP 151/75, HR 80 on arrival. RN informed anesthesia that tube feedings were stopped at 0930.  Timeout performed. RSI by Dr. Renold DonGermeroth while holding cricoid pressure. DL with Glidescope by CRNA, Grade I view, maintained neutral cervical spine position. Atraumatic oral intubation. + color change on CO2 detector, +BBS. ETT taped at 23 cm by RT and airway management handed off to RT. VSS throughout procedure.

## 2014-09-29 NOTE — Progress Notes (Signed)
eLink Physician-Brief Progress Note Patient Name: Ricardo Rivera DOB: 07/27/1932 MRN: [id number]   Date of Service  09/29/2014  HPI/Events of Note  Patient self extubated, currently stable on NRB  eICU Interventions  Cont with NRB, will monitor closely, Low threshold for reintubation.      Intervention Category Intermediate Interventions: Respiratory distress - evaluation and management  Joab Carden 09/29/2014, 7:43 PM

## 2014-09-29 NOTE — Consult Note (Signed)
ANTIBIOTIC CONSULT NOTE - INITIAL  Pharmacy Consult for Vancomycin and Zosyn Indication: HCAP  Allergies  Allergen Reactions  . Flexeril [Cyclobenzaprine] Other (See Comments)    Hallucinations  . Tramadol Other (See Comments)    Hallunications    Patient Measurements: Height: 5\' 11"  (180.3 cm) Weight: 173 lb 11.6 oz (78.8 kg) IBW/kg (Calculated) : 75.3  Vital Signs: Temp: 99.1 F (37.3 C) (03/31 0757) Temp Source: Axillary (03/31 0757) BP: 155/72 mmHg (03/31 0733) Pulse Rate: 102 (03/31 0757) Intake/Output from previous day: 03/30 0701 - 03/31 0700 In: 1900 [I.V.:160; NG/GT:1740] Out: 480 [Urine:480] Intake/Output from this shift: Total I/O In: 81.3 [NG/GT:81.3] Out: -   Labs:  Recent Labs  09/27/14 0240 09/28/14 0254 09/29/14 0330  WBC 12.3* 11.5* 16.8*  HGB 12.3* 13.0 12.7*  PLT 329 335 370  CREATININE 0.65 0.69 0.81   Estimated Creatinine Clearance: 78.8 mL/min (by C-G formula based on Cr of 0.81).  Medical History: Past Medical History  Diagnosis Date  . Hypertension   . Shortness of breath dyspnea   . Arthritis   . Hyperlipemia   . Frequent urination   . Nocturia    Assessment: 81yom s/p spinal surgery on 3/25 now with hypoxia and increasing WBC. CXR shows persistent bibasilar infiltrates. He will begin vancomycin and zosyn for probable HCAP. Renal function stable.  Goal of Therapy:  Vancomycin trough level 15-20 mcg/ml  Plan:  1) Vancomycin 1g IV q12 2) Zosyn 3.375g IV q8 (4 hour infusion) 3) Follow renal function, cultures, LOT, level if needed  Fredrik RiggerMarkle, Kerith Sherley Sue 09/29/2014,11:03 AM

## 2014-09-29 NOTE — Progress Notes (Signed)
While on rounds made aware of patient with increased O2 needs overnight now on NRB mask with decreased LOC.  To room to see patient - critical care NP Katie present - patinet with NRB mask  - marginal O2 sats - 88-93% shallow rapid respirations.  Suctioned by RT for moderate amount thick green secretions.  RR 32 HR 98 BP 92/53.  ABG and PCXR done.  Cervical collar intact - no swelling noted at incision site.  PCCM Dr. Delton CoombesByrum present.  Whitney RN to call as needed.

## 2014-09-29 NOTE — Progress Notes (Signed)
CCM NP at bedside to assess patient. RT at bedside, nasotracheal suctioned patient per RT and large amount of pale green secretions withdrawn, sputum cultured. Blood gas drawn. Patient to be intubated by anesthesia and transferred to ICU, per CCM MD. Intubated by anesthesia at bedside. 50 mcg of Fentanyl administered by Wylene MenLacey, RN.  Rapid Response RN made aware of situation. Pt transferred on monitor with RN and 2 RT to 37M 09. Family made aware and updated.

## 2014-09-29 NOTE — Transfer of Care (Signed)
Immediate Anesthesia Transfer of Care Note  Patient: Ricardo Rivera  Procedure(s) Performed: * No procedures listed *  Patient Location: ICU  Anesthesia Type:Propofol for intubation  Level of Consciousness: sedated and Patient remains intubated per anesthesia plan  Airway & Oxygen Therapy: Patient remains intubated per anesthesia plan and Patient placed on Ventilator (see vital sign flow sheet for setting)  Post-op Assessment: Report given to RN and Post -op Vital signs reviewed and stable  Post vital signs: Reviewed and stable  Last Vitals:  Filed Vitals:   09/29/14 0757  BP:   Pulse: 102  Temp: 37.3 C  Resp: 39    Complications: No apparent anesthesia complications

## 2014-09-30 ENCOUNTER — Inpatient Hospital Stay (HOSPITAL_COMMUNITY): Payer: Medicare Other

## 2014-09-30 LAB — BASIC METABOLIC PANEL
ANION GAP: 8 (ref 5–15)
BUN: 39 mg/dL — AB (ref 6–23)
CHLORIDE: 110 mmol/L (ref 96–112)
CO2: 28 mmol/L (ref 19–32)
Calcium: 9.2 mg/dL (ref 8.4–10.5)
Creatinine, Ser: 0.9 mg/dL (ref 0.50–1.35)
GFR, EST NON AFRICAN AMERICAN: 79 mL/min — AB (ref >90)
Glucose, Bld: 123 mg/dL — ABNORMAL HIGH (ref 70–99)
Potassium: 3.9 mmol/L (ref 3.5–5.1)
Sodium: 146 mmol/L — ABNORMAL HIGH (ref 135–145)

## 2014-09-30 LAB — GLUCOSE, CAPILLARY
GLUCOSE-CAPILLARY: 107 mg/dL — AB (ref 70–99)
GLUCOSE-CAPILLARY: 149 mg/dL — AB (ref 70–99)
GLUCOSE-CAPILLARY: 99 mg/dL (ref 70–99)
Glucose-Capillary: 134 mg/dL — ABNORMAL HIGH (ref 70–99)
Glucose-Capillary: 109 mg/dL — ABNORMAL HIGH (ref 70–99)
Glucose-Capillary: 126 mg/dL — ABNORMAL HIGH (ref 70–99)
Glucose-Capillary: 152 mg/dL — ABNORMAL HIGH (ref 70–99)

## 2014-09-30 LAB — CBC
HCT: 37.7 % — ABNORMAL LOW (ref 39.0–52.0)
Hemoglobin: 12.5 g/dL — ABNORMAL LOW (ref 13.0–17.0)
MCH: 29.8 pg (ref 26.0–34.0)
MCHC: 33.2 g/dL (ref 30.0–36.0)
MCV: 89.8 fL (ref 78.0–100.0)
PLATELETS: 375 10*3/uL (ref 150–400)
RBC: 4.2 MIL/uL — AB (ref 4.22–5.81)
RDW: 13.7 % (ref 11.5–15.5)
WBC: 19.5 10*3/uL — ABNORMAL HIGH (ref 4.0–10.5)

## 2014-09-30 MED ORDER — ADULT MULTIVITAMIN LIQUID CH
5.0000 mL | Freq: Every day | ORAL | Status: DC
  Administered 2014-09-30 – 2014-10-05 (×6): 5 mL
  Filled 2014-09-30 (×6): qty 5

## 2014-09-30 MED ORDER — FENTANYL CITRATE 0.05 MG/ML IJ SOLN
INTRAMUSCULAR | Status: AC
  Filled 2014-09-30: qty 4

## 2014-09-30 MED ORDER — SODIUM CHLORIDE 0.9 % IV SOLN
25.0000 ug/h | INTRAVENOUS | Status: DC
  Administered 2014-09-30: 25 ug/h via INTRAVENOUS
  Administered 2014-10-01: 50 ug/h via INTRAVENOUS
  Administered 2014-10-03: 75 ug/h via INTRAVENOUS
  Filled 2014-09-30 (×4): qty 50

## 2014-09-30 MED ORDER — MIDAZOLAM HCL 2 MG/2ML IJ SOLN
2.0000 mg | Freq: Once | INTRAMUSCULAR | Status: AC
Start: 2014-09-30 — End: 2014-09-30
  Administered 2014-09-30: 2 mg via INTRAVENOUS

## 2014-09-30 MED ORDER — MIDAZOLAM HCL 2 MG/2ML IJ SOLN
2.0000 mg | Freq: Once | INTRAMUSCULAR | Status: AC
  Administered 2014-09-30: 2 mg via INTRAVENOUS

## 2014-09-30 MED ORDER — PROPOFOL 10 MG/ML IV EMUL
0.0000 ug/kg/min | INTRAVENOUS | Status: DC
  Administered 2014-10-01: 15 ug/kg/min via INTRAVENOUS
  Administered 2014-10-02: 10 ug/kg/min via INTRAVENOUS
  Filled 2014-09-30 (×2): qty 100

## 2014-09-30 MED ORDER — FENTANYL CITRATE 0.05 MG/ML IJ SOLN
100.0000 ug | Freq: Once | INTRAMUSCULAR | Status: AC
  Administered 2014-09-30: 100 ug via INTRAVENOUS

## 2014-09-30 MED ORDER — FENTANYL CITRATE 0.05 MG/ML IJ SOLN
100.0000 ug | Freq: Once | INTRAMUSCULAR | Status: AC
Start: 2014-09-30 — End: 2014-09-30
  Administered 2014-09-30: 100 ug via INTRAVENOUS

## 2014-09-30 MED ORDER — CHLORHEXIDINE GLUCONATE 4 % EX LIQD
1.0000 "application " | Freq: Once | CUTANEOUS | Status: AC
  Administered 2014-10-01: 1 via TOPICAL
  Filled 2014-09-30: qty 15

## 2014-09-30 MED ORDER — ETOMIDATE 2 MG/ML IV SOLN: 20.0000 mg | Freq: Once | INTRAVENOUS | Status: AC

## 2014-09-30 MED ORDER — DEXTROSE 5 % IV SOLN
2.0000 ug/min | INTRAVENOUS | Status: DC
  Administered 2014-09-30: 2 ug/min via INTRAVENOUS
  Filled 2014-09-30: qty 4

## 2014-09-30 MED ORDER — PROPOFOL 10 MG/ML IV EMUL
5.0000 ug/kg/min | INTRAVENOUS | Status: DC
  Administered 2014-09-30: 5 ug/kg/min via INTRAVENOUS

## 2014-09-30 MED ORDER — FENTANYL CITRATE 0.05 MG/ML IJ SOLN
INTRAMUSCULAR | Status: AC
  Filled 2014-09-30: qty 2

## 2014-09-30 MED ORDER — MIDAZOLAM HCL 2 MG/2ML IJ SOLN
INTRAMUSCULAR | Status: AC
  Filled 2014-09-30: qty 4

## 2014-09-30 MED ORDER — FENTANYL CITRATE 0.05 MG/ML IJ SOLN: 50.0000 ug | Freq: Once | INTRAMUSCULAR | Status: AC

## 2014-09-30 MED ORDER — CHLORHEXIDINE GLUCONATE 0.12 % MT SOLN
15.0000 mL | Freq: Two times a day (BID) | OROMUCOSAL | Status: DC
  Administered 2014-09-30 – 2014-10-05 (×11): 15 mL via OROMUCOSAL
  Filled 2014-09-30 (×10): qty 15

## 2014-09-30 MED ORDER — CHLORHEXIDINE GLUCONATE 4 % EX LIQD
1.0000 "application " | Freq: Once | CUTANEOUS | Status: AC
  Administered 2014-09-30: 1 via TOPICAL
  Filled 2014-09-30: qty 15

## 2014-09-30 MED ORDER — MIDAZOLAM HCL 2 MG/2ML IJ SOLN
INTRAMUSCULAR | Status: AC
  Filled 2014-09-30: qty 2

## 2014-09-30 MED ORDER — PROPOFOL 10 MG/ML IV EMUL
INTRAVENOUS | Status: AC
  Filled 2014-09-30: qty 100

## 2014-09-30 MED ORDER — CETYLPYRIDINIUM CHLORIDE 0.05 % MT LIQD
7.0000 mL | Freq: Two times a day (BID) | OROMUCOSAL | Status: DC
  Administered 2014-09-30 – 2014-10-03 (×8): 7 mL via OROMUCOSAL

## 2014-09-30 MED ORDER — FENTANYL BOLUS VIA INFUSION
25.0000 ug | INTRAVENOUS | Status: DC | PRN
  Administered 2014-09-30: 25 ug via INTRAVENOUS
  Administered 2014-10-03: 50 ug via INTRAVENOUS
  Filled 2014-09-30: qty 25

## 2014-09-30 NOTE — Procedures (Signed)
Intubation Procedure Note Ricardo AsalDwight L Lesko [id number] 09/20/1932  Procedure: Intubation Indications: Respiratory insufficiency  Procedure Details Consent: Risks of procedure as well as the alternatives and risks of each were explained to the (patient/caregiver).  Consent for procedure obtained. Time Out: Verified patient identification, verified procedure, site/side was marked, verified correct patient position, special equipment/implants available, medications/allergies/relevent history reviewed, required imaging and test results available.  Performed  Maximum sterile technique was used including gloves, gown, hand hygiene and mask.  MAC and 4    Evaluation Hemodynamic Status: BP stable throughout; O2 sats: stable throughout Patient's Current Condition: stable Complications: No apparent complications Patient did tolerate procedure well. Chest X-ray ordered to verify placement.  CXR: tube position acceptable.   ALVA,RAKESH V. 09/30/2014

## 2014-09-30 NOTE — Progress Notes (Signed)
OT Cancellation Note  Patient Details Name: Arnell AsalDwight L Caputi MRN: [id number] DOB: 07/21/1932   Cancelled Treatment:    Reason Eval/Treat Not Completed: Patient not medically ready (reintubated, signing off.)  Evern BioMayberry, Donel Osowski Lynn 09/30/2014, 10:40 AM

## 2014-09-30 NOTE — Progress Notes (Signed)
Pt seen and examined. Pt self-extubated overnight. Relatively stable now.  EXAM: Temp:  [97.6 F (36.4 C)-98.7 F (37.1 C)] 97.6 F (36.4 C) (04/01 0906) Pulse Rate:  [76-98] 86 (04/01 0900) Resp:  [13-31] 30 (04/01 0900) BP: (84-182)/(42-82) 139/66 mmHg (04/01 0900) SpO2:  [86 %-100 %] 92 % (04/01 0900) FiO2 (%):  [80 %-100 %] 100 % (04/01 0700) Weight:  [79.7 kg (175 lb 11.3 oz)-81.4 kg (179 lb 7.3 oz)] 79.7 kg (175 lb 11.3 oz) (04/01 0315) Intake/Output      03/31 0701 - 04/01 0700 04/01 0701 - 04/02 0700   P.O. 0    I.V. (mL/kg) 952.4 (12) 100 (1.3)   NG/GT 81.3    IV Piggyback 550    Total Intake(mL/kg) 1583.7 (19.9) 100 (1.3)   Urine (mL/kg/hr) 1190 (0.6) 100 (0.4)   Emesis/NG output     Stool     Total Output 1190 100   Net +393.7 0         Awake, alert Appears to be somewhat labored in breathing Answers questions appropriately, follows commands Moves all extremities  LABS: Lab Results  Component Value Date   CREATININE 0.90 09/30/2014   BUN 39* 09/30/2014   NA 146* 09/30/2014   K 3.9 09/30/2014   CL 110 09/30/2014   CO2 28 09/30/2014   Lab Results  Component Value Date   WBC 19.5* 09/30/2014   HGB 12.5* 09/30/2014   HCT 37.7* 09/30/2014   MCV 89.8 09/30/2014   PLT 375 09/30/2014    IMPRESSION: - 81 y.o. male with significant neck edema following 4 level ACDF requiring multiple intubations, still appears somewhat labored.  PLAN: - Will plan on elective re-intubation and still plan on tracheostomy tomorrow.  I have seen the patient and discussed the plan above with both CCM and Dr. Lazarus SalinesWolicki from ENT. No family is available currently, I will attempt to call his wife again today.

## 2014-09-30 NOTE — Progress Notes (Signed)
PT Cancellation Note  Patient Details Name: Ricardo Rivera MRN: [id number] DOB: 06/02/1933   Cancelled Treatment:    Reason Eval/Treat Not Completed: Patient not medically ready Spoke with RN. Pt re-intubated with plan for trach. Will hold PT evaluation until patient is medically ready.  Berton MountBarbour, Nasim Habeeb S 09/30/2014, 3:11 PM Ricardo SpillersLogan Secor DaleBarbour, South CarolinaPT [phone]

## 2014-09-30 NOTE — Consult Note (Signed)
Ricardo Rivera, Ricardo Rivera 81 y.o., male [id number]     Chief Complaint: respiratory distress  HPI: 81 yo wm, underwent 4 level ACDF last week.  Did well initially, then develop large wound edema and hematoma and required intubation for airway support.  Extubated after 2-3 days, then developed possible aspiration pneumonia and required re-intubation 2 days ago.  Self extubated last evening and now with difficulty with respiratory effort and with airway protection.  Cough weak and moist.  No known vocal cord paralysis.  Critical Care Medicine and Neurosurgery both in favor of proceding.  PMH: Past Medical History  Diagnosis Date  . Hypertension   . Shortness of breath dyspnea   . Arthritis   . Hyperlipemia   . Frequent urination   . Nocturia     Surg Hx: Past Surgical History  Procedure Laterality Date  . Spinal surgery      fusion  . Spinal fusion  1986  . Shoulder surgies Bilateral   . Lower back  1970  . Cholecystectomy  1998  . Anterior cervical decompression/discectomy fusion 4 levels N/A 09/23/2014    Procedure: CERVICAL THREE-FOUR,CERVICAL FOUR-FIVE,CERVICAL FIVE-SIX,CERVICAL SIX-SEVEN,ANTERIOR CERVICAL DECOMPRESSION WITH FUSION INTERBODY PROSTHESIS PLATING AND BONEGRAFT.;  Surgeon: Consuella Lose, MD;  Location: [facility];  Service: Neurosurgery;  Laterality: N/A;    FHx:  History reviewed. No pertinent family history. SocHx:  reports that he has been smoking.  He has never used smokeless tobacco. He reports that he does not drink alcohol or use illicit drugs.  ALLERGIES:  Allergies  Allergen Reactions  . Flexeril [Cyclobenzaprine] Other (See Comments)    Hallucinations  . Tramadol Other (See Comments)    Hallunications    Medications Prior to Admission  Medication Sig Dispense Refill  . acetaminophen (TYLENOL) 500 MG tablet Take 500 mg by mouth every 6 (six) hours as needed for mild pain or moderate pain.     Marland Kitchen ALPRAZolam (XANAX) 0.5 MG tablet Take 0.5 mg by mouth at  bedtime as needed for sleep.     Marland Kitchen aspirin 81 MG tablet Take 81 mg by mouth daily.      . brimonidine (ALPHAGAN P) 0.1 % SOLN Place 1 drop into both eyes 3 (three) times daily.     . Cholecalciferol (VITAMIN D) 2000 UNITS CAPS Take 2,000 Units by mouth daily.     Marland Kitchen gemfibrozil (LOPID) 600 MG tablet Take 600 mg by mouth 2 (two) times daily before a meal.      . HYDROcodone-acetaminophen (NORCO/VICODIN) 5-325 MG per tablet Take 1-2 tablets by mouth every 4 (four) hours as needed. (Patient taking differently: Take 1 tablet by mouth every 4 (four) hours as needed for moderate pain. ) 60 tablet 0  . latanoprost (XALATAN) 0.005 % ophthalmic solution Place 1 drop into both eyes at bedtime.    Marland Kitchen losartan (COZAAR) 50 MG tablet Take 50 mg by mouth daily.      . Multiple Vitamins-Minerals (CENTRUM SILVER ADULT 50+ PO) Take 1 tablet by mouth daily.    Marland Kitchen oxybutynin (DITROPAN) 5 MG tablet Take 5 mg by mouth daily.    . pantoprazole (PROTONIX) 40 MG tablet Take 40 mg by mouth daily.    Marland Kitchen gabapentin (NEURONTIN) 300 MG capsule Take 1 capsule (300 mg total) by mouth at bedtime. (Patient not taking: Reported on 09/01/2014) 30 capsule 0    Results for orders placed or performed during the hospital encounter of 09/23/14 (from the past 48 hour(s))  Glucose, capillary  Status: Abnormal   Collection Time: 09/28/14 12:11 PM  Result Value Ref Range   Glucose-Capillary 135 (H) 70 - 99 mg/dL  Clostridium Difficile by PCR     Status: None   Collection Time: 09/28/14 12:30 PM  Result Value Ref Range   C difficile by pcr NEGATIVE NEGATIVE  Glucose, capillary     Status: Abnormal   Collection Time: 09/28/14  4:19 PM  Result Value Ref Range   Glucose-Capillary 156 (H) 70 - 99 mg/dL  Glucose, capillary     Status: Abnormal   Collection Time: 09/28/14 11:01 PM  Result Value Ref Range   Glucose-Capillary 157 (H) 70 - 99 mg/dL  Basic metabolic panel     Status: Abnormal   Collection Time: 09/29/14  3:30 AM  Result  Value Ref Range   Sodium 141 135 - 145 mmol/L   Potassium 4.2 3.5 - 5.1 mmol/L    Comment: DELTA CHECK NOTED   Chloride 107 96 - 112 mmol/L   CO2 22 19 - 32 mmol/L   Glucose, Bld 179 (H) 70 - 99 mg/dL   BUN 36 (H) 6 - 23 mg/dL   Creatinine, Ser 0.81 0.50 - 1.35 mg/dL   Calcium 9.3 8.4 - 10.5 mg/dL   GFR calc non Af Amer 82 (L) >90 mL/min   GFR calc Af Amer >90 >90 mL/min    Comment: (NOTE) The eGFR has been calculated using the CKD EPI equation. This calculation has not been validated in all clinical situations. eGFR's persistently <90 mL/min signify possible Chronic Kidney Disease.    Anion gap 12 5 - 15  CBC     Status: Abnormal   Collection Time: 09/29/14  3:30 AM  Result Value Ref Range   WBC 16.8 (H) 4.0 - 10.5 K/uL   RBC 4.18 (L) 4.22 - 5.81 MIL/uL   Hemoglobin 12.7 (L) 13.0 - 17.0 g/dL   HCT 36.4 (L) 39.0 - 52.0 %   MCV 87.1 78.0 - 100.0 fL   MCH 30.4 26.0 - 34.0 pg   MCHC 34.9 30.0 - 36.0 g/dL   RDW 13.3 11.5 - 15.5 %   Platelets 370 150 - 400 K/uL  Glucose, capillary     Status: Abnormal   Collection Time: 09/29/14  5:25 AM  Result Value Ref Range   Glucose-Capillary 130 (H) 70 - 99 mg/dL  Blood gas, arterial     Status: Abnormal   Collection Time: 09/29/14 11:02 AM  Result Value Ref Range   FIO2 1.00 %   Delivery systems NON-REBREATHER OXYGEN MASK    pH, Arterial 7.488 (H) 7.350 - 7.450   pCO2 arterial 34.7 (L) 35.0 - 45.0 mmHg   pO2, Arterial 135.0 (H) 80.0 - 100.0 mmHg   Bicarbonate 26.0 (H) 20.0 - 24.0 mEq/L   TCO2 27.1 0 - 100 mmol/L   Acid-Base Excess 2.8 (H) 0.0 - 2.0 mmol/L   O2 Saturation 98.5 %   Patient temperature 98.6    Collection site RIGHT RADIAL    Drawn by [phone]    Sample type ARTERIAL DRAW    Allens test (pass/fail) PASS PASS  Glucose, capillary     Status: Abnormal   Collection Time: 09/29/14 11:59 AM  Result Value Ref Range   Glucose-Capillary 134 (H) 70 - 99 mg/dL  MRSA PCR Screening     Status: None   Collection Time: 09/29/14  12:02 PM  Result Value Ref Range   MRSA by PCR NEGATIVE NEGATIVE    Comment:  The GeneXpert MRSA Assay (FDA approved for NASAL specimens only), is one component of a comprehensive MRSA colonization surveillance program. It is not intended to diagnose MRSA infection nor to guide or monitor treatment for MRSA infections.   Culture, respiratory (NON-Expectorated)     Status: None (Preliminary result)   Collection Time: 09/29/14 12:14 PM  Result Value Ref Range   Specimen Description TRACHEAL ASPIRATE    Special Requests NONE    Gram Stain      MODERATE WBC PRESENT,BOTH PMN AND MONONUCLEAR RARE SQUAMOUS EPITHELIAL CELLS PRESENT MODERATE GRAM NEGATIVE RODS FEW GRAM POSITIVE COCCI IN PAIRS IN CLUSTERS    Culture PENDING    Report Status PENDING   Urinalysis, Routine w reflex microscopic     Status: Abnormal   Collection Time: 09/29/14  2:15 PM  Result Value Ref Range   Color, Urine ORANGE (A) YELLOW    Comment: BIOCHEMICALS MAY BE AFFECTED BY COLOR   APPearance CLEAR CLEAR   Specific Gravity, Urine 1.028 1.005 - 1.030   pH 5.0 5.0 - 8.0   Glucose, UA NEGATIVE NEGATIVE mg/dL   Hgb urine dipstick NEGATIVE NEGATIVE   Bilirubin Urine SMALL (A) NEGATIVE   Ketones, ur 15 (A) NEGATIVE mg/dL   Protein, ur 100 (A) NEGATIVE mg/dL   Urobilinogen, UA 1.0 0.0 - 1.0 mg/dL   Nitrite NEGATIVE NEGATIVE   Leukocytes, UA TRACE (A) NEGATIVE  Urine microscopic-add on     Status: Abnormal   Collection Time: 09/29/14  2:15 PM  Result Value Ref Range   Squamous Epithelial / LPF RARE RARE   WBC, UA 0-2 <3 WBC/hpf   RBC / HPF 0-2 <3 RBC/hpf   Bacteria, UA MANY (A) RARE   Urine-Other MUCOUS PRESENT   Triglycerides     Status: None   Collection Time: 09/29/14  2:29 PM  Result Value Ref Range   Triglycerides 95 <150 mg/dL  I-STAT 3, arterial blood gas (G3+)     Status: Abnormal   Collection Time: 09/29/14  3:38 PM  Result Value Ref Range   pH, Arterial 7.459 (H) 7.350 - 7.450   pCO2  arterial 34.7 (L) 35.0 - 45.0 mmHg   pO2, Arterial 270.0 (H) 80.0 - 100.0 mmHg   Bicarbonate 24.6 (H) 20.0 - 24.0 mEq/L   TCO2 26 0 - 100 mmol/L   O2 Saturation 100.0 %   Acid-Base Excess 1.0 0.0 - 2.0 mmol/L   Patient temperature 37.0 C    Collection site RADIAL, ALLEN'S TEST ACCEPTABLE    Drawn by RT    Sample type ARTERIAL   Culture, respiratory (NON-Expectorated)     Status: None (Preliminary result)   Collection Time: 09/29/14  3:58 PM  Result Value Ref Range   Specimen Description TRACHEAL ASPIRATE    Special Requests NONE    Gram Stain      FEW WBC PRESENT,BOTH PMN AND MONONUCLEAR RARE SQUAMOUS EPITHELIAL CELLS PRESENT FEW GRAM POSITIVE COCCI IN PAIRS IN CLUSTERS RARE GRAM NEGATIVE RODS Performed at [facility]    Culture PENDING    Report Status PENDING   Glucose, capillary     Status: Abnormal   Collection Time: 09/29/14  4:17 PM  Result Value Ref Range   Glucose-Capillary 161 (H) 70 - 99 mg/dL   Comment 1 Notify RN   Glucose, capillary     Status: Abnormal   Collection Time: 09/29/14  8:19 PM  Result Value Ref Range   Glucose-Capillary 128 (H) 70 - 99 mg/dL  Comment 1 Notify RN    Comment 2 Document in Chart   Glucose, capillary     Status: Abnormal   Collection Time: 09/29/14 11:59 PM  Result Value Ref Range   Glucose-Capillary 149 (H) 70 - 99 mg/dL   Comment 1 Notify RN    Comment 2 Document in Chart   Glucose, capillary     Status: Abnormal   Collection Time: 09/30/14  5:06 AM  Result Value Ref Range   Glucose-Capillary 107 (H) 70 - 99 mg/dL  Basic metabolic panel     Status: Abnormal   Collection Time: 09/30/14  5:30 AM  Result Value Ref Range   Sodium 146 (H) 135 - 145 mmol/L   Potassium 3.9 3.5 - 5.1 mmol/L   Chloride 110 96 - 112 mmol/L   CO2 28 19 - 32 mmol/L   Glucose, Bld 123 (H) 70 - 99 mg/dL   BUN 39 (H) 6 - 23 mg/dL   Creatinine, Ser 0.90 0.50 - 1.35 mg/dL   Calcium 9.2 8.4 - 10.5 mg/dL   GFR calc non Af Amer 79 (L) >90 mL/min    GFR calc Af Amer >90 >90 mL/min    Comment: (NOTE) The eGFR has been calculated using the CKD EPI equation. This calculation has not been validated in all clinical situations. eGFR's persistently <90 mL/min signify possible Chronic Kidney Disease.    Anion gap 8 5 - 15  CBC     Status: Abnormal   Collection Time: 09/30/14  5:30 AM  Result Value Ref Range   WBC 19.5 (H) 4.0 - 10.5 K/uL   RBC 4.20 (L) 4.22 - 5.81 MIL/uL   Hemoglobin 12.5 (L) 13.0 - 17.0 g/dL   HCT 37.7 (L) 39.0 - 52.0 %   MCV 89.8 78.0 - 100.0 fL   MCH 29.8 26.0 - 34.0 pg   MCHC 33.2 30.0 - 36.0 g/dL   RDW 13.7 11.5 - 15.5 %   Platelets 375 150 - 400 K/uL  Glucose, capillary     Status: None   Collection Time: 09/30/14  8:26 AM  Result Value Ref Range   Glucose-Capillary 99 70 - 99 mg/dL   Dg Chest Portable 1 View  09/30/2014   CLINICAL DATA:  Respiratory failure.  Hypertension.  EXAM: PORTABLE CHEST - 1 VIEW  COMPARISON:  09/29/2014  FINDINGS: Endotracheal tube has been removed. Placement of feeding tube and the tip is in the stomach fundus region. There are new or increased patchy densities at the left lung base. Few densities at the right lung base may represent atelectasis. Upper lungs are clear. Heart size is normal. Again noted is a surgical plate and hardware in the lower cervical spine.  IMPRESSION: Increased densities at the left lung base are concerning for developing airspace disease or aspiration. Recommend follow-up to evaluate this area.  Placement of feeding tube with the tip in the stomach. Removal of endotracheal tube.   Electronically Signed   By: Markus Daft M.D.   On: 09/30/2014 07:46   Dg Chest Port 1 View  09/29/2014   CLINICAL DATA:  Acute respiratory failure.  Intubation.  EXAM: PORTABLE CHEST - 1 VIEW  COMPARISON:  09/29/2014  FINDINGS: A new endotracheal tube is seen just below the level of the clavicles with the tip approximately 5.5 cm above the carina. Previously seen feeding tube is been  removed. Cervical spine fusion hardware again noted.  Decreased atelectasis noted in the right lung base. Persistent subsegmental atelectasis seen in  the left lower lung, without significant change. No evidence of pulmonary consolidation or pleural effusion. No evidence of pneumothorax. Heart size remains stable.  IMPRESSION: Endotracheal tube tip approximately 5.5 cm above carina.  Stable left lower lung atelectasis. Decreased right basilar atelectasis.   Electronically Signed   By: Earle Gell M.D.   On: 09/29/2014 13:29   Dg Chest Port 1 View  09/29/2014   CLINICAL DATA:  Aspiration.  EXAM: PORTABLE CHEST - 1 VIEW  COMPARISON:  09/26/2014, 09/24/2014.  FINDINGS: Interim extubation. Interim placement of feeding tube. Its tip is at the gastroesophageal junction. More distal placement suggested. Heart size stable. Persistent bibasilar atelectasis and infiltrates. No pleural effusion or pneumothorax. Prior cervical spine fusion.  IMPRESSION: 1. Interim extubation. Interim placement of feeding tube. Its tip is at the gastroesophageal junction. More distal placement suggested. 2. Persistent bibasilar atelectasis and/or infiltrates. Critical Value/emergent results were called by telephone at the time of interpretation on 09/29/2014 at 7:54 am to nurse Celenia, who verbally acknowledged these results.   Electronically Signed   By: Marcello Moores  Register   On: 09/29/2014 07:57   Dg Abd Portable 1v  09/29/2014   CLINICAL DATA:  Feeding tube placement.  EXAM: PORTABLE ABDOMEN - 1 VIEW  COMPARISON:  Radiograph dated 09/27/2014  FINDINGS: Feeding tube tip is in the fundus of the stomach. Visualized bowel gas pattern is normal. Endotracheal tube is at the thoracic inlet. Visualized portions of the lungs are clear.  IMPRESSION: Feeding tube tip in the fundus of the stomach.   Electronically Signed   By: Lorriane Shire M.D.   On: 09/29/2014 17:10    Blood pressure 139/66, pulse 86, temperature 97.6 F (36.4 C), temperature  source Oral, resp. rate 30, height _0  (1.803 m), weight 79.7 kg (175 lb 11.3 oz), SpO2 92 %.  PHYSICAL EXAM: Overall appearance:  Perhaps somewhat confused and combative.  Voice weak, cough also weak.   Head:  NCAT Ears:not examined Nose: not examined Oral Cavity: not examined Oral Pharynx/Hypopharynx/Larynx:  Not examined Neuro:  Grossly intact Neck:  Hard cervical collar.  Intact RIGHT horizontal incision, healing well.  Nl palpable low neck anatomy    Assessment/Plan Reintubation with anticipated prolonged need for airway support and pulmonary toilet.  Plan for tracheostomy under general anesthesia tomorrow AM.  Will add direct laryngoscopy to assess for possible vocal cord paralysis which would complicate recovery and prognosis.    Discussed with step daughter.  Family will be later today to sign consent.    Jodi Marble 03/09/6721, 10:16 AM

## 2014-09-30 NOTE — Progress Notes (Signed)
Patient upper airway "gargling". Patient cooperative for suctioning deep in the back of his throat. Will continue to cough and deep breath with patient as neccessary for clearing his airway. Remains on NRB, sats 92-92%. Will continue to monitor very closely as Ricardo Rivera is high risk for aspiration.

## 2014-09-30 NOTE — Progress Notes (Signed)
Patient intubated by physician. Respiratory and Nursing at bedside to assist.  Patient currently stable, bp 90/45.  IV sedation gtt restarted as per MD order. Will continue to monitor patient very closely.

## 2014-09-30 NOTE — Progress Notes (Signed)
Upon report this am tube feeds were being held due to self extubation last night. Plan to discuss with MD during rounds this AM. Spoke with pharmacy concerning medications which could be changed to suspension form for administration through panda tube. Will continue to monitor patient closely.

## 2014-09-30 NOTE — Progress Notes (Signed)
PULMONARY / CRITICAL CARE MEDICINE   Name: Ricardo Rivera MRN: [id number] DOB: [birth year]    ADMISSION DATE:  09/23/2014 CONSULTATION DATE:  09/24/14  REFERRING MD :  Dr. Conchita Paris   CHIEF COMPLAINT:  VDRF   INITIAL PRESENTATION: 81 y/o M with a PMH of R sided hand / arm pain admitted on 3/25 for planned ACDF.  Early am 3/26, the patient developed agitation, hypoxia and respiratory distress.  He was emergently intubated per anesthesia (easy intubation but swollen).  PCCM consulted for ICU vent management.   STUDIES:  3/26  CT Neck >> post op prevertebral / retropharyngeal fluid & R neck hematoma, s/p C3-C7 ACDF, ETT in good position, lung apices with acute pulmonary edema  SIGNIFICANT EVENTS: 3/25  Admit for planned ACDF in setting of R sided hand / arm pain  3/26  Respiratory decompensation on floor, intubated per anesthesia  3/29 failed swallow -NG inserted 3/31- increased hypoxia, tachypnea.  ?HCAP v aspiration v atx.  Empiric abx started.  3/31 self extubated   SUBJECTIVE:  Afebrile Self extubated last evening Weak cough, appears to have lots of secretions, on NRB  VITAL SIGNS: Temp:  [97.6 F (36.4 C)-98.7 F (37.1 C)] 97.6 F (36.4 C) (04/01 0906) Pulse Rate:  [76-98] 86 (04/01 0900) Resp:  [13-31] 30 (04/01 0900) BP: (84-182)/(42-82) 139/66 mmHg (04/01 0900) SpO2:  [86 %-100 %] 92 % (04/01 0900) FiO2 (%):  [80 %-100 %] 100 % (04/01 0700) Weight:  [79.7 kg (175 lb 11.3 oz)-81.4 kg (179 lb 7.3 oz)] 79.7 kg (175 lb 11.3 oz) (04/01 0315)   VENTILATOR SETTINGS: Vent Mode:  [-] PRVC FiO2 (%):  [80 %-100 %] 100 % Set Rate:  [18 bmp] 18 bmp Vt Set:  [600 mL] 600 mL PEEP:  [5 cmH20] 5 cmH20   INTAKE / OUTPUT:  Intake/Output Summary (Last 24 hours) at 09/30/14 1135 Last data filed at 09/30/14 0900  Gross per 24 hour  Intake 1602.41 ml  Output   1290 ml  Net 312.41 ml   PHYSICAL EXAMINATION: General:  wdwn adult male in mild distres Neuro: RASS 0 C-Collar in  place, follows commands, gen weakness  HEENT:   mm pink/moist, anterior bruising, no notable swelling  Cardiovascular:  s1s2 rrr, no m/r/g Lungs:  resp's even/non-labored, tachypneic, RR 35-45, lungs bilateral crackles Abdomen:  Flat, soft, bsx4 active  Musculoskeletal:  No acute deformities  Skin:  Warm/dry, no edema   LABS:  CBC  Recent Labs Lab 09/28/14 0254 09/29/14 0330 09/30/14 0530  WBC 11.5* 16.8* 19.5*  HGB 13.0 12.7* 12.5*  HCT 38.6* 36.4* 37.7*  PLT 335 370 375   BMET  Recent Labs Lab 09/28/14 0254 09/29/14 0330 09/30/14 0530  NA 140 141 146*  K 3.4* 4.2 3.9  CL 108 107 110  CO2 BUN 27* 36* 39*  CREATININE 0.69 0.81 0.90  GLUCOSE 142* 179* 123*   Electrolytes  Recent Labs Lab 09/28/14 0254 09/29/14 0330 09/30/14 0530  CALCIUM 8.8 9.3 9.2   ABG  Recent Labs Lab 09/26/14 1140 09/29/14 1102 09/29/14 1538  PHART 7.422 7.488* 7.459*  PCO2ART 35.9 34.7* 34.7*  PO2ART 57.0* 135.0* 270.0*   Glucose  Recent Labs Lab 09/29/14 1159 09/29/14 1617 09/29/14 2019 09/29/14 2359 09/30/14 0506 09/30/14 0826  GLUCAP 134* 161* 128* 149* 107* 99   Imaging Dg Chest Port 1 View  09/29/2014   CLINICAL DATA:  Acute respiratory failure.  Intubation.  EXAM: PORTABLE CHEST - 1 VIEW  COMPARISON:  09/29/2014  FINDINGS: A new endotracheal tube is seen just below the level of the clavicles with the tip approximately 5.5 cm above the carina. Previously seen feeding tube is been removed. Cervical spine fusion hardware again noted.  Decreased atelectasis noted in the right lung base. Persistent subsegmental atelectasis seen in the left lower lung, without significant change. No evidence of pulmonary consolidation or pleural effusion. No evidence of pneumothorax. Heart size remains stable.  IMPRESSION: Endotracheal tube tip approximately 5.5 cm above carina.  Stable left lower lung atelectasis. Decreased right basilar atelectasis.   Electronically Signed   By:  Myles Rosenthal M.D.   On: 09/29/2014 13:29   Dg Chest Port 1 View  09/29/2014   CLINICAL DATA:  Aspiration.  EXAM: PORTABLE CHEST - 1 VIEW  COMPARISON:  09/26/2014, 09/24/2014.  FINDINGS: Interim extubation. Interim placement of feeding tube. Its tip is at the gastroesophageal junction. More distal placement suggested. Heart size stable. Persistent bibasilar atelectasis and infiltrates. No pleural effusion or pneumothorax. Prior cervical spine fusion.  IMPRESSION: 1. Interim extubation. Interim placement of feeding tube. Its tip is at the gastroesophageal junction. More distal placement suggested. 2. Persistent bibasilar atelectasis and/or infiltrates. Critical Value/emergent results were called by telephone at the time of interpretation on 09/29/2014 at 7:54 am to nurse Celenia, who verbally acknowledged these results.   Electronically Signed   By: Maisie Fus  Register   On: 09/29/2014 07:57   Dg Abd Portable 1v  09/29/2014   CLINICAL DATA:  Feeding tube placement.  EXAM: PORTABLE ABDOMEN - 1 VIEW  COMPARISON:  Radiograph dated 09/27/2014  FINDINGS: Feeding tube tip is in the fundus of the stomach. Visualized bowel gas pattern is normal. Endotracheal tube is at the thoracic inlet. Visualized portions of the lungs are clear.  IMPRESSION: Feeding tube tip in the fundus of the stomach.   Electronically Signed   By: Francene Boyers M.D.   On: 09/29/2014 17:10   ASSESSMENT / PLAN:  PULMONARY OETT 3/26 >> 3/28, 3/31 >>3/31, 4/1 >> A: Acute Respiratory Failure - in setting of post-op discectomy due to R neck hematoma.  Recurrent 3/31 r/t?HCAP v atx R Neck Hematoma  Acute Pulmonary Edema -resolved ?HCAP  Atx, poor cough mechanics P:   Decadron per NSGY Pulm hygiene  Add empiric abx for ?HCAP v aspiration (displaced panda overnight, no obvious aspiration) Re-intubate & plan for trach   NEUROLOGIC A:   Cervical Spondylosis with Myelopathy s/p Discectomy at C3-C7 P:   Fentanyl / propofol gtt as Bp  permits Rec's per NSGY C-Collar  PT when resp status allows   CARDIOVASCULAR CVL None A:  HTN HLD P:  Hold home gemfibrozil, losartan Monitor hemodynamics  RENAL A:   hypernatremia P:   Monitor BMP Replace electrolytes as indicated    GASTROINTESTINAL A:   Vent Associated Dysphagia  P:   TF's  PPI  HEMATOLOGIC A:   R Neck Hematoma   P:  Monitor CBC SCD's for DVT   INFECTIOUS A:   ?HCAP v aspiration  resp cx 3/31 >>GNR >> P:   Vanc 3/31>>> Zosyn 3/31>>>  ENDOCRINE A:   At Risk Hypoglycemia    P:   Monitor CBG  FAMILY  - Updates: step daughter [name] 4/1   Summary - reintubate & plan for trach , co-ordinated with NS & ENT  The patient is critically ill with multiple organ systems failure and requires high complexity decision making for assessment and support, frequent evaluation and titration of therapies,  application of advanced monitoring technologies and extensive interpretation of multiple databases. Critical Care Time devoted to patient care services described in this note independent of APP time is 35 minutes.    Cyril Mourningakesh Nicanor Mendolia MD. Tonny BollmanFCCP.  Pulmonary & Critical care Pager [phone] If no response call [phone]    09/30/2014, 11:35 AM

## 2014-10-01 ENCOUNTER — Inpatient Hospital Stay (HOSPITAL_COMMUNITY): Payer: Medicare Other | Admitting: Anesthesiology

## 2014-10-01 ENCOUNTER — Encounter (HOSPITAL_COMMUNITY)
Admission: RE | Disposition: A | Discharge status: Nursing Home (Includes ICF) | Payer: Self-pay | Source: Ambulatory Visit | Attending: Neurosurgery

## 2014-10-01 HISTORY — PX: TRACHEOSTOMY TUBE PLACEMENT: SHX814

## 2014-10-01 HISTORY — PX: DIRECT LARYNGOSCOPY: SHX5326

## 2014-10-01 LAB — BASIC METABOLIC PANEL
ANION GAP: 4 — AB (ref 5–15)
BUN: 40 mg/dL — ABNORMAL HIGH (ref 6–23)
CALCIUM: 8.9 mg/dL (ref 8.4–10.5)
CHLORIDE: 112 mmol/L (ref 96–112)
CO2: 29 mmol/L (ref 19–32)
Creatinine, Ser: 0.94 mg/dL (ref 0.50–1.35)
GFR calc Af Amer: 90 mL/min — ABNORMAL LOW (ref >90)
GFR calc non Af Amer: 77 mL/min — ABNORMAL LOW (ref >90)
Glucose, Bld: 151 mg/dL — ABNORMAL HIGH (ref 70–99)
POTASSIUM: 3.9 mmol/L (ref 3.5–5.1)
SODIUM: 145 mmol/L (ref 135–145)

## 2014-10-01 LAB — CBC
HCT: 36.1 % — ABNORMAL LOW (ref 39.0–52.0)
HEMOGLOBIN: 11.9 g/dL — AB (ref 13.0–17.0)
MCH: 29.8 pg (ref 26.0–34.0)
MCHC: 33 g/dL (ref 30.0–36.0)
MCV: 90.5 fL (ref 78.0–100.0)
PLATELETS: 362 10*3/uL (ref 150–400)
RBC: 3.99 MIL/uL — AB (ref 4.22–5.81)
RDW: 13.9 % (ref 11.5–15.5)
WBC: 18.2 10*3/uL — ABNORMAL HIGH (ref 4.0–10.5)

## 2014-10-01 LAB — GLUCOSE, CAPILLARY
GLUCOSE-CAPILLARY: 140 mg/dL — AB (ref 70–99)
GLUCOSE-CAPILLARY: 103 mg/dL — AB (ref 70–99)
GLUCOSE-CAPILLARY: 142 mg/dL — AB (ref 70–99)
Glucose-Capillary: 177 mg/dL — ABNORMAL HIGH (ref 70–99)
Glucose-Capillary: 106 mg/dL — ABNORMAL HIGH (ref 70–99)
Glucose-Capillary: 130 mg/dL — ABNORMAL HIGH (ref 70–99)

## 2014-10-01 SURGERY — CREATION, TRACHEOSTOMY
Anesthesia: General

## 2014-10-01 MED ORDER — PROPOFOL 10 MG/ML IV BOLUS
INTRAVENOUS | Status: DC | PRN
  Administered 2014-10-01: 50 mg via INTRAVENOUS

## 2014-10-01 MED ORDER — SUCCINYLCHOLINE CHLORIDE 20 MG/ML IJ SOLN
INTRAMUSCULAR | Status: AC
  Filled 2014-10-01: qty 1

## 2014-10-01 MED ORDER — LIDOCAINE-EPINEPHRINE 1 %-1:100000 IJ SOLN
INTRAMUSCULAR | Status: DC | PRN
  Administered 2014-10-01: 8 mL

## 2014-10-01 MED ORDER — LIDOCAINE-EPINEPHRINE 1 %-1:100000 IJ SOLN
INTRAMUSCULAR | Status: AC
  Filled 2014-10-01: qty 1

## 2014-10-01 MED ORDER — 0.9 % SODIUM CHLORIDE (POUR BTL) OPTIME
TOPICAL | Status: DC | PRN
  Administered 2014-10-01: 1000 mL

## 2014-10-01 MED ORDER — PHENYLEPHRINE HCL 10 MG/ML IJ SOLN
10.0000 mg | INTRAVENOUS | Status: DC | PRN
  Administered 2014-10-01: 20 ug/min via INTRAVENOUS

## 2014-10-01 MED ORDER — PROPOFOL 10 MG/ML IV BOLUS
INTRAVENOUS | Status: AC
  Filled 2014-10-01: qty 20

## 2014-10-01 MED ORDER — PHENYLEPHRINE 40 MCG/ML (10ML) SYRINGE FOR IV PUSH (FOR BLOOD PRESSURE SUPPORT)
PREFILLED_SYRINGE | INTRAVENOUS | Status: AC
  Filled 2014-10-01: qty 10

## 2014-10-01 MED ORDER — ROCURONIUM BROMIDE 50 MG/5ML IV SOLN
INTRAVENOUS | Status: AC
  Filled 2014-10-01: qty 1

## 2014-10-01 MED ORDER — LIDOCAINE HCL (CARDIAC) 20 MG/ML IV SOLN
INTRAVENOUS | Status: AC
  Filled 2014-10-01: qty 5

## 2014-10-01 MED ORDER — ARTIFICIAL TEARS OP OINT
TOPICAL_OINTMENT | OPHTHALMIC | Status: AC
  Filled 2014-10-01: qty 3.5

## 2014-10-01 MED ORDER — LACTATED RINGERS IV SOLN
INTRAVENOUS | Status: DC | PRN
  Administered 2014-10-01: 09:00:00 via INTRAVENOUS

## 2014-10-01 MED ORDER — ARTIFICIAL TEARS OP OINT
TOPICAL_OINTMENT | OPHTHALMIC | Status: DC | PRN
  Administered 2014-10-01: 1 via OPHTHALMIC

## 2014-10-01 MED ORDER — ROCURONIUM BROMIDE 100 MG/10ML IV SOLN
INTRAVENOUS | Status: DC | PRN
  Administered 2014-10-01: 50 mg via INTRAVENOUS

## 2014-10-01 SURGICAL SUPPLY — 46 items
BALLN PULM 15 16.5 18 X 75CM (BALLOONS)
BALLN PULM 15 16.5 18X75 (BALLOONS)
BALLOON PULM 15 16.5 18X75 (BALLOONS) IMPLANT
BLADE SURG 15 STRL LF DISP TIS (BLADE) IMPLANT
BLADE SURG 15 STRL SS (BLADE)
BLADE SURG ROTATE 9660 (MISCELLANEOUS) IMPLANT
CANISTER SUCTION 2500CC (MISCELLANEOUS) ×3 IMPLANT
CLEANER TIP ELECTROSURG 2X2 (MISCELLANEOUS) ×3 IMPLANT
CONT SPEC 4OZ CLIKSEAL STRL BL (MISCELLANEOUS) IMPLANT
COVER MAYO STAND STRL (DRAPES) ×3 IMPLANT
COVER SURGICAL LIGHT HANDLE (MISCELLANEOUS) ×3 IMPLANT
COVER TABLE BACK 60X90 (DRAPES) ×3 IMPLANT
CRADLE DONUT ADULT HEAD (MISCELLANEOUS) IMPLANT
DECANTER SPIKE VIAL GLASS SM (MISCELLANEOUS) ×3 IMPLANT
DRAPE PROXIMA HALF (DRAPES) ×3 IMPLANT
ELECT COATED BLADE 2.86 ST (ELECTRODE) ×3 IMPLANT
ELECT REM PT RETURN 9FT ADLT (ELECTROSURGICAL) ×3
ELECTRODE REM PT RTRN 9FT ADLT (ELECTROSURGICAL) ×1 IMPLANT
GLOVE ECLIPSE 8.0 STRL XLNG CF (GLOVE) ×6 IMPLANT
GOWN BRE IMP SLV AUR LG STRL (GOWN DISPOSABLE) IMPLANT
GOWN STRL REUS W/ TWL LRG LVL3 (GOWN DISPOSABLE) ×1 IMPLANT
GOWN STRL REUS W/ TWL XL LVL3 (GOWN DISPOSABLE) ×1 IMPLANT
GOWN STRL REUS W/TWL LRG LVL3 (GOWN DISPOSABLE) ×3
GOWN STRL REUS W/TWL XL LVL3 (GOWN DISPOSABLE) ×3
GUARD TEETH (MISCELLANEOUS) ×3 IMPLANT
KIT BASIN OR (CUSTOM PROCEDURE TRAY) ×3 IMPLANT
KIT ROOM TURNOVER OR (KITS) ×3 IMPLANT
NDL HYPO 25GX1X1/2 BEV (NEEDLE) IMPLANT
NEEDLE HYPO 25GX1X1/2 BEV (NEEDLE) IMPLANT
NS IRRIG 1000ML POUR BTL (IV SOLUTION) ×3 IMPLANT
PAD ARMBOARD 7.5X6 YLW CONV (MISCELLANEOUS) ×6 IMPLANT
PATTIES SURGICAL .5 X3 (DISPOSABLE) IMPLANT
PENCIL BUTTON HOLSTER BLD 10FT (ELECTRODE) ×3 IMPLANT
SOLUTION ANTI FOG 6CC (MISCELLANEOUS) IMPLANT
SPONGE DRAIN TRACH 4X4 STRL 2S (GAUZE/BANDAGES/DRESSINGS) ×3 IMPLANT
SURGILUBE 2OZ TUBE FLIPTOP (MISCELLANEOUS) IMPLANT
SUT CHROMIC 2 0 SH (SUTURE) ×3 IMPLANT
SUT ETHILON 2 0 FS 18 (SUTURE) IMPLANT
SUT SILK 2 0 SH CR/8 (SUTURE) ×3 IMPLANT
SYR INFLATE BILIARY GAUGE (MISCELLANEOUS) IMPLANT
TOWEL OR 17X24 6PK STRL BLUE (TOWEL DISPOSABLE) ×3 IMPLANT
TRAY ENT MC OR (CUSTOM PROCEDURE TRAY) ×3 IMPLANT
TUBE CONNECTING 12'X1/4 (SUCTIONS) ×1
TUBE CONNECTING 12X1/4 (SUCTIONS) ×2 IMPLANT
TUBE TRACH SHILEY  6 DIST  CUF (TUBING) ×2 IMPLANT
WATER STERILE IRR 1000ML POUR (IV SOLUTION) ×3 IMPLANT

## 2014-10-01 NOTE — Op Note (Signed)
10/01/2014 9:56 AM  Lucrezia EuropeNelson,  [place] [id number]  Pre-Op Dx: respiratory distress  Post-Op Dx:  same  Proc:  Tracheostomy, direct laryngoscopy  Surg:  Flo ShanksWOLICKI, Terrin Imparato  Anes:  GOT to Gen'l tracheostomal  EBL:  min  Comp:  none  Findings:  Relatively thin neck with some tissue edema.  Small thyroid isthmus.  Mobile vocal cords bilat. With mild endolaryngeal edema.  Procedure:  The patient was brought from the intensive care unit to the operating room and transferred to an operating table.  Anesthesia was administered per indwelling orotracheal tube.  The patient was placed in a slight reverse Trendelenburg.  Neck extension was achieved as possible.  The lower neck was palpated with the findings as described above.  1% Xylocaine with 1:100,000 epinephrine, 8 cc's, was infiltrated into the surgical field for intraoperative hemostasis.  Several minutes were allowed for this to take effect.  A Hibiclens sterile preparation  of the lower neck and upper chest was performed in the standard fashion.  Sterile draping was accomplished in the standard fashion.  A  3 cm transverse incision was made sharply approximately halfway between the sternal notch and cricoid cartilage and extended through skin and subcutaneous fat.  Using cautery, the superficial layer of the deep cervical fascia was lysed. Small anterior jugular veins were ligated on each side.  Additional dissection revealed the strap muscles.  The midline raphe was divided in two layers and the muscles retracted laterally.  The pretracheal plane was visualized.  This was entered bluntly.  The thyroid isthmus was isolated between hemostats, divided, and controlled with 2-0 silk suture ligatures.  The thyroid gland was retracted to either side.  The anterior face of the trachea was cleared.  In the  2-3 interspace, a transverse incision was made between cartilage rings into the tracheal lumen. The opening was dilated with a trach spreader.   Mucosal  edges were cauterized for hemostasis.  A previously tested  # 6 Shiley cuffed tracheostomy tube was brought into the field.  With the endotracheal tube under direct visualization through the tracheostomy, it was gently backed up.  The tracheostomy tube was inserted into the tracheal lumen.  Hemostasis was observed. The cuff was inflated and observed to be intact and containing pressure. The inner cannula was placed and ventilation assumed per tracheostomy tube.  Good chest wall motion was observed, and CO2 was documented per anesthesia.  The trach tube was secured in the standard fashion with cotton twill ties.  Hemostasis was observed again.  When satisfactory ventilation was assured, the orotracheal tube was removed. A standard trach dressing was placed.   A rubber tooth guard was placed and the anterior commissure laryngoscope was introduced and passed into the supra-glottis.  The vocal cords were observed to be mobile.  The scope and tooth guard were removed.   At this point the procedure was completed.  The patient was returned to anesthesia, awakened as possible, and transferred back to the intensive care unit in stable condition.  Comment: 81 y.o. wm with airway edema and poor protection was the indication for today's procedure.  Anticipate a routine postoperative recovery including standard tracheal hygiene.  We will change the trach ties at four days but not use Velcro ties until seven days.  When the patient no longer requires ventilator or pressure support, the cuff should be deflated.  Changeover to an uncuffed tube and downsizing will be according to the clinical condition of the patient.

## 2014-10-01 NOTE — Progress Notes (Signed)
Pt came back from OR with vent settings of PRVC 600/18/+10/60%. Pt originally on Vt of which is pt's 8cc's. Pt breath stacking on . Pt placed back on per original order. Pt tolerating well at this time. RT will continue to monitor.

## 2014-10-01 NOTE — Progress Notes (Signed)
PULMONARY / CRITICAL CARE MEDICINE   Name: Ricardo Rivera MRN: [id number] DOB: 07/06/1932    ADMISSION DATE:  09/23/2014 CONSULTATION DATE:  09/24/14  REFERRING MD :  Dr. Conchita ParisNundkumar   CHIEF COMPLAINT:  VDRF   INITIAL PRESENTATION: 81 y/o M with a PMH of R sided hand / arm pain admitted on 3/25 for planned ACDF.  Early am 3/26, the patient developed agitation, hypoxia and respiratory distress.  He was emergently intubated per anesthesia (easy intubation but swollen).  PCCM consulted for ICU vent management.   STUDIES:  3/26  CT Neck >> post op prevertebral / retropharyngeal fluid & R neck hematoma, s/p C3-C7 ACDF, ETT in good position, lung apices with acute pulmonary edema  SIGNIFICANT EVENTS: 3/25  Admit for planned ACDF in setting of R sided hand / arm pain  3/26  Respiratory decompensation on floor, intubated per anesthesia  3/29 failed swallow -NG inserted 3/31- increased hypoxia, tachypnea.  ?HCAP v aspiration v atx.  Empiric abx started.  3/31 self extubated 4/1 reintubated, unable to clear secretions   SUBJECTIVE:  Afebrile sedated  VITAL SIGNS: Temp:  [97.3 F (36.3 C)-98.1 F (36.7 C)] 98.1 F (36.7 C) (04/02 0834) Pulse Rate:  [63-96] 79 (04/02 0830) Resp:  [13-35] 18 (04/02 0830) BP: (68-157)/(41-97) 135/62 mmHg (04/02 0800) SpO2:  [91 %-100 %] 100 % (04/02 0830) FiO2 (%):  [60 %-100 %] 60 % (04/02 0400) Weight:  [78.4 kg (172 lb 13.5 oz)] 78.4 kg (172 lb 13.5 oz) (04/02 0130)   VENTILATOR SETTINGS: Vent Mode:  [-] PRVC FiO2 (%):  [60 %-100 %] 60 % Set Rate:  [18 bmp] 18 bmp Vt Set:  [600 mL] 600 mL PEEP:  [10 cmH20] 10 cmH20 Plateau Pressure:  [17 cmH20-30 cmH20] 20 cmH20   INTAKE / OUTPUT:  Intake/Output Summary (Last 24 hours) at 10/01/14 0857 Last data filed at 10/01/14 0700  Gross per 24 hour  Intake 2120.76 ml  Output    850 ml  Net 1270.76 ml   PHYSICAL EXAMINATION: General:  wdwn adult male in mild distres Neuro: RASS 0 C-Collar in place,  follows commands, gen weakness  HEENT:   mm pink/moist, anterior bruising, no notable swelling  Cardiovascular:  s1s2 rrr, no m/r/g Lungs:  resp's even/non-labored,  lungs bilateral crackles Abdomen:  Flat, soft, bsx4 active  Musculoskeletal:  No acute deformities  Skin:  Warm/dry, no edema   LABS:  CBC  Recent Labs Lab 09/29/14 0330 09/30/14 0530 10/01/14 0420  WBC 16.8* 19.5* 18.2*  HGB 12.7* 12.5* 11.9*  HCT 36.4* 37.7* 36.1*  PLT 370 375 362   BMET  Recent Labs Lab 09/29/14 0330 09/30/14 0530 10/01/14 0420  NA 141 146* 145  K 4.2 3.9 3.9  CL 107 110 112  CO2 22 28 29   BUN 36* 39* 40*  CREATININE 0.81 0.90 0.94  GLUCOSE 179* 123* 151*   Electrolytes  Recent Labs Lab 09/29/14 0330 09/30/14 0530 10/01/14 0420  CALCIUM 9.3 9.2 8.9   ABG  Recent Labs Lab 09/26/14 1140 09/29/14 1102 09/29/14 1538  PHART 7.422 7.488* 7.459*  PCO2ART 35.9 34.7* 34.7*  PO2ART 57.0* 135.0* 270.0*   Glucose  Recent Labs Lab 09/30/14 0826 09/30/14 1234 09/30/14 1624 09/30/14 1944 10/01/14 0033 10/01/14 0414  GLUCAP 99 109* 126* 152* 177* 140*   Imaging Dg Chest Port 1 View  09/30/2014   CLINICAL DATA:  Hypoxia with re-intubation  EXAM: PORTABLE CHEST - 1 VIEW  COMPARISON:  Study obtained earlier in  the day  FINDINGS: Endotracheal tube tip is 7.1 cm above the carina. Feeding tube tip is in the proximal stomach. No pneumothorax. There is consolidation in the medial right base. Lungs otherwise are clear. Heart size and pulmonary vascularity are normal. No adenopathy. There is postoperative change in the lower cervical spine.  IMPRESSION: Tube positions as described without pneumothorax. Consolidation medial right base. Lungs otherwise clear.   Electronically Signed   By: Bretta Bang III M.D.   On: 09/30/2014 11:28   Dg Chest Portable 1 View  09/30/2014   CLINICAL DATA:  Respiratory failure.  Hypertension.  EXAM: PORTABLE CHEST - 1 VIEW  COMPARISON:  09/29/2014   FINDINGS: Endotracheal tube has been removed. Placement of feeding tube and the tip is in the stomach fundus region. There are new or increased patchy densities at the left lung base. Few densities at the right lung base may represent atelectasis. Upper lungs are clear. Heart size is normal. Again noted is a surgical plate and hardware in the lower cervical spine.  IMPRESSION: Increased densities at the left lung base are concerning for developing airspace disease or aspiration. Recommend follow-up to evaluate this area.  Placement of feeding tube with the tip in the stomach. Removal of endotracheal tube.   Electronically Signed   By: Richarda Overlie M.D.   On: 09/30/2014 07:46   ASSESSMENT / PLAN:  PULMONARY OETT 3/26 >> 3/28, 3/31 >>3/31, 4/1 >> A: Acute Respiratory Failure - in setting of post-op discectomy due to R neck hematoma.  Recurrent 3/31 r/t?HCAP v atx R Neck Hematoma  Acute Pulmonary Edema -resolved ?HCAP -Atx, poor cough mechanics P:   Decadron per NSGY Pulm hygiene  Add empiric abx for ?HCAP v aspiration (displaced panda overnight, no obvious aspiration) Re-intubated & plan for trach today   NEUROLOGIC A:   Cervical Spondylosis with Myelopathy s/p Discectomy at C3-C7 P:   Fentanyl / propofol gtt as Bp permits Rec's per NSGY C-Collar  PT when resp status allows   CARDIOVASCULAR CVL None A:  HTN HLD P:  Hold home gemfibrozil, losartan Monitor hemodynamics  RENAL A:   hypernatremia P:   Monitor BMP Replace electrolytes as indicated    GASTROINTESTINAL A:   Vent Associated Dysphagia  P:   TF's  PPI  HEMATOLOGIC A:   R Neck Hematoma   P:  Monitor CBC SCD's for DVT   INFECTIOUS A:   ?HCAP v aspiration  resp cx 3/31 >>GNR >> P:   Vanc 3/31>>> Zosyn 3/31>>>  ENDOCRINE A:   At Risk Hypoglycemia    P:   Monitor CBG  FAMILY  - Updates: step daughter [name] 4/1   Summary - reintubated & plan for trach , co-ordinated with NS & ENT Resume TFs  after trach  The patient is critically ill with multiple organ systems failure and requires high complexity decision making for assessment and support, frequent evaluation and titration of therapies, application of advanced monitoring technologies and extensive interpretation of multiple databases. Critical Care Time devoted to patient care services described in this note independent of APP time is 35 minutes.    Cyril Mourning MD. Tonny Bollman. [facility] Pager [phone] If no response call [phone]    10/01/2014, 8:57 AM

## 2014-10-01 NOTE — Progress Notes (Signed)
Pt in OR. AM vent check not done. RT will continue to monitor.

## 2014-10-01 NOTE — Anesthesia Postprocedure Evaluation (Signed)
  Anesthesia Post-op Note  Patient: Ricardo Rivera  Procedure(s) Performed: Procedure(s): TRACHEOSTOMY (N/A) DIRECT LARYNGOSCOPY (N/A)  Patient Location: ICU  Anesthesia Type:General  Level of Consciousness: sedated  Airway and Oxygen Therapy: Patient remains intubated per anesthesia plan  Post-op Pain: none  Post-op Assessment: Post-op Vital signs reviewed, Patient's Cardiovascular Status Stable and Respiratory Function Stable  Post-op Vital Signs: Reviewed and stable  Last Vitals:  Filed Vitals:   10/01/14 0834  BP:   Pulse:   Temp: 36.7 C  Resp:     Complications: No apparent anesthesia complications

## 2014-10-01 NOTE — Progress Notes (Signed)
PT Cancellation Note  Patient Details Name: Ricardo Rivera L Supak MRN: [id number] DOB: 10/02/1932   Cancelled Treatment:    Reason Eval/Treat Not Completed: Patient not medically ready Spoke with RN who reports patient had trach placed in OR today and remains sedated. Therapy has been held 3 consecutive visits and per departmental protocol will need to sign-off at this time. Please re-order when you feel patient is medically ready and we will re-evaluate patient and continue therapy services. Thank you!  [place], Keary Waterson S 10/01/2014, 4:14 PM Sunday SpillersLogan Secor [place], [place] [phone]

## 2014-10-01 NOTE — Transfer of Care (Signed)
Immediate Anesthesia Transfer of Care Note  Patient: Ricardo Rivera  Procedure(s) Performed: Procedure(s): TRACHEOSTOMY (N/A) DIRECT LARYNGOSCOPY (N/A)  Patient Location: PACU  Anesthesia Type:General  Level of Consciousness: sedated and Patient remains intubated per anesthesia plan  Airway & Oxygen Therapy: Patient remains intubated per anesthesia plan and Patient placed on Ventilator (see vital sign flow sheet for setting)  Post-op Assessment: Report given to RN  Post vital signs: Reviewed and stable  Last Vitals:  Filed Vitals:   10/01/14 0834  BP:   Pulse:   Temp: 36.7 C  Resp:     Complications: No apparent anesthesia complications

## 2014-10-01 NOTE — Progress Notes (Addendum)
NUTRITION FOLLOW UP  Intervention:   New TF recommendations: TF via NGT with Vital HP at 25 ml/h and Prostat 30 ml BID on day 1; on day 2 d/c prostat, increase to goal rate of 60 ml/h (1560 ml per day) to provide 1440 kcals (+185 from propofol), 126 gm protein, 1204 ml free water daily.  Nutrition Dx:   Inadequate oral intake related to inability to eat as evidenced by NPO status; ongoing  Goal:   Pt will meet >90% of estimated nutritional needs; unmet  Monitor:   TF initiation/advancement/tolerance, Propofol rate, labs, weight changes, I/O's  Assessment:   81 y/o M with a PMH of R sided hand / arm pain admitted on 3/25 for planned ACDF. Early am 3/26, the patient developed agitation, hypoxia and respiratory distress. He was emergently intubated per anesthesia (easy intubation but swollen). PCCM consulted for ICU vent management.   SIGNIFICANT EVENTS: 3/25 Admit for planned ACDF in setting of R sided hand / arm pain  3/26 Respiratory decompensation on floor, intubated per anesthesia  3/29 failed swallow -NG inserted 3/31- increased hypoxia, tachypnea. ?HCAP v aspiration v atx. Empiric abx started.  3/31 self extubated 4/1: Re intubated 4/2: Trach to be placed by ENT  Patient is currently intubated on ventilator support MV: 10 L/min Temp (24hrs), Avg:97.6 F (36.4 C), Min:97.3 F (36.3 C), Max:97.8 F (36.6 C)  Propofol: 7 ml/hr = 185 kcals daily   Height: Ht Readings from Last 1 Encounters:  09/29/14 5\' 11"  (1.803 m)    Weight Status:   Wt Readings from Last 1 Encounters:  10/01/14 172 lb 13.5 oz (78.4 kg)   Body mass index is 24.12 kg/(m^2).  Re-estimated needs:  Kcal: 1675 kcals Protein: 118-133 grams (1.5-1.7 g/kg) Fluid: per md  Skin: closed rt neck incision  Diet Order: Diet NPO time specified   Intake/Output Summary (Last 24 hours) at 10/01/14 0805 Last data filed at 10/01/14 0700  Gross per 24 hour  Intake 2120.76 ml  Output    850 ml   Net 1270.76 ml    Last BM: 4/1   Labs:   Recent Labs Lab 09/29/14 0330 09/30/14 0530 10/01/14 0420  NA 141 146* 145  K 4.2 3.9 3.9  CL 107 110 112  CO2 22 28 29   BUN 36* 39* 40*  CREATININE 0.81 0.90 0.94  CALCIUM 9.3 9.2 8.9  GLUCOSE 179* 123* 151*    CBG (last 3)   Recent Labs  09/30/14 1944 10/01/14 0033 10/01/14 0414  GLUCAP 152* 177* 140*    Scheduled Meds: . antiseptic oral rinse  7 mL Mouth Rinse q12n4p  . brimonidine  1 drop Both Eyes TID  . chlorhexidine  15 mL Mouth Rinse BID  . cholecalciferol  2,000 Units Oral Daily  . dexamethasone  4 mg Intravenous QID  . heparin subcutaneous  5,000 Units Subcutaneous 3 times per day  . latanoprost  1 drop Both Eyes QHS  . multivitamin  5 mL Per Tube Daily  . oxybutynin  5 mg Oral Daily  . pantoprazole sodium  40 mg Per Tube Daily  . piperacillin-tazobactam (ZOSYN)  IV  3.375 g Intravenous 3 times per day  . vancomycin  1,000 mg Intravenous Q12H    Continuous Infusions: . sodium chloride 50 mL/hr at 10/01/14 0047  . feeding supplement (VITAL AF 1.2 CAL) Stopped (09/30/14 2347)  . fentaNYL infusion INTRAVENOUS 75 mcg/hr (09/30/14 2256)  . norepinephrine (LEVOPHED) Adult infusion Stopped (10/01/14 [id number])  . propofol 15  mcg/kg/min (10/01/14 0128)   Christophe Louis RD, LDN Nutrition Pager: [phone] 10/01/2014 8:05 AM

## 2014-10-01 NOTE — Progress Notes (Signed)
Patient ID: Ricardo Rivera, male   DOB: 12/12/1932, 81 y.o.   MRN: [id number] For tracheostomy today by ent

## 2014-10-01 NOTE — Progress Notes (Signed)
Transported pt to OR on vent with OR team without complications.

## 2014-10-01 NOTE — Anesthesia Preprocedure Evaluation (Addendum)
Anesthesia Evaluation  Patient identified by MRN, date of birth, ID band Patient unresponsive    Reviewed: Allergy & Precautions, NPO status , Patient's Chart, lab work & pertinent test results, Unable to perform ROS - Chart review only  History of Anesthesia Complications (+) DIFFICULT AIRWAY  Airway       Comment: Unable to evaluate. Pt is intubated already. Dental   Pulmonary shortness of breath, pneumonia -, unresolved, Current Smoker,  Patient requires mechanical  ventilation due to aspiration pneumonia and respiratory failure. FiO2  60% Vt 600 R 10     + intubated    Cardiovascular hypertension, Pt. on medications Rhythm:Regular Rate:Normal     Neuro/Psych S/P ACDF x 3 levels    GI/Hepatic negative GI ROS, Neg liver ROS,   Endo/Other  negative endocrine ROS  Renal/GU negative Renal ROS     Musculoskeletal  (+) Arthritis -,   Abdominal   Peds  Hematology negative hematology ROS (+)   Anesthesia Other Findings   Reproductive/Obstetrics                            Anesthesia Physical Anesthesia Plan  ASA: IV  Anesthesia Plan: General   Post-op Pain Management:    Induction: Intravenous  Airway Management Planned: Oral ETT and Tracheostomy  Additional Equipment:   Intra-op Plan:   Post-operative Plan: Post-operative intubation/ventilation  Informed Consent: I have reviewed the patients History and Physical, chart, labs and discussed the procedure including the risks, benefits and alternatives for the proposed anesthesia with the patient or authorized representative who has indicated his/her understanding and acceptance.     Plan Discussed with: CRNA, Anesthesiologist and Surgeon  Anesthesia Plan Comments:         Anesthesia Quick Evaluation

## 2014-10-02 ENCOUNTER — Inpatient Hospital Stay (HOSPITAL_COMMUNITY): Payer: Medicare Other

## 2014-10-02 DIAGNOSIS — Z930 Tracheostomy status: Secondary | ICD-10-CM

## 2014-10-02 LAB — CULTURE, RESPIRATORY W GRAM STAIN

## 2014-10-02 LAB — GLUCOSE, CAPILLARY
GLUCOSE-CAPILLARY: 105 mg/dL — AB (ref 70–99)
GLUCOSE-CAPILLARY: 118 mg/dL — AB (ref 70–99)
GLUCOSE-CAPILLARY: 154 mg/dL — AB (ref 70–99)
Glucose-Capillary: 113 mg/dL — ABNORMAL HIGH (ref 70–99)
Glucose-Capillary: 174 mg/dL — ABNORMAL HIGH (ref 70–99)
Glucose-Capillary: 156 mg/dL — ABNORMAL HIGH (ref 70–99)

## 2014-10-02 LAB — BASIC METABOLIC PANEL
ANION GAP: 10 (ref 5–15)
BUN: 32 mg/dL — ABNORMAL HIGH (ref 6–23)
CHLORIDE: 108 mmol/L (ref 96–112)
CO2: 27 mmol/L (ref 19–32)
Calcium: 8.8 mg/dL (ref 8.4–10.5)
Creatinine, Ser: 0.81 mg/dL (ref 0.50–1.35)
GFR, EST NON AFRICAN AMERICAN: 82 mL/min — AB (ref >90)
Glucose, Bld: 141 mg/dL — ABNORMAL HIGH (ref 70–99)
POTASSIUM: 3.9 mmol/L (ref 3.5–5.1)
Sodium: 145 mmol/L (ref 135–145)

## 2014-10-02 LAB — CBC
HCT: 34 % — ABNORMAL LOW (ref 39.0–52.0)
HEMOGLOBIN: 11.1 g/dL — AB (ref 13.0–17.0)
MCH: 29.8 pg (ref 26.0–34.0)
MCHC: 32.6 g/dL (ref 30.0–36.0)
MCV: 91.2 fL (ref 78.0–100.0)
Platelets: 320 10*3/uL (ref 150–400)
RBC: 3.73 MIL/uL — AB (ref 4.22–5.81)
RDW: 13.9 % (ref 11.5–15.5)
WBC: 13.3 10*3/uL — ABNORMAL HIGH (ref 4.0–10.5)

## 2014-10-02 LAB — CULTURE, RESPIRATORY

## 2014-10-02 MED ORDER — ACETAMINOPHEN 160 MG/5ML PO SOLN
500.0000 mg | Freq: Four times a day (QID) | ORAL | Status: DC | PRN
  Administered 2014-10-02 – 2014-10-05 (×3): 500 mg
  Filled 2014-10-02 (×4): qty 20.3

## 2014-10-02 MED ORDER — DEXAMETHASONE SODIUM PHOSPHATE 4 MG/ML IJ SOLN
4.0000 mg | Freq: Two times a day (BID) | INTRAMUSCULAR | Status: DC
  Administered 2014-10-02 – 2014-10-04 (×5): 4 mg via INTRAVENOUS
  Filled 2014-10-02 (×6): qty 1

## 2014-10-02 NOTE — Progress Notes (Signed)
UR Completed.  [phone]  

## 2014-10-02 NOTE — Progress Notes (Signed)
Patient ID: Ricardo Rivera, male   DOB: 09/10/1932, 81 y.o.   MRN: [id number] S/p tracheostomy. Continue as per CCM

## 2014-10-02 NOTE — Consult Note (Signed)
ANTIBIOTIC CONSULT NOTE - Follow Up  Pharmacy Consult for Zosyn (Vanc now discontinued) Indication: HCAP  Allergies  Allergen Reactions  . Flexeril [Cyclobenzaprine] Other (See Comments)    Hallucinations  . Tramadol Other (See Comments)    Hallunications   Patient Measurements: Height: 5\' 11"  (180.3 cm) Weight: 177 lb 14.6 oz (80.7 kg) IBW/kg (Calculated) : 75.3  Vital Signs: Temp: 101.1 F (38.4 C) (04/03 0800) Temp Source: Oral (04/03 0800) BP: 186/81 mmHg (04/03 1031) Pulse Rate: 105 (04/03 1031) Intake/Output from previous day: 04/02 0701 - 04/03 0700 In: 1603 [I.V.:825.5; NG/GT:490; IV Piggyback:287.5] Out: 2025 [Urine:2025] Intake/Output from this shift: Total I/O In: 147.6 [I.V.:17.6; NG/GT:130] Out: 300 [Urine:300]  Labs:  Recent Labs  09/30/14 0530 10/01/14 0420 10/02/14 0420  WBC 19.5* 18.2* 13.3*  HGB 12.5* 11.9* 11.1*  PLT 375 362 320  CREATININE 0.90 0.94 0.81   Estimated Creatinine Clearance: 78.8 mL/min (by C-G formula based on Cr of 0.81).  Medical History: Past Medical History  Diagnosis Date  . Hypertension   . Shortness of breath dyspnea   . Arthritis   . Hyperlipemia   . Frequent urination   . Nocturia    Assessment: 81yom s/p spinal surgery who developed some hypoxia requiring intubation.  He has tolerated both Vancomycin and Zosyn without noted complications.  His WBC's continue to trend doward. Still with moderate thick secretions noted from trach.  He continues to have good UOP, a good creatinine, and his CXR shows hyperinflation without acute disease. He continues to have some persistent fevers to 101.1.  He is hypertensive this morning and slightly tachycardic.  His IV Vancomycin was discontinued today and he remains on IV Zosyn.   Goal of Therapy:  Therapeutic response to IV antibiotics  Plan:  - Continue IV Zosyn 3.375g IV q8 (4 hour infusion) - Follow renal function, cultures, LOT, level if needed  Nadara MustardNita Trusten Hume, PharmD.,  MS Clinical Pharmacist Pager:  [phone] Thank you for allowing pharmacy to be part of this patients care team. 10/02/2014,11:50 AM

## 2014-10-02 NOTE — Progress Notes (Signed)
PULMONARY / CRITICAL CARE MEDICINE   Name: Ricardo Rivera MRN: [id number] DOB: December 11, 1932    ADMISSION DATE:  09/23/2014 CONSULTATION DATE:  09/24/14  REFERRING MD :  Dr. Conchita Paris   CHIEF COMPLAINT:  VDRF   INITIAL PRESENTATION: 81 y/o M with a PMH of R sided hand / arm pain admitted on 3/25 for planned ACDF.  Early am 3/26, the patient developed agitation, hypoxia and respiratory distress.  He was emergently intubated per anesthesia (easy intubation but swollen).  PCCM consulted for ICU vent management.   STUDIES:  3/26  CT Neck >> post op prevertebral / retropharyngeal fluid & R neck hematoma, s/p C3-C7 ACDF, ETT in good position, lung apices with acute pulmonary edema  SIGNIFICANT EVENTS: 3/25  Admit for planned ACDF in setting of R sided hand / arm pain  3/26  Respiratory decompensation on floor, intubated per anesthesia  3/29 failed swallow -NG inserted 3/31- increased hypoxia, tachypnea.  ?HCAP v aspiration v atx.  Empiric abx started.  3/31 self extubated 4/1 reintubated, unable to clear secretions   SUBJECTIVE:  febrile Sedated on propofol /fent gtt  VITAL SIGNS: Temp:  [97.6 F (36.4 C)-101.1 F (38.4 C)] 101.1 F (38.4 C) (04/03 0800) Pulse Rate:  [65-108] 85 (04/03 0920) Resp:  [0-36] 23 (04/03 0920) BP: (89-178)/(47-145) 138/60 mmHg (04/03 0900) SpO2:  [88 %-100 %] 100 % (04/03 0920) FiO2 (%):  [50 %-60 %] 50 % (04/03 0910) Weight:  [80.7 kg (177 lb 14.6 oz)] 80.7 kg (177 lb 14.6 oz) (04/03 0418)   VENTILATOR SETTINGS: Vent Mode:  [-] PRVC FiO2 (%):  [50 %-60 %] 50 % Set Rate:  [18 bmp] 18 bmp Vt Set:  [510 mL-600 mL] 600 mL PEEP:  [10 cmH20] 10 cmH20 Plateau Pressure:  [17 cmH20-23 cmH20] 22 cmH20   INTAKE / OUTPUT:  Intake/Output Summary (Last 24 hours) at 10/02/14 0948 Last data filed at 10/02/14 0900  Gross per 24 hour  Intake 1150.63 ml  Output   2325 ml  Net -1174.37 ml   PHYSICAL EXAMINATION: General:  wdwn adult male in mild  distres Neuro: RASS 0 C-Collar in place, follows commands, gen weakness  HEENT:   mm pink/moist, anterior bruising, trach site clean Cardiovascular:  s1s2 rrr, no m/r/g Lungs:  resp's even/non-labored,  lungs bilateral crackles Abdomen:  Flat, soft, bsx4 active  Musculoskeletal:  No acute deformities  Skin:  Warm/dry, no edema   LABS:  CBC  Recent Labs Lab 09/30/14 0530 10/01/14 0420 10/02/14 0420  WBC 19.5* 18.2* 13.3*  HGB 12.5* 11.9* 11.1*  HCT 37.7* 36.1* 34.0*  PLT 375 362 320   BMET  Recent Labs Lab 09/30/14 0530 10/01/14 0420 10/02/14 0420  NA 146* 145 145  K 3.9 3.9 3.9  CL 110 112 108  CO2 BUN 39* 40* 32*  CREATININE 0.90 0.94 0.81  GLUCOSE 123* 151* 141*   Electrolytes  Recent Labs Lab 09/30/14 0530 10/01/14 0420 10/02/14 0420  CALCIUM 9.2 8.9 8.8   ABG  Recent Labs Lab 09/26/14 1140 09/29/14 1102 09/29/14 1538  PHART 7.422 7.488* 7.459*  PCO2ART 35.9 34.7* 34.7*  PO2ART 57.0* 135.0* 270.0*   Glucose  Recent Labs Lab 10/01/14 1206 10/01/14 1633 10/01/14 2019 10/01/14 2359 10/02/14 0402 10/02/14 0758  GLUCAP 103* 130* 142* 154* 118* 105*   Imaging No results found. ASSESSMENT / PLAN:  PULMONARY OETT 3/26 >> 3/28, 3/31 >>3/31, 4/1 >>4/2 4/2 trach >> A: Acute Respiratory Failure - in setting  of post-op discectomy due to R neck hematoma.  Recurrent 3/31 r/t?HCAP v atx R Neck Hematoma  Acute Pulmonary Edema -resolved ?HCAP -Atx, poor cough mechanics P:   Decadron per NSGY Pulm hygiene  SBTs with goal ATC    NEUROLOGIC A:   Cervical Spondylosis with Myelopathy s/p Discectomy at C3-C7 P:   Fentanyl  gtt as Bp permits - dc propofol Rec's per NSGY C-Collar  PT when resp status allows   CARDIOVASCULAR CVL None A:  HTN HLD P:  Hold home gemfibrozil, losartan Monitor hemodynamics  RENAL A:   hypernatremia P:   Monitor BMP Replace electrolytes as indicated    GASTROINTESTINAL A:   Vent  Associated Dysphagia  P:   TF's  PPI May need PEg based on swallow & progress with wean  HEMATOLOGIC A:   R Neck Hematoma   P:  Monitor CBC SCD's for DVT   INFECTIOUS A:   ?HCAP v aspiration  resp cx 3/31 >>GNR >> P:   Vanc 3/31>>> Zosyn 3/31>>>  ct empiric abx for ?HCAP v aspiration (displaced panda overnight, no obvious aspiration) ENDOCRINE A:   At Risk Hypoglycemia    P:   Monitor CBG  FAMILY  - Updates: step daughter French Anaracy 4/1   Summary - Hope to rapidly progress to ATC & then evaluate swallow & aggressive PT   The patient is critically ill with multiple organ systems failure and requires high complexity decision making for assessment and support, frequent evaluation and titration of therapies, application of advanced monitoring technologies and extensive interpretation of multiple databases. Critical Care Time devoted to patient care services described in this note independent of APP time is 32 minutes.    Cyril Mourningakesh Sheriff Rodenberg MD. Tonny BollmanFCCP. [facility] Pager [phone] If no response call [phone]    10/02/2014, 9:48 AM

## 2014-10-03 ENCOUNTER — Encounter (HOSPITAL_COMMUNITY): Payer: Self-pay | Admitting: Otolaryngology

## 2014-10-03 LAB — BASIC METABOLIC PANEL
Anion gap: 10 (ref 5–15)
BUN: 27 mg/dL — AB (ref 6–23)
CO2: 28 mmol/L (ref 19–32)
Calcium: 8.7 mg/dL (ref 8.4–10.5)
Chloride: 109 mmol/L (ref 96–112)
Creatinine, Ser: 0.87 mg/dL (ref 0.50–1.35)
GFR calc Af Amer: >90 mL/min (ref >90)
GFR, EST NON AFRICAN AMERICAN: 80 mL/min — AB (ref >90)
GLUCOSE: 178 mg/dL — AB (ref 70–99)
POTASSIUM: 3.7 mmol/L (ref 3.5–5.1)
Sodium: 147 mmol/L — ABNORMAL HIGH (ref 135–145)

## 2014-10-03 LAB — CBC
HCT: 32.9 % — ABNORMAL LOW (ref 39.0–52.0)
Hemoglobin: 10.6 g/dL — ABNORMAL LOW (ref 13.0–17.0)
MCH: 29.6 pg (ref 26.0–34.0)
MCHC: 32.2 g/dL (ref 30.0–36.0)
MCV: 91.9 fL (ref 78.0–100.0)
Platelets: 285 10*3/uL (ref 150–400)
RBC: 3.58 MIL/uL — ABNORMAL LOW (ref 4.22–5.81)
RDW: 13.8 % (ref 11.5–15.5)
WBC: 12.3 10*3/uL — ABNORMAL HIGH (ref 4.0–10.5)

## 2014-10-03 LAB — GLUCOSE, CAPILLARY
GLUCOSE-CAPILLARY: 131 mg/dL — AB (ref 70–99)
GLUCOSE-CAPILLARY: 169 mg/dL — AB (ref 70–99)
Glucose-Capillary: 133 mg/dL — ABNORMAL HIGH (ref 70–99)
Glucose-Capillary: 158 mg/dL — ABNORMAL HIGH (ref 70–99)
Glucose-Capillary: 159 mg/dL — ABNORMAL HIGH (ref 70–99)
Glucose-Capillary: 164 mg/dL — ABNORMAL HIGH (ref 70–99)

## 2014-10-03 LAB — TRIGLYCERIDES: TRIGLYCERIDES: 65 mg/dL (ref <150)

## 2014-10-03 LAB — PROCALCITONIN: PROCALCITONIN: 0.23 ng/mL

## 2014-10-03 MED ORDER — SODIUM CHLORIDE 0.9 % IV SOLN
INTRAVENOUS | Status: DC
  Administered 2014-10-03: 19:00:00 via INTRAVENOUS

## 2014-10-03 MED ORDER — FREE WATER
200.0000 mL | Freq: Three times a day (TID) | Status: DC
  Administered 2014-10-03 – 2014-10-04 (×3): 200 mL

## 2014-10-03 MED ORDER — CETYLPYRIDINIUM CHLORIDE 0.05 % MT LIQD
7.0000 mL | Freq: Four times a day (QID) | OROMUCOSAL | Status: DC
  Administered 2014-10-04 – 2014-10-05 (×7): 7 mL via OROMUCOSAL

## 2014-10-03 NOTE — Progress Notes (Signed)
**Note De-identified  Obfuscation** Sputum collected and sent to lab 

## 2014-10-03 NOTE — Progress Notes (Signed)
**Note De-Identified  Obfuscation** Patient did not tolerate TC trial due to rr 50's, hr 140's, and increased anxiety.  Pt. Continues to have copious secretions.  RT to continue to monitor.

## 2014-10-03 NOTE — Progress Notes (Signed)
PULMONARY / CRITICAL CARE MEDICINE   Name: Ricardo AsalDwight L Brauner MRN: [id number] DOB: 02/22/1933    ADMISSION DATE:  09/23/2014 CONSULTATION DATE:  09/24/14  REFERRING MD :  Dr. Conchita ParisNundkumar   CHIEF COMPLAINT:  VDRF   INITIAL PRESENTATION: 81 y/o M with a PMH of R sided hand / arm pain admitted on 3/25 for planned ACDF.  Early am 3/26, the patient developed agitation, hypoxia and respiratory distress.  He was emergently intubated per anesthesia (easy intubation but swollen).  PCCM consulted for ICU vent management.   STUDIES:  3/26  CT Neck >> post op prevertebral / retropharyngeal fluid & R neck hematoma, s/p C3-C7 ACDF, ETT in good position, lung apices with acute pulmonary edema  SIGNIFICANT EVENTS: 3/25  Admit for planned ACDF in setting of R sided hand / arm pain  3/26  Respiratory decompensation on floor, intubated per anesthesia  3/29 failed swallow -NG inserted 3/31- increased hypoxia, tachypnea.  ?HCAP v aspiration v atx.  Empiric abx started.  3/31 - self extubated 4/1 - reintubated, unable to clear secretions 4/2 - tracheostomy placed by [facility]  SUBJECTIVE:  Febrile Sedated with fentanyl Continued respiratory secretions Failed TC trial this AM  VITAL SIGNS: Temp:  [98.3 F (36.8 C)-102.7 F (39.3 C)] 100.8 F (38.2 C) (04/04 0831) Pulse Rate:  [72-110] 107 (04/04 1106) Resp:  [0-40] 25 (04/04 1000) BP: (104-185)/(44-85) 185/79 mmHg (04/04 1106) SpO2:  [92 %-100 %] 96 % (04/04 1000) FiO2 (%):  [40 %] 40 % (04/04 1123) Weight:  [80.3 kg (177 lb 0.5 oz)] 80.3 kg (177 lb 0.5 oz) (04/04 0300)   VENTILATOR SETTINGS: Vent Mode:  [-] PRVC FiO2 (%):  [40 %] 40 % Set Rate:  [18 bmp] 18 bmp Vt Set:  [600 mL] 600 mL PEEP:  [5 cmH20] 5 cmH20 Pressure Support:  [10 cmH20-12 cmH20] 12 cmH20 Plateau Pressure:  [14 cmH20-15 cmH20] 15 cmH20   INTAKE / OUTPUT:  Intake/Output Summary (Last 24 hours) at 10/03/14 1208 Last data filed at 10/03/14 1100  Gross per 24 hour  Intake  1878.65 ml  Output   1700 ml  Net 178.65 ml   PHYSICAL EXAMINATION: General:  wdwn adult male in mild distres Neuro: RASS -2 C-Collar in place HEENT:   mm pink/moist, anterior bruising, trach site clean Cardiovascular:  s1s2 rrr, no m/r/g Lungs:  resp's even/non-labored,  lungs bilateral crackles Abdomen:  Flat, soft, bsx4 active  Musculoskeletal:  No acute deformities  Skin:  Warm/dry, no edema   LABS:  CBC  Recent Labs Lab 10/01/14 0420 10/02/14 0420 10/03/14 0345  WBC 18.2* 13.3* 12.3*  HGB 11.9* 11.1* 10.6*  HCT 36.1* 34.0* 32.9*  PLT 362 320 285   BMET  Recent Labs Lab 10/01/14 0420 10/02/14 0420 10/03/14 0345  NA 145 145 147*  K 3.9 3.9 3.7  CL 112 108 109  CO2 29 27 28   BUN 40* 32* 27*  CREATININE 0.94 0.81 0.87  GLUCOSE 151* 141* 178*   Electrolytes  Recent Labs Lab 10/01/14 0420 10/02/14 0420 10/03/14 0345  CALCIUM 8.9 8.8 8.7   ABG  Recent Labs Lab 09/29/14 1102 09/29/14 1538  PHART 7.488* 7.459*  PCO2ART 34.7* 34.7*  PO2ART 135.0* 270.0*   Glucose  Recent Labs Lab 10/02/14 1211 10/02/14 1631 10/02/14 1929 10/02/14 2356 10/03/14 0356 10/03/14 0826  GLUCAP 113* 174* 156* 133* 158* 164*   Imaging Dg Chest Port 1 View  10/02/2014   CLINICAL DATA:  Acute respiratory failure and hypertension.  EXAM: PORTABLE CHEST - 1 VIEW  COMPARISON:  09/30/2014.  FINDINGS: Tracheostomy new in the interval. Feeding tube extends beyond the inferior aspect of the film. Midline trachea. Normal heart size. Atherosclerosis in the transverse aorta. Left costophrenic angle minimally excluded. No pleural effusion or pneumothorax. Hyperinflation. No lobar consolidation. Minimal atelectasis or scarring at the left lung base.  IMPRESSION: Hyperinflation, without acute disease.  Interval placement of tracheostomy, without acute complication.   Electronically Signed   By: Jeronimo Greaves M.D.   On: 10/02/2014 09:44   ASSESSMENT / PLAN:  PULMONARY OETT 3/26 >>  3/28, 3/31 >>3/31, 4/1 >>4/2 4/2 trach (wolicki) >> A: Acute Respiratory Failure - in setting of post-op discectomy due to R neck hematoma.  Recurrent 3/31 r/t?HCAP v atx R Neck Hematoma  Acute Pulmonary Edema -resolved ?HCAP -ATX, poor cough mechanics P:   Decadron per NSGY Pulm hygiene  SBTs with goal ATC  NEUROLOGIC A:   Cervical Spondylosis with Myelopathy s/p Discectomy at C3-C7 P:   RASS goal: 0/-1 Fentanyl  gtt as Bp permits for RASS goal Rec's per NSGY C-Collar  PT when resp status allows   CARDIOVASCULAR CVL None A:  HTN HLD P:  Hold home gemfibrozil, losartan Monitor hemodynamics  RENAL A:   Hypernatremia P:   Monitor BMP Replace electrolytes as indicated   GASTROINTESTINAL A:   Vent Associated Dysphagia  P:   TF's  PPI May need PEG based on swallow & progress with wean  HEMATOLOGIC A:   R Neck Hematoma   P:  Monitor CBC SCD's for DVT   INFECTIOUS A:   ?HCAP v aspiration  resp cx 3/31 >>GNR >> P:   ct empiric abx for ?HCAP v aspiration  Vanc 3/31>>> Zosyn 3/31>>> Repeat resp cx 4/4 >>> PCT algorithm   ENDOCRINE A:   At Risk Hypoglycemia     P:   Monitor CBG  FAMILY  - Updates: step daughter [name] 4/1   Joneen Roach, AGACNP-BC [facility] Pager [phone] or [phone]  10/03/2014 12:13 PM   PCCM ATTENDING: I have reviewed pt's initial presentation, consultants notes and hospital database in detail.  The above assessment and plan was formulated under my direction.  In summary: Prolonged VDRF after ACDF. Initially thought to be due to airway obstruction, neck hematoma. However, that should be fully addressed by trach tube placement. His CXR is nearly normal. Therefore, the reason for his persistent VDRF (failed attempts @ weaning this AM) is unclear. I raise the concern for phrenic nerve involvement given level of C spine abnormality.  Cont full vent support - settings reviewed and/or  adjusted Cont vent bundle Daily SBT if/when meets criteria Cont weaning as tolerated in PSV mode Repeat resp cx as RN reports significant airway secretions I would like to keep him in ICU at least through this day until we have a better sense of why he remains vent dependent  Billy Fischer, MD;  PCCM service; [place] [phone]

## 2014-10-04 ENCOUNTER — Inpatient Hospital Stay (HOSPITAL_COMMUNITY): Payer: Medicare Other

## 2014-10-04 LAB — BASIC METABOLIC PANEL
Anion gap: 3 — ABNORMAL LOW (ref 5–15)
BUN: 29 mg/dL — ABNORMAL HIGH (ref 6–23)
CALCIUM: 8.3 mg/dL — AB (ref 8.4–10.5)
CO2: 34 mmol/L — ABNORMAL HIGH (ref 19–32)
CREATININE: 0.9 mg/dL (ref 0.50–1.35)
Chloride: 110 mmol/L (ref 96–112)
GFR calc Af Amer: >90 mL/min (ref >90)
GFR, EST NON AFRICAN AMERICAN: 79 mL/min — AB (ref >90)
Glucose, Bld: 186 mg/dL — ABNORMAL HIGH (ref 70–99)
Potassium: 3.7 mmol/L (ref 3.5–5.1)
Sodium: 147 mmol/L — ABNORMAL HIGH (ref 135–145)

## 2014-10-04 LAB — CBC
HCT: 32 % — ABNORMAL LOW (ref 39.0–52.0)
Hemoglobin: 10 g/dL — ABNORMAL LOW (ref 13.0–17.0)
MCH: 28.8 pg (ref 26.0–34.0)
MCHC: 31.3 g/dL (ref 30.0–36.0)
MCV: 92.2 fL (ref 78.0–100.0)
Platelets: 265 10*3/uL (ref 150–400)
RBC: 3.47 MIL/uL — ABNORMAL LOW (ref 4.22–5.81)
RDW: 13.9 % (ref 11.5–15.5)
WBC: 12.4 10*3/uL — ABNORMAL HIGH (ref 4.0–10.5)

## 2014-10-04 LAB — GLUCOSE, CAPILLARY
GLUCOSE-CAPILLARY: 156 mg/dL — AB (ref 70–99)
GLUCOSE-CAPILLARY: 199 mg/dL — AB (ref 70–99)
Glucose-Capillary: 117 mg/dL — ABNORMAL HIGH (ref 70–99)
Glucose-Capillary: 146 mg/dL — ABNORMAL HIGH (ref 70–99)
Glucose-Capillary: 168 mg/dL — ABNORMAL HIGH (ref 70–99)
Glucose-Capillary: 177 mg/dL — ABNORMAL HIGH (ref 70–99)

## 2014-10-04 LAB — MAGNESIUM: Magnesium: 2.2 mg/dL (ref 1.5–2.5)

## 2014-10-04 LAB — PHOSPHORUS: Phosphorus: 2.6 mg/dL (ref 2.3–4.6)

## 2014-10-04 LAB — PROCALCITONIN: Procalcitonin: 0.2 ng/mL

## 2014-10-04 MED ORDER — FENTANYL CITRATE 0.05 MG/ML IJ SOLN: 50.0000 ug | INTRAMUSCULAR | Status: DC | PRN

## 2014-10-04 MED ORDER — FREE WATER
100.0000 mL | Freq: Three times a day (TID) | Status: DC
  Administered 2014-10-04 – 2014-10-05 (×4): 100 mL

## 2014-10-04 MED ORDER — FENTANYL CITRATE 0.05 MG/ML IJ SOLN
50.0000 ug | INTRAMUSCULAR | Status: DC | PRN
  Administered 2014-10-04: 50 ug via INTRAVENOUS
  Filled 2014-10-04: qty 2

## 2014-10-04 NOTE — Progress Notes (Signed)
30 ml Fentanyl drip wasted in sink in med room.  Witnessed by Pedro Earlshristopher Luck, RN

## 2014-10-04 NOTE — Progress Notes (Signed)
PULMONARY / CRITICAL CARE MEDICINE   Name: Ricardo Rivera MRN: [id number] DOB: 1932/09/24    ADMISSION DATE:  09/23/2014 CONSULTATION DATE:  09/24/14  REFERRING MD :  Dr. Conchita Paris   CHIEF COMPLAINT:  VDRF   INITIAL PRESENTATION: 81 y/o M with a PMH of R sided hand / arm pain admitted on 3/25 for planned ACDF.  Early am 3/26, the patient developed agitation, hypoxia and respiratory distress.  He was emergently intubated per anesthesia (easy intubation but swollen).  PCCM consulted for ICU vent management.   STUDIES:  3/26  CT Neck >> post op prevertebral / retropharyngeal fluid & R neck hematoma, s/p C3-C7 ACDF, ETT in good position, lung apices with acute pulmonary edema  SIGNIFICANT EVENTS: 3/25  Admit for planned ACDF in setting of R sided hand / arm pain  3/26  Respiratory decompensation on floor, intubated per anesthesia  3/29 failed swallow -NG inserted 3/31- increased hypoxia, tachypnea.  ?HCAP v aspiration v atx.  Empiric abx started.  3/31 - self extubated 4/1 - reintubated, unable to clear secretions 4/2 - tracheostomy placed by Dr. Lazarus Salines 4/5 - Transfer to SDU  SUBJECTIVE:  Fevers improved Continued respiratory secretions Some progress with ATC trial this AM  VITAL SIGNS: Temp:  [97.8 F (36.6 C)-101.4 F (38.6 C)] 98.5 F (36.9 C) (04/05 0700) Pulse Rate:  [71-117] 90 (04/05 0917) Resp:  [19-39] 30 (04/05 0917) BP: (108-185)/(46-79) 125/61 mmHg (04/05 0917) SpO2:  [96 %-99 %] 98 % (04/05 0900) FiO2 (%):  [40 %] 40 % (04/05 0917) Weight:  [79.3 kg (174 lb 13.2 oz)] 79.3 kg (174 lb 13.2 oz) (04/05 0400)   VENTILATOR SETTINGS: Vent Mode:  [-] PRVC FiO2 (%):  [40 %] 40 % Set Rate:  [18 bmp] 18 bmp Vt Set:  [600 mL] 600 mL PEEP:  [5 cmH20] 5 cmH20 Pressure Support:  [12 cmH20] 12 cmH20 Plateau Pressure:  [17 cmH20] 17 cmH20   INTAKE / OUTPUT:  Intake/Output Summary (Last 24 hours) at 10/04/14 1055 Last data filed at 10/04/14 0900  Gross per 24 hour   Intake   2490 ml  Output   1325 ml  Net   1165 ml   PHYSICAL EXAMINATION: General:  wdwn adult male in mild distres Neuro: RASS 0, C-Collar in place, nods/shakes head for yes/no HEENT:   mm pink/moist, anterior bruising, trach site clean Cardiovascular:  s1s2 rrr, no m/r/g Lungs:  resp's even/non-labored,  lungs bilateral crackles Abdomen:  Flat, soft, bsx4 active  Musculoskeletal:  No acute deformities  Skin:  Warm/dry, no edema   LABS:  CBC  Recent Labs Lab 10/02/14 0420 10/03/14 0345 10/04/14 0440  WBC 13.3* 12.3* 12.4*  HGB 11.1* 10.6* 10.0*  HCT 34.0* 32.9* 32.0*  PLT 320 285 265   BMET  Recent Labs Lab 10/02/14 0420 10/03/14 0345 10/04/14 0440  NA 145 147* 147*  K 3.9 3.7 3.7  CL 108 109 110  CO2 27 28 34*  BUN 32* 27* 29*  CREATININE 0.81 0.87 0.90  GLUCOSE 141* 178* 186*   Electrolytes  Recent Labs Lab 10/02/14 0420 10/03/14 0345 10/04/14 0440  CALCIUM 8.8 8.7 8.3*  MG  --   --  2.2  PHOS  --   --  2.6   ABG  Recent Labs Lab 09/29/14 1102 09/29/14 1538  PHART 7.488* 7.459*  PCO2ART 34.7* 34.7*  PO2ART 135.0* 270.0*   Glucose  Recent Labs Lab 10/03/14 1216 10/03/14 1627 10/03/14 2011 10/03/14 2340 10/04/14 0400 10/04/14  0730  GLUCAP 131* 169* 159* 146* 168* 156*   Imaging No results found. ASSESSMENT / PLAN:  PULMONARY OETT 3/26 >> 3/28, 3/31 >>3/31, 4/1 >>4/2 4/2 trach (wolicki) >> A: Acute Respiratory Failure - in setting of post-op discectomy due to R neck hematoma.  Recurrent 3/31 r/t?HCAP v atx R Neck Hematoma  Acute Pulmonary Edema -resolved ?HCAP -ATX, poor cough mechanics Difficulty weaning> Consider phrenic nerve involvement? P:   Decadron per NSGY Pulm hygiene  SBTs with goal ATC  NEUROLOGIC A:   Cervical Spondylosis with Myelopathy s/p Discectomy at C3-C7 P:   RASS goal: 0/-1 Sedation to PRN fentanyl, PRN diazepam Rec's per NSGY C-Collar  PT when resp status allows   CARDIOVASCULAR A:   HTN HLD P:  Hold home gemfibrozil, losartan Monitor hemodynamics  RENAL A:   Hypernatremia P:   Monitor BMP Replace electrolytes as indicated   GASTROINTESTINAL A:   Vent Associated Dysphagia  P:   TF's  PPI May need PEG based on swallow & progress with wean  HEMATOLOGIC A:   R Neck Hematoma   P:  Monitor CBC SCD's for DVT   INFECTIOUS A:   ?HCAP v aspiration  resp cx 3/31 >>GNR >> P:   ct empiric abx for ?HCAP v aspiration  Vanc 3/31>>> Zosyn 3/31>>> Repeat resp cx 4/4 >>> PCT algorithm > if continues to trend down consider DC ABX 4/6 (total 7 days)  ENDOCRINE A:   At Risk Hypoglycemia     P:   Monitor CBG add SSI if glucose consistently > 180.   FAMILY  - Updates: step daughter French Anaracy 4/1   Transfer to SDU 4/5   Joneen RoachPaul Hoffman, AGACNP-BC [facility] Pager [phone] or [phone]  10/04/2014 10:55 AM   PCCM ATTENDING: I have reviewed pt's initial presentation, consultants notes and hospital database in detail.  The above assessment and plan was formulated under my direction.  In summary: S/P ACDF Neck hematoma Trach status Much improved respiratory status today - tolerating ATC comfortably Still with copious mucopurulent trach secretions  Will DC systemic steroids as principle resp problem was not laryngeal edema Cont PSV > ATC as tolerated Transfer to vent SDU bed He will need LTACH for rehab and should be ready for this by end of week   Billy Fischeravid Simonds, MD;  PCCM service; Mobile [phone]

## 2014-10-04 NOTE — Progress Notes (Signed)
Pt seen and examined. No issues overnight.   EXAM: Temp:  [97.8 F (36.6 C)-101.4 F (38.6 C)] 98.2 F (36.8 C) (04/05 1100) Pulse Rate:  [71-102] 100 (04/05 1100) Resp:  [19-42] 42 (04/05 1100) BP: (108-170)/(46-81) 165/81 mmHg (04/05 1100) SpO2:  [96 %-99 %] 98 % (04/05 1100) FiO2 (%):  [40 %] 40 % (04/05 0917) Weight:  [79.3 kg (174 lb 13.2 oz)] 79.3 kg (174 lb 13.2 oz) (04/05 0400) Intake/Output      04/04 0701 - 04/05 0700 04/05 0701 - 04/06 0700   I.V. (mL/kg) 367.9 (4.6) 10 (0.1)   NG/GT 2070 150   IV Piggyback 112.5 12.5   Total Intake(mL/kg) 2550.4 (32.2) 172.5 (2.2)   Urine (mL/kg/hr) 1500 (0.8) 250 (0.7)   Stool 0 (0)    Total Output 1500 250   Net +1050.4 -77.5        Stool Occurrence 3 x     Awake, alert Follows commands Does move all extremities symmetrically  LABS: Lab Results  Component Value Date   CREATININE 0.90 10/04/2014   BUN 29* 10/04/2014   NA 147* 10/04/2014   K 3.7 10/04/2014   CL 110 10/04/2014   CO2 34* 10/04/2014   Lab Results  Component Value Date   WBC 12.4* 10/04/2014   HGB 10.0* 10/04/2014   HCT 32.0* 10/04/2014   MCV 92.2 10/04/2014   PLT 265 10/04/2014    IMPRESSION: - 81 y.o. male s/p trach for VDRF after 4 level ACDF.  - May have PNA, currently on abx, respiratory culture from yesterday pending. CXR today does demonstrate bibasilar infiltrate. - I think phrenic neuropathy is unlikely in this patient as the vocal cords are still mobile, there is no elevation of the right hemidiaphragm, and although possible, this would be an extremely rare complication of ACDF.  PLAN: - Cont abx and monitor cultures - Vent support as needed - Will get PT/OT to start mobilizing pt. - Will likely also need PEG

## 2014-10-05 ENCOUNTER — Inpatient Hospital Stay
Admission: AD | Admit: 2014-10-05 | Discharge: 2014-11-21 | Disposition: A | Discharge status: Skilled Nursing Facility | Payer: Medicare Other | Source: Ambulatory Visit | Attending: Internal Medicine | Admitting: Internal Medicine

## 2014-10-05 ENCOUNTER — Other Ambulatory Visit (HOSPITAL_COMMUNITY): Payer: Self-pay

## 2014-10-05 DIAGNOSIS — J9690 Respiratory failure, unspecified, unspecified whether with hypoxia or hypercapnia: Secondary | ICD-10-CM | POA: Insufficient documentation

## 2014-10-05 DIAGNOSIS — I420 Dilated cardiomyopathy: Secondary | ICD-10-CM

## 2014-10-05 DIAGNOSIS — R1310 Dysphagia, unspecified: Secondary | ICD-10-CM | POA: Insufficient documentation

## 2014-10-05 DIAGNOSIS — R918 Other nonspecific abnormal finding of lung field: Secondary | ICD-10-CM

## 2014-10-05 DIAGNOSIS — R140 Abdominal distension (gaseous): Secondary | ICD-10-CM

## 2014-10-05 DIAGNOSIS — Z931 Gastrostomy status: Secondary | ICD-10-CM

## 2014-10-05 LAB — BLOOD GAS, ARTERIAL
ACID-BASE EXCESS: 2.9 mmol/L — AB (ref 0.0–2.0)
Bicarbonate: 25.9 mEq/L — ABNORMAL HIGH (ref 20.0–24.0)
FIO2: 0.4 %
O2 Saturation: 99 %
PATIENT TEMPERATURE: 98.6
PEEP: 5 cmH2O
RATE: 33 resp/min
TCO2: 26.9 mmol/L (ref 0–100)
VT: 500 mL
pCO2 arterial: 32.9 mmHg — ABNORMAL LOW (ref 35.0–45.0)
pH, Arterial: 7.507 — ABNORMAL HIGH (ref 7.350–7.450)
pO2, Arterial: 118 mmHg — ABNORMAL HIGH (ref 80.0–100.0)

## 2014-10-05 LAB — CBC
HCT: 34.8 % — ABNORMAL LOW (ref 39.0–52.0)
Hemoglobin: 11.2 g/dL — ABNORMAL LOW (ref 13.0–17.0)
MCH: 29.5 pg (ref 26.0–34.0)
MCHC: 32.2 g/dL (ref 30.0–36.0)
MCV: 91.6 fL (ref 78.0–100.0)
Platelets: 275 10*3/uL (ref 150–400)
RBC: 3.8 MIL/uL — ABNORMAL LOW (ref 4.22–5.81)
RDW: 13.6 % (ref 11.5–15.5)
WBC: 12.7 10*3/uL — AB (ref 4.0–10.5)

## 2014-10-05 LAB — PROCALCITONIN: PROCALCITONIN: 0.11 ng/mL

## 2014-10-05 LAB — BASIC METABOLIC PANEL
Anion gap: 13 (ref 5–15)
BUN: 25 mg/dL — AB (ref 6–23)
CHLORIDE: 107 mmol/L (ref 96–112)
CO2: 25 mmol/L (ref 19–32)
Calcium: 8.5 mg/dL (ref 8.4–10.5)
Creatinine, Ser: 0.69 mg/dL (ref 0.50–1.35)
GFR calc Af Amer: >90 mL/min (ref >90)
GFR calc non Af Amer: 88 mL/min — ABNORMAL LOW (ref >90)
GLUCOSE: 132 mg/dL — AB (ref 70–99)
POTASSIUM: 3.4 mmol/L — AB (ref 3.5–5.1)
SODIUM: 145 mmol/L (ref 135–145)

## 2014-10-05 LAB — CULTURE, RESPIRATORY

## 2014-10-05 LAB — GLUCOSE, CAPILLARY
GLUCOSE-CAPILLARY: 116 mg/dL — AB (ref 70–99)
GLUCOSE-CAPILLARY: 143 mg/dL — AB (ref 70–99)
Glucose-Capillary: 132 mg/dL — ABNORMAL HIGH (ref 70–99)
Glucose-Capillary: 144 mg/dL — ABNORMAL HIGH (ref 70–99)

## 2014-10-05 LAB — CULTURE, RESPIRATORY W GRAM STAIN

## 2014-10-05 MED ORDER — HEPARIN SODIUM (PORCINE) 5000 UNIT/ML IJ SOLN: 5000.0000 [IU] | Freq: Three times a day (TID) | INTRAMUSCULAR | Status: AC

## 2014-10-05 MED ORDER — CHLORHEXIDINE GLUCONATE 0.12 % MT SOLN: 15.0000 mL | Freq: Two times a day (BID) | OROMUCOSAL | Status: AC

## 2014-10-05 MED ORDER — DIAZEPAM 5 MG PO TABS: 5.0000 mg | ORAL_TABLET | Freq: Four times a day (QID) | ORAL | Status: AC | PRN

## 2014-10-05 MED ORDER — SENNA 8.6 MG PO TABS: 1.0000 | ORAL_TABLET | Freq: Every day | ORAL | Status: AC | PRN

## 2014-10-05 MED ORDER — POTASSIUM CHLORIDE 20 MEQ/15ML (10%) PO SOLN
40.0000 meq | Freq: Once | ORAL | Status: AC
  Administered 2014-10-05: 40 meq
  Filled 2014-10-05: qty 30

## 2014-10-05 MED ORDER — FREE WATER: 100.0000 mL | Freq: Three times a day (TID) | Status: AC

## 2014-10-05 MED ORDER — METOPROLOL TARTRATE 1 MG/ML IV SOLN: 2.5000 mg | INTRAVENOUS | Status: AC | PRN

## 2014-10-05 MED ORDER — METHYLPREDNISOLONE (PAK) 4 MG PO TABS: ORAL_TABLET | ORAL | Status: AC

## 2014-10-05 MED ORDER — VITAL AF 1.2 CAL PO LIQD: 1000.0000 mL | ORAL | Status: AC

## 2014-10-05 MED ORDER — DOCUSATE SODIUM 100 MG PO CAPS: 100.0000 mg | ORAL_CAPSULE | Freq: Every day | ORAL | Status: AC | PRN

## 2014-10-05 NOTE — Evaluation (Signed)
Occupational Therapy Evaluation Patient Details Name: Ricardo Rivera MRN: [id number] DOB: 06/29/1933 Today's Date: 10/05/2014    History of Present Illness Pt is a 81 y/o M with a PMH of R sided hand / arm pain admitted on 3/25 for planned ACDF. Early am 3/26, the patient developed agitation, hypoxia and respiratory distress. He was emergently intubated. Pt self-extubated on 3/31, and was reintubated on 4/1. Pt had trach placed 4/2.    Clinical Impression   No family available and pt non verbal, therefore unable to obtain PLOF.  Pt presents with profound weakness requiring +2 total assist for bed level mobility and all ADL.  He currently remains ventilator dependent. Pt tolerated bed mobility, ROM of extremities and positioning in chair position without difficulty.  Will follow.    Follow Up Recommendations  LTACH    Equipment Recommendations       Recommendations for Other Services       Precautions / Restrictions Precautions Precautions: Fall;Cervical Required Braces or Orthoses: Cervical Brace Cervical Brace: Hard collar Restrictions Weight Bearing Restrictions: No      Mobility Bed Mobility Overal bed mobility: Needs Assistance;+2 for physical assistance Bed Mobility: Rolling Rolling: Total assist;+2 for physical assistance         General bed mobility comments: Total A +2 for all aspects of bed mobility.   Transfers                      Balance                                            ADL                                         General ADL Comments: Total assist, no volitional movement      Vision     Perception     Praxis      Pertinent Vitals/Pain Pain Assessment: Faces Faces Pain Scale: Hurts even more Pain Location: neck, with repositioning, reponds to painful stimuli Pain Descriptors / Indicators: Grimacing Pain Intervention(s): Monitored during session;Limited activity within patient's  tolerance     Hand Dominance     Extremity/Trunk Assessment Upper Extremity Assessment Upper Extremity Assessment: RUE deficits/detail;LUE deficits/detail RUE Deficits / Details: 0/5, mild edema, minimal reaction to painful stimuli LUE Deficits / Details: 0/5, mild edema, somewhat more reactive to painful stimuli   Lower Extremity Assessment Lower Extremity Assessment: Generalized weakness;RLE deficits/detail;LLE deficits/detail RLE Deficits / Details: No active movement noted. Very minimal reaction to painful stimuli on toes.  LLE Deficits / Details: No active movement noted. Pt with a more reactive response to painful stimuli on toes.    Cervical / Trunk Assessment Cervical / Trunk Assessment: Other exceptions Cervical / Trunk Exceptions: In hard collar throughout session - possible ROM limitations with L rotation. Holding head in a right rotated position.    Communication Communication Communication: Tracheostomy   Cognition Arousal/Alertness: Lethargic Behavior During Therapy: Flat affect Overall Cognitive Status: Difficult to assess                     General Comments       Exercises       Shoulder Instructions      Home Living Family/patient  expects to be discharged to::  (plan is for [facility] later today) Living Arrangements: Spouse/significant other                                      Prior Functioning/Environment          Comments: Pt is non verbal, no family available.    OT Diagnosis: Generalized weakness;Cognitive deficits;Acute pain   OT Problem List: Decreased strength;Decreased activity tolerance;Decreased cognition;Cardiopulmonary status limiting activity;Impaired UE functional use;Pain   OT Treatment/Interventions: Therapeutic activities;Therapeutic exercise;Cognitive remediation/compensation;Patient/family education    OT Goals(Current goals can be found in the care plan section) Acute Rehab OT Goals OT Goal Formulation:  Patient unable to participate in goal setting Time For Goal Achievement: 10/19/14 Potential to Achieve Goals: Fair ADL Goals Additional ADL Goal #1: Pt will follow simple verbal commands 25% of time. Additional ADL Goal #2: Pt will roll with +2 max assist for positioning. Additional ADL Goal #3: Caregivers will be knowledgeable in B UE ROM and positioning.  OT Frequency: Min 1X/week   Barriers to D/C:            Co-evaluation PT/OT/SLP Co-Evaluation/Treatment: Yes Reason for Co-Treatment: Complexity of the patient's impairments (multi-system involvement);For patient/therapist safety PT goals addressed during session: Mobility/safety with mobility OT goals addressed during session: Strengthening/ROM      End of Session Equipment Utilized During Treatment: Other (comment) (ventilator)  Activity Tolerance: Patient tolerated treatment well Patient left: in bed (bed in chair position)   Time: 1610-9604 OT Time Calculation (min): 28 min Charges:  OT General Charges $OT Visit: 1 Procedure OT Evaluation $Initial OT Evaluation Tier I: 1 Procedure G-Codes:    Evern Bio 10/05/2014, 2:44 PM  [phone]

## 2014-10-05 NOTE — Progress Notes (Signed)
[facility]  Answered all questions.

## 2014-10-05 NOTE — Progress Notes (Signed)
PULMONARY / CRITICAL CARE MEDICINE   Name: Ricardo Rivera MRN: [id number] DOB: 05/27/1933    ADMISSION DATE:  09/23/2014 CONSULTATION DATE:  09/24/14  REFERRING MD :  Dr. Conchita ParisNundkumar   CHIEF COMPLAINT:  VDRF   INITIAL PRESENTATION: 81 y/o M with a PMH of R sided hand / arm pain admitted on 3/25 for planned ACDF.  Early am 3/26, the patient developed agitation, hypoxia and respiratory distress.  He was emergently intubated per anesthesia (easy intubation but swollen).  PCCM consulted for ICU vent management.   STUDIES:  3/26  CT Neck >> post op prevertebral / retropharyngeal fluid & R neck hematoma, s/p C3-C7 ACDF, ETT in good position, lung apices with acute pulmonary edema  SIGNIFICANT EVENTS: 3/25  Admit for planned ACDF in setting of R sided hand / arm pain  3/26  Respiratory decompensation on floor, intubated per anesthesia  3/29 failed swallow -NG inserted 3/31- increased hypoxia, tachypnea.  ?HCAP v aspiration v atx.  Empiric abx started.  3/31 - self extubated 4/1 - reintubated, unable to clear secretions 4/2 - tracheostomy placed by Dr. Lazarus SalinesWolicki 4/5 - Transfer to SDU  SUBJECTIVE:  Fevers improved Still weak.   VITAL SIGNS: Temp:  [97.1 F (36.2 C)-101.2 F (38.4 C)] 101.2 F (38.4 C) (04/06 0818) Pulse Rate:  [77-105] 98 (04/06 1000) Resp:  [24-47] 36 (04/06 1000) BP: (109-165)/(60-81) 150/66 mmHg (04/06 1014) SpO2:  [91 %-99 %] 92 % (04/06 1000) FiO2 (%):  [35 %-40 %] 40 % (04/06 1014) Weight:  [79.8 kg (175 lb 14.8 oz)] 79.8 kg (175 lb 14.8 oz) (04/06 0500)   VENTILATOR SETTINGS: Vent Mode:  [-] PRVC FiO2 (%):  [35 %-40 %] 40 % Set Rate:  [18 bmp] 18 bmp Vt Set:  [600 mL] 600 mL Plateau Pressure:  [21 cmH20] 21 cmH20   INTAKE / OUTPUT:  Intake/Output Summary (Last 24 hours) at 10/05/14 1056 Last data filed at 10/05/14 1000  Gross per 24 hour  Intake   1785 ml  Output   2005 ml  Net   -220 ml   PHYSICAL EXAMINATION: General:  wdwn adult male resting on  vent  Neuro: RASS 0, C-Collar in place, nods/shakes head for yes/no HEENT:   mm pink/moist, anterior bruising, trach site clean Cardiovascular:  s1s2 rrr, no m/r/g Lungs:  resp's even/non-labored,  lungs bilateral crackles Abdomen:  Flat, soft, bsx4 active  Musculoskeletal:  No acute deformities  Skin:  Warm/dry, no edema   LABS:  CBC  Recent Labs Lab 10/03/14 0345 10/04/14 0440 10/05/14 0225  WBC 12.3* 12.4* 12.7*  HGB 10.6* 10.0* 11.2*  HCT 32.9* 32.0* 34.8*  PLT 285 265 275   BMET  Recent Labs Lab 10/03/14 0345 10/04/14 0440 10/05/14 0225  NA 147* 147* 145  K 3.7 3.7 3.4*  CL 109 110 107  CO2 28 34* 25  BUN 27* 29* 25*  CREATININE 0.87 0.90 0.69  GLUCOSE 178* 186* 132*   Electrolytes  ABG  Recent Labs Lab 09/29/14 1102 09/29/14 1538  PHART 7.488* 7.459*  PCO2ART 34.7* 34.7*  PO2ART 135.0* 270.0*    Recent Labs Lab 10/04/14 1133 10/04/14 1649 10/04/14 2035 10/04/14 2342 10/05/14 0348 10/05/14 0801  GLUCAP 117* 177* 199* 116* 143* 132*   Imaging Dg Chest Port 1 View  10/04/2014   CLINICAL DATA:  Respiratory failure .  EXAM: PORTABLE CHEST - 1 VIEW  COMPARISON:  10/02/2014 .  FINDINGS: Tracheostomy tube in stable position. Dobbhoff tube noted with tip in  this stomach. Heart size and pulmonary vascularity normal. Bibasilar atelectasis and/or infiltrates are noted. No pleural effusion or pneumothorax. Prior cervical spine fusion.  IMPRESSION: 1. Tracheostomy tube in stable position. Dobbhoff tube noted with tip in the stomach. 2. Bibasilar atelectasis and/or infiltrates.   Electronically Signed   By: Maisie Fus  Register   On: 10/04/2014 07:22   ASSESSMENT / PLAN:   Cervical Spondylosis with Myelopathy s/p Discectomy at C3-C7 Acute Respiratory Failure - in setting of post-op discectomy due to R neck hematoma.   HCAP v aspiration. Sputum was NPF-->treated w/ 7d zosyn. Tracheostomy status (3/31-ENT) Difficulty weaning: mostly d/t poor mucous clearance  and ATX.  HTN HLD Hypernatremia-->slowly improving  Mild hypokalemia  Vent Associated Dysphagia    Discussion  He continues to do somewhat poorly with trach collar trials. Think this is primarily deconditioning and poor mucous clearance. He has completed 7 days zosyn. We will stop abx. Ready for LTAC. We will follow him at [facility] for weaning efforts. Focus should be supportive and include: vent weaning, nutritional support, and rehab efforts.    Plan:   Decadron per NSGY, wean soon?  Pulm hygiene  SBTs with goal ATC C-Collar  Sedation to PRN fentanyl, PRN diazepam PT when resp status allows  D/c abx; Re-culture sputum if spikes fever or sputum changes.  Hold home gemfibrozil, losartan Cont free water Monitor BMP & CBC SCD's for DVT Cont TF's: May need PEG based on swallow & progress with wean  Simonne Martinet ACNP-BC [facility] Pulmonary/Critical Care Pager # [phone] OR # [phone] if no answer   10/05/2014 10:56 AM   PCCM ATTENDING: I have reviewed pt's initial presentation, consultants notes and hospital database in detail.  The above assessment and plan was formulated under my direction.  In summary: S/P ACDF Neck hematoma Trach status Prolonged, intermittent VDRF Still with copious mucopurulent trach secretions Deconditioning  Cont PSV > ATC as tolerated Transfer to [facility] today. PCCM will cont to follow there   Billy Fischer, MD;  PCCM service; Mobile [phone]

## 2014-10-05 NOTE — Progress Notes (Signed)
**Note De-Identified  Obfuscation** Patient transported on 35% VM to 5700.  Patient tolerated well.

## 2014-10-05 NOTE — Discharge Summary (Signed)
Physician Discharge Summary  Patient ID: JAMALL STROHMEIER MRN: [id number] DOB/AGE: 79-06-1933 79 y.o.  Admit date: 09/23/2014 Discharge date: 10/05/2014  Admission Diagnoses: Cervical spondylosis with myelopathy  Discharge Diagnoses: Same Active Problems:   Cervical spondylosis with myelopathy   Acute respiratory failure with hypoxia   Aspiration pneumonitis   Discharged Condition: Stable  Hospital Course:  Mrs. NGOC DAUGHTRIDGE is a 81 y.o. male initially admitted after undergoing 4 level ACDF for cervical spondylotic myelopathy. Early in the morning of postoperative day #1, he developed stridor, and was emergently intubated. CT scan was done of the neck which demonstrated generalized edema, with some component of hematoma. It was not felt that he required operative decompression. He was placed on steroids, and monitored in the ICU, and was extubated 2 days later. He did relatively well for a few days, but again required reintubation. He was placed on antibiotics at this point for concern for aspiration pneumonia. Subsequent to this, he self extubated, and the next morning appeared to be in some respiratory distress, and was again re-intubated. With his prolonged need for mechanical ventilation, and airway preservation, he underwent tracheostomy. After his tracheostomy, he continued to require intermittent ventilator support, and was therefore placed in a long-term acute care hospital. He completed a 7 day course of antibiotics.  Treatments: Surgery - anterior cervical discectomy and fusion C3-C7 Tracheostomy  Discharge Exam: Blood pressure 121/58, pulse 98, temperature 102.8 F (39.3 C), temperature source Oral, resp. rate 36, height  (1.803 m), weight 79.8 kg (175 lb 14.8 oz), SpO2 92 %. Awake, alert,  Breathes spontaneously Moves all extremities, although does require stimulation. Wound c/d/i  Follow-up: Follow-up in my office [facility] [phone]) in  3-4 weeks  Disposition: LTACH     Medication List    STOP taking these medications        HYDROcodone-acetaminophen 5-325 MG per tablet  Commonly known as:  NORCO/VICODIN      TAKE these medications        acetaminophen 500 MG tablet  Commonly known as:  TYLENOL  Take 500 mg by mouth every 6 (six) hours as needed for mild pain or moderate pain.     ALPRAZolam 0.5 MG tablet  Commonly known as:  XANAX  Take 0.5 mg by mouth at bedtime as needed for sleep.     aspirin 81 MG tablet  Take 81 mg by mouth daily.     brimonidine 0.1 % Soln  Commonly known as:  ALPHAGAN P  Place 1 drop into both eyes 3 (three) times daily.     CENTRUM SILVER ADULT 50+ PO  Take 1 tablet by mouth daily.     chlorhexidine 0.12 % solution  Commonly known as:  PERIDEX  15 mLs by Mouth Rinse route 2 (two) times daily.     diazepam 5 MG tablet  Commonly known as:  VALIUM  Take 1 tablet (5 mg total) by mouth every 6 (six) hours as needed for muscle spasms.     docusate sodium 100 MG capsule  Commonly known as:  COLACE  Take 1 capsule (100 mg total) by mouth daily as needed for mild constipation.     feeding supplement (VITAL AF 1.2 CAL) Liqd  Place 1,000 mLs into feeding tube continuous.     free water Soln  Place 100 mLs into feeding tube every 8 (eight) hours.     gabapentin 300 MG capsule  Commonly known as:  NEURONTIN  Take 1 capsule (  300 mg total) by mouth at bedtime.     gemfibrozil 600 MG tablet  Commonly known as:  LOPID  Take 600 mg by mouth 2 (two) times daily before a meal.     heparin 5000 UNIT/ML injection  Inject 1 mL (5,000 Units total) into the skin every 8 (eight) hours.     latanoprost 0.005 % ophthalmic solution  Commonly known as:  XALATAN  Place 1 drop into both eyes at bedtime.     losartan 50 MG tablet  Commonly known as:  COZAAR  Take 50 mg by mouth daily.     methylPREDNIsolone 4 MG tablet  Commonly known as:  MEDROL DOSPACK  follow package directions      metoprolol 1 MG/ML injection  Commonly known as:  LOPRESSOR  Inject 2.5-5 mLs (2.5-5 mg total) into the vein every 3 (three) hours as needed.     oxybutynin 5 MG tablet  Commonly known as:  DITROPAN  Take 5 mg by mouth daily.     pantoprazole 40 MG tablet  Commonly known as:  PROTONIX  Take 40 mg by mouth daily.     senna 8.6 MG Tabs tablet  Commonly known as:  SENOKOT  Take 1 tablet (8.6 mg total) by mouth daily as needed for mild constipation.     Vitamin D 2000 UNITS Caps  Take 2,000 Units by mouth daily.         SignedLisbeth Renshaw: Omare Bilotta, C 10/05/2014, 12:45 PM

## 2014-10-05 NOTE — Evaluation (Signed)
Physical Therapy Evaluation Patient Details Name: REILLEY LATORRE MRN: [id number] DOB: February 10, 1933 Today's Date: 10/05/2014   History of Present Illness  Pt is a 81 y/o M with a PMH of R sided hand / arm pain admitted on 3/25 for planned ACDF. Early am 3/26, the patient developed agitation, hypoxia and respiratory distress. He was emergently intubated. Pt self-extubated on 3/31, and was reintubated on 4/1. Pt had trach placed 4/2.   Clinical Impression  Pt admitted with above diagnosis. Pt currently with functional limitations due to the deficits listed below (see PT Problem List). At the time of PT eval pt required total assist +2 for all aspects of mobility. Pt was repositioned in bed to promote a neutral spine, and bed was put in chair position. Pillows placed under arms for edema control. Pt will benefit from skilled PT to increase their independence and safety with mobility to allow discharge to the venue listed below.       Follow Up Recommendations LTACH;Supervision/Assistance - 24 hour    Equipment Recommendations  Other (comment) (TBD as pt progresses with therapy)    Recommendations for Other Services       Precautions / Restrictions Precautions Precautions: Fall;Cervical Required Braces or Orthoses: Cervical Brace Cervical Brace: Hard collar Restrictions Weight Bearing Restrictions: No      Mobility  Bed Mobility Overal bed mobility: Needs Assistance;+2 for physical assistance Bed Mobility: Rolling Rolling: Total assist;+2 for physical assistance         General bed mobility comments: Total A +2 for all aspects of bed mobility.   Transfers                    Ambulation/Gait                Stairs            Wheelchair Mobility    Modified Rankin (Stroke Patients Only)       Balance                                             Pertinent Vitals/Pain Pain Assessment: Faces Faces Pain Scale: Hurts even more Pain  Location: Neck during positioning; To painful stimuli during sensation testing;  Pain Intervention(s): Monitored during session;Repositioned    Home Living Family/patient expects to be discharged to:: Private residence Living Arrangements: Spouse/significant other                    Prior Function           Comments: Pt unable to provide any history at the time of PT eval. Will defer until communication has improved or family can be contacted.     Hand Dominance        Extremity/Trunk Assessment   Upper Extremity Assessment: Defer to OT evaluation;Generalized weakness           Lower Extremity Assessment: Generalized weakness;RLE deficits/detail;LLE deficits/detail RLE Deficits / Details: No active movement noted. Very minimal reaction to painful stimuli on toes.  LLE Deficits / Details: No active movement noted. Pt with a more reactive response to painful stimuli on toes.   Cervical / Trunk Assessment: Other exceptions  Communication   Communication: Tracheostomy  Cognition Arousal/Alertness: Lethargic Behavior During Therapy: Flat affect Overall Cognitive Status: Difficult to assess  General Comments      Exercises        Assessment/Plan    PT Assessment Patient needs continued PT services  PT Diagnosis Difficulty walking;Generalized weakness   PT Problem List Decreased strength;Decreased range of motion;Decreased activity tolerance;Decreased balance;Decreased mobility;Decreased knowledge of use of DME;Decreased safety awareness;Decreased knowledge of precautions;Cardiopulmonary status limiting activity;Pain;Decreased skin integrity  PT Treatment Interventions DME instruction;Gait training;Stair training;Functional mobility training;Therapeutic activities;Therapeutic exercise;Neuromuscular re-education;Patient/family education;Wheelchair mobility training   PT Goals (Current goals can be found in the Care Plan section)  Acute Rehab PT Goals PT Goal Formulation: Patient unable to participate in goal setting Time For Goal Achievement: 10/19/14 Potential to Achieve Goals: Fair    Frequency Min 3X/week   Barriers to discharge        Co-evaluation PT/OT/SLP Co-Evaluation/Treatment: Yes Reason for Co-Treatment: Complexity of the patient's impairments (multi-system involvement);For patient/therapist safety PT goals addressed during session: Mobility/safety with mobility         End of Session Equipment Utilized During Treatment: Oxygen Activity Tolerance: Patient limited by lethargy Patient left: in bed;with call bell/phone within reach (Bed in chair position) Nurse Communication: Other (comment) (NT in room at end of session)         Time: [id number] PT Time Calculation (min) (ACUTE ONLY): 28 min   Charges:   PT Evaluation $Initial PT Evaluation Tier I: 1 Procedure     PT G Codes:        Conni SlipperKirkman, Dorla Guizar 10/05/2014, 1:00 PM  Conni SlipperLaura Zhane Donlan, PT, DPT Acute Rehabilitation Services Pager: [phone]

## 2014-10-05 NOTE — Progress Notes (Signed)
Called pts daughter to inform her that pt will be moving to new room

## 2014-10-05 NOTE — Progress Notes (Signed)
Pt transported to [facility] with respiratory on 35% trach collar.  Pt tolerated well

## 2014-10-05 NOTE — Progress Notes (Signed)
Notified Dr. Sung AmabileSimonds of temp 102.8. MD aware.

## 2014-10-06 ENCOUNTER — Other Ambulatory Visit (HOSPITAL_COMMUNITY): Payer: Self-pay

## 2014-10-06 LAB — COMPREHENSIVE METABOLIC PANEL
ALK PHOS: 51 U/L (ref 39–117)
ALT: 30 U/L (ref 0–53)
ANION GAP: 13 (ref 5–15)
AST: 19 U/L (ref 0–37)
Albumin: 1.6 g/dL — ABNORMAL LOW (ref 3.5–5.2)
BILIRUBIN TOTAL: 0.7 mg/dL (ref 0.3–1.2)
BUN: 28 mg/dL — AB (ref 6–23)
CHLORIDE: 106 mmol/L (ref 96–112)
CO2: 23 mmol/L (ref 19–32)
Calcium: 8.5 mg/dL (ref 8.4–10.5)
Creatinine, Ser: 0.69 mg/dL (ref 0.50–1.35)
GFR, EST NON AFRICAN AMERICAN: 88 mL/min — AB (ref >90)
GLUCOSE: 183 mg/dL — AB (ref 70–99)
POTASSIUM: 3.6 mmol/L (ref 3.5–5.1)
Sodium: 142 mmol/L (ref 135–145)
Total Protein: 7.5 g/dL (ref 6.0–8.3)

## 2014-10-06 LAB — CBC
HCT: 33.7 % — ABNORMAL LOW (ref 39.0–52.0)
Hemoglobin: 11.1 g/dL — ABNORMAL LOW (ref 13.0–17.0)
MCH: 29.7 pg (ref 26.0–34.0)
MCHC: 32.9 g/dL (ref 30.0–36.0)
MCV: 90.1 fL (ref 78.0–100.0)
Platelets: 300 10*3/uL (ref 150–400)
RBC: 3.74 MIL/uL — ABNORMAL LOW (ref 4.22–5.81)
RDW: 13.6 % (ref 11.5–15.5)
WBC: 10.7 10*3/uL — AB (ref 4.0–10.5)

## 2014-10-06 LAB — CLOSTRIDIUM DIFFICILE BY PCR: CDIFFPCR: NEGATIVE

## 2014-10-06 LAB — PROTIME-INR
INR: 1.25 (ref 0.00–1.49)
PROTHROMBIN TIME: 15.8 s — AB (ref 11.6–15.2)

## 2014-10-06 LAB — APTT: aPTT: 32 seconds (ref 24–37)

## 2014-10-08 ENCOUNTER — Other Ambulatory Visit (HOSPITAL_COMMUNITY): Payer: Self-pay

## 2014-10-08 LAB — HEMOGLOBIN A1C
Hgb A1c MFr Bld: 6.3 % — ABNORMAL HIGH (ref 4.8–5.6)
Mean Plasma Glucose: 134 mg/dL

## 2014-10-09 DIAGNOSIS — R1310 Dysphagia, unspecified: Secondary | ICD-10-CM | POA: Insufficient documentation

## 2014-10-09 DIAGNOSIS — J9690 Respiratory failure, unspecified, unspecified whether with hypoxia or hypercapnia: Secondary | ICD-10-CM | POA: Insufficient documentation

## 2014-10-09 NOTE — Consult Note (Signed)
Reason for consult: gastrostomy tube placement  Referring Physician(s): Dr. Sharyon Medicus  History of Present Illness: Ricardo Rivera is a 81 y.o. male with history of respiratory arrest post cervical ACDF secondary to right neck hematoma, s/p trach 10/01/14, HCAP, HTN, vent associated dysphagia. He currently is receiving tube feeds via NG. Request now received for perc gastrostomy tube placement.   Past Medical History  Diagnosis Date  . Hypertension   . Shortness of breath dyspnea   . Arthritis   . Hyperlipemia   . Frequent urination   . Nocturia     Past Surgical History  Procedure Laterality Date  . Spinal surgery      fusion  . Spinal fusion  1986  . Shoulder surgies Bilateral   . Lower back  1970  . Cholecystectomy  1998  . Anterior cervical decompression/discectomy fusion 4 levels N/A 09/23/2014    Procedure: CERVICAL THREE-FOUR,CERVICAL FOUR-FIVE,CERVICAL FIVE-SIX,CERVICAL SIX-SEVEN,ANTERIOR CERVICAL DECOMPRESSION WITH FUSION INTERBODY PROSTHESIS PLATING AND BONEGRAFT.;  Surgeon: Lisbeth Renshaw, MD;  Location: [facility];  Service: Neurosurgery;  Laterality: N/A;  . Tracheostomy tube placement N/A 10/01/2014    Procedure: TRACHEOSTOMY;  Surgeon: Flo Shanks, MD;  Location: [facility] OR;  Service: ENT;  Laterality: N/A;  . Direct laryngoscopy N/A 10/01/2014    Procedure: DIRECT LARYNGOSCOPY;  Surgeon: Flo Shanks, MD;  Location: [facility] OR;  Service: ENT;  Laterality: N/A;    Allergies: Flexeril and Tramadol  Medications: Prior to Admission medications   Medication Sig Start Date End Date Taking? Authorizing Provider  acetaminophen (TYLENOL) 500 MG tablet Take 500 mg by mouth every 6 (six) hours as needed for mild pain or moderate pain.     Historical Provider, MD  ALPRAZolam Prudy Feeler) 0.5 MG tablet Take 0.5 mg by mouth at bedtime as needed for sleep.     Historical Provider, MD  aspirin 81 MG tablet Take 81 mg by mouth daily.      Historical Provider, MD  brimonidine  (ALPHAGAN P) 0.1 % SOLN Place 1 drop into both eyes 3 (three) times daily.     Historical Provider, MD  chlorhexidine (PERIDEX) 0.12 % solution 15 mLs by Mouth Rinse route 2 (two) times daily. 10/05/14   Lisbeth Renshaw, MD  Cholecalciferol (VITAMIN D) 2000 UNITS CAPS Take 2,000 Units by mouth daily.     Historical Provider, MD  diazepam (VALIUM) 5 MG tablet Take 1 tablet (5 mg total) by mouth every 6 (six) hours as needed for muscle spasms. 10/05/14   Lisbeth Renshaw, MD  docusate sodium (COLACE) 100 MG capsule Take 1 capsule (100 mg total) by mouth daily as needed for mild constipation. 10/05/14   Lisbeth Renshaw, MD  gabapentin (NEURONTIN) 300 MG capsule Take 1 capsule (300 mg total) by mouth at bedtime. Patient not taking: Reported on 09/01/2014 01/01/14   Nelva Nay, MD  gemfibrozil (LOPID) 600 MG tablet Take 600 mg by mouth 2 (two) times daily before a meal.      Historical Provider, MD  heparin 5000 UNIT/ML injection Inject 1 mL (5,000 Units total) into the skin every 8 (eight) hours. 10/05/14   Lisbeth Renshaw, MD  latanoprost (XALATAN) 0.005 % ophthalmic solution Place 1 drop into both eyes at bedtime.    Historical Provider, MD  losartan (COZAAR) 50 MG tablet Take 50 mg by mouth daily.      Historical Provider, MD  methylPREDNIsolone (MEDROL DOSPACK) 4 MG tablet follow package directions 10/05/14   Lisbeth Renshaw, MD  metoprolol (LOPRESSOR) 1  MG/ML injection Inject 2.5-5 mLs (2.5-5 mg total) into the vein every 3 (three) hours as needed. 10/05/14   Lisbeth Renshaw, MD  Multiple Vitamins-Minerals (CENTRUM SILVER ADULT 50+ PO) Take 1 tablet by mouth daily.    Historical Provider, MD  Nutritional Supplements (FEEDING SUPPLEMENT, VITAL AF 1.2 CAL,) LIQD Place 1,000 mLs into feeding tube continuous. 10/05/14   Lisbeth Renshaw, MD  oxybutynin (DITROPAN) 5 MG tablet Take 5 mg by mouth daily.    Historical Provider, MD  pantoprazole (PROTONIX) 40 MG tablet Take 40 mg by mouth daily.    Historical  Provider, MD  senna (SENOKOT) 8.6 MG TABS tablet Take 1 tablet (8.6 mg total) by mouth daily as needed for mild constipation. 10/05/14   Lisbeth Renshaw, MD  Water For Irrigation, Sterile (FREE WATER) SOLN Place 100 mLs into feeding tube every 8 (eight) hours. 10/05/14   Lisbeth Renshaw, MD     No family history on file.  History   Social History  . Marital Status: Married    Spouse Name: N/A  . Number of Children: N/A  . Years of Education: N/A   Social History Main Topics  . Smoking status: Current Some Day Smoker -- 0.25 packs/day for 50 years  . Smokeless tobacco: Never Used  . Alcohol Use: No  . Drug Use: No  . Sexual Activity: Not on file   Other Topics Concern  . Not on file   Social History Narrative      Review of Systems pt unable to provide info  Vital Signs: BP 150/61,  HR 86, AF, O2 sats 98%  Physical Exam pt awake, not speaking but tracks with eyes, trach/NG intact; chest- few bibasilar crackles; heart- RRR; abd- soft,+BS,NT; ext- no edema  Mallampati Score:     Imaging: Ct Abdomen Wo Contrast  10/08/2014   CLINICAL DATA:  Evaluate anatomy for percutaneous gastrostomy tube placement. History tracheostomy and cholecystectomy. Patient on ventilator.  EXAM: CT ABDOMEN WITHOUT CONTRAST  TECHNIQUE: Multidetector CT imaging of the abdomen was performed following the standard protocol without IV contrast.  COMPARISON:  Neck CT 09/24/2014.  FINDINGS: Lower chest: There is a focus of consolidation at the right lung base to lesser degree the left lung base.  Hepatobiliary:  Postcholecystectomy.  Pancreas: Pancreas is normal. No ductal dilatation. No pancreatic inflammation.  Spleen: Normal spleen  Adrenals/urinary tract: Adrenal glands are normal. The kidneys and ureters are normal. The bladder is distended. There is a Foley catheter within the bladder. The degree of distension is more than expected with a Foley catheter in place new  Stomach/Bowel: Feeding tube with  weighted tip in the gastric fundus. The duodenum and small bowel are normal. The colon, appendix, and rectum are normal.  Vascular/Lymphatic: Abdominal aorta is normal caliber with atherosclerotic calcification. There is no retroperitoneal or periportal lymphadenopathy. No pelvic lymphadenopathy.  Reproductive: Prostate is normal.  Musculoskeletal: No aggressive osseous lesion.  Other: No free fluid.  IMPRESSION: 1. Bibasilar consolidation right greater than left is concerning for aspiration pneumonitis. 2. Feeding tube with weighted tip in the stomach. 3. Mild to moderate bladder distension with Foley catheter in place. Consider flushing catheter to ensure adequate drainage. These results will be called to the ordering clinician or representative by the Radiologist Assistant, and communication documented in the PACS or zVision Dashboard.   Electronically Signed   By: Genevive Bi M.D.   On: 10/08/2014 11:16   Dg Cervical Spine 2-3 Views  09/23/2014   CLINICAL DATA:  ACDF,  C3-4, C4-5, C5-6, and C6-7.  EXAM: DG C-ARM 61-120 MIN; CERVICAL SPINE - 2-3 VIEW  : COMPARISON:  Cervical spine MRI 01/14/2014  FINDINGS: Discectomy at C3-4, C4-5, C5-6, and C6-7. There is a ventral plate and screw fixation which is in good position. The intervertebral grafts are well located. There is no appreciable endplate fracture. Open wound with packing. Endotracheal and orogastric tubes.  IMPRESSION: C3-4, C4-5, C5-6, C6-7 ACDF with plate.  No adverse findings.   Electronically Signed   By: Marnee Spring M.D.   On: 09/23/2014 12:29   Ct Soft Tissue Neck Wo Contrast  09/24/2014   CLINICAL DATA:  81 year old male with respiratory distress and neck swelling status post cervical spine fusion yesterday. Intubated. Noncontrast study requested. Initial encounter.  EXAM: CT NECK WITHOUT CONTRAST  TECHNIQUE: Multidetector CT imaging of the neck was performed following the standard protocol without intravenous contrast.  COMPARISON:   Portable chest radiograph 0806 hours.  FINDINGS: Endotracheal tube in place, terminates at the level of the brachiocephalic artery. Mild synechiae in the trachea is distal to the tube. Visualized lungs remarkable for prominent pulmonary vasculature and bilateral septal thickening. There is also dependent pulmonary opacity and trace or small volume pleural fluid suggested.  In the visualized superior mediastinum there is mild lymph node prominence. Increased right peritracheal density on the it lowest image series 201, image 118 might be related to the azygos vein, uncertain.  Sequelae of cervical ACDF from the C3 to the C7 level.  Prevertebral/retropharyngeal Fluid with trace gas is maximal at the C2-C3 level measuring up to 15 mm in thickness. This extends from the level of the nasopharynx on the left caudally to the level of the superior thyroid gland in the midline. Similar indistinct fluid/hematoma along the right side operative approach above the thyroid. Mild posterior displacement of the right carotid sheath on that side. Intermediate density lateral to the right thyroid cartilage is compatible with hematoma encompassing 39 x 23 x 57 mm (AP by transverse by CC). Superimposed scattered trans spatial postoperative gas including in the submandibular spaces.  Endotracheal tube in place with fluid in the pharynx above the balloon surrounding the tube.  Calcified atherosclerosis at the skull base. Calcified carotid bifurcation atherosclerosis greater on the left. No cervical lymphadenopathy. Negative non contrast parotid and submandibular glands. Grossly negative visible non contrast brain parenchyma. Visualized paranasal sinuses and mastoids are clear.  Prominent right maxillary residual molar periapical lucency. Mandible intact.  Degenerative changes in the cervical spine superimposed on the postoperative findings. Osteopenia. There may be a nondisplaced fracture through the inferior anterior endplate of C5 as  seen on coronal image 59. There is hardware streak artifact interfering with bone detail. Hardware appears intact.  IMPRESSION: 1. Postoperative prevertebral/retropharyngeal fluid and right neck hematoma (anterior to the right carotid space, lateral to the right thyroid cartilage) status post C3-C7 level ACDF. 2. Endotracheal tube appears in good position. Abnormal lung apices suggestive of acute pulmonary edema with dependent atelectasis and small volume pleural effusions. 3. Questionable nondisplaced anterior inferior endplate fracture at C5. Hardware intact.  A preliminary report of this exam was discussed in person with Dr. Lisbeth Renshaw by Dr. Warner Mccreedy at [phone] hours on 09/24/2014.   Electronically Signed   By: Odessa Fleming M.D.   On: 09/24/2014 09:11   Dg Chest Port 1 View  10/06/2014   CLINICAL DATA:  Congestive cardiomyopathy  EXAM: PORTABLE CHEST - 1 VIEW  COMPARISON:  10/04/2014  FINDINGS: Feeding tube and tracheostomy  tube stable. Bibasilar atelectasis versus airspace disease stable. Aortic arch and nominal remain prominent. No pneumothorax.  IMPRESSION: Stable bibasilar atelectasis versus airspace disease.   Electronically Signed   By: Jolaine ClickArthur  Hoss M.D.   On: 10/06/2014 07:49   Dg Chest Port 1 View  10/04/2014   CLINICAL DATA:  Respiratory failure .  EXAM: PORTABLE CHEST - 1 VIEW  COMPARISON:  10/02/2014 .  FINDINGS: Tracheostomy tube in stable position. Dobbhoff tube noted with tip in this stomach. Heart size and pulmonary vascularity normal. Bibasilar atelectasis and/or infiltrates are noted. No pleural effusion or pneumothorax. Prior cervical spine fusion.  IMPRESSION: 1. Tracheostomy tube in stable position. Dobbhoff tube noted with tip in the stomach. 2. Bibasilar atelectasis and/or infiltrates.   Electronically Signed   By: Maisie Fushomas  Register   On: 10/04/2014 07:22   Dg Chest Port 1 View  10/02/2014   CLINICAL DATA:  Acute respiratory failure and hypertension.  EXAM: PORTABLE CHEST - 1 VIEW   COMPARISON:  09/30/2014.  FINDINGS: Tracheostomy new in the interval. Feeding tube extends beyond the inferior aspect of the film. Midline trachea. Normal heart size. Atherosclerosis in the transverse aorta. Left costophrenic angle minimally excluded. No pleural effusion or pneumothorax. Hyperinflation. No lobar consolidation. Minimal atelectasis or scarring at the left lung base.  IMPRESSION: Hyperinflation, without acute disease.  Interval placement of tracheostomy, without acute complication.   Electronically Signed   By: Jeronimo GreavesKyle  Talbot M.D.   On: 10/02/2014 09:44   Dg Chest Port 1 View  09/30/2014   CLINICAL DATA:  Hypoxia with re-intubation  EXAM: PORTABLE CHEST - 1 VIEW  COMPARISON:  Study obtained earlier in the day  FINDINGS: Endotracheal tube tip is 7.1 cm above the carina. Feeding tube tip is in the proximal stomach. No pneumothorax. There is consolidation in the medial right base. Lungs otherwise are clear. Heart size and pulmonary vascularity are normal. No adenopathy. There is postoperative change in the lower cervical spine.  IMPRESSION: Tube positions as described without pneumothorax. Consolidation medial right base. Lungs otherwise clear.   Electronically Signed   By: Bretta BangWilliam  Woodruff III M.D.   On: 09/30/2014 11:28   Dg Chest Portable 1 View  09/30/2014   CLINICAL DATA:  Respiratory failure.  Hypertension.  EXAM: PORTABLE CHEST - 1 VIEW  COMPARISON:  09/29/2014  FINDINGS: Endotracheal tube has been removed. Placement of feeding tube and the tip is in the stomach fundus region. There are new or increased patchy densities at the left lung base. Few densities at the right lung base may represent atelectasis. Upper lungs are clear. Heart size is normal. Again noted is a surgical plate and hardware in the lower cervical spine.  IMPRESSION: Increased densities at the left lung base are concerning for developing airspace disease or aspiration. Recommend follow-up to evaluate this area.  Placement of  feeding tube with the tip in the stomach. Removal of endotracheal tube.   Electronically Signed   By: Richarda OverlieAdam  Henn M.D.   On: 09/30/2014 07:46   Dg Chest Port 1 View  09/29/2014   CLINICAL DATA:  Acute respiratory failure.  Intubation.  EXAM: PORTABLE CHEST - 1 VIEW  COMPARISON:  09/29/2014  FINDINGS: A new endotracheal tube is seen just below the level of the clavicles with the tip approximately 5.5 cm above the carina. Previously seen feeding tube is been removed. Cervical spine fusion hardware again noted.  Decreased atelectasis noted in the right lung base. Persistent subsegmental atelectasis seen in the left lower lung,  without significant change. No evidence of pulmonary consolidation or pleural effusion. No evidence of pneumothorax. Heart size remains stable.  IMPRESSION: Endotracheal tube tip approximately 5.5 cm above carina.  Stable left lower lung atelectasis. Decreased right basilar atelectasis.   Electronically Signed   By: Myles Rosenthal M.D.   On: 09/29/2014 13:29   Dg Chest Port 1 View  09/29/2014   CLINICAL DATA:  Aspiration.  EXAM: PORTABLE CHEST - 1 VIEW  COMPARISON:  09/26/2014, 09/24/2014.  FINDINGS: Interim extubation. Interim placement of feeding tube. Its tip is at the gastroesophageal junction. More distal placement suggested. Heart size stable. Persistent bibasilar atelectasis and infiltrates. No pleural effusion or pneumothorax. Prior cervical spine fusion.  IMPRESSION: 1. Interim extubation. Interim placement of feeding tube. Its tip is at the gastroesophageal junction. More distal placement suggested. 2. Persistent bibasilar atelectasis and/or infiltrates. Critical Value/emergent results were called by telephone at the time of interpretation on 09/29/2014 at 7:54 am to nurse Celenia, who verbally acknowledged these results.   Electronically Signed   By: Maisie Fus  Register   On: 09/29/2014 07:57   Dg Chest Port 1 View  09/26/2014   CLINICAL DATA:  Endotracheal tube repositioning.  Subsequent encounter.  EXAM: PORTABLE CHEST - 1 VIEW  COMPARISON:  Chest radiograph performed 09/24/2014  FINDINGS: The patient's endotracheal tube is seen ending 6 cm above the carina.  A small right pleural effusion is suspected. Mild bibasilar atelectasis is noted. No pneumothorax is identified  The cardiomediastinal silhouette is borderline normal in size, with mild stable prominence of the superior mediastinum. No acute osseous abnormalities are seen. The patient is status post cervical spinal fusion.  IMPRESSION: 1. Endotracheal tube seen ending 6 cm above the carina. 2. Small right pleural effusion suspected. Mild bibasilar atelectasis noted.   Electronically Signed   By: Roanna Raider M.D.   On: 09/26/2014 01:38   Dg Chest Port 1 View  09/24/2014   CLINICAL DATA:  Endotracheal tube placement.  Initial encounter.  EXAM: PORTABLE CHEST - 1 VIEW  COMPARISON:  Chest radiograph performed 04/07/2014  FINDINGS: The patient's endotracheal tube is seen ending 6 cm above the carina.  Lung expansion is decreased. Bibasilar airspace opacities and increased interstitial markings on the right may reflect pulmonary edema or postoperative atelectasis. Would assess for any signs of pneumonia. No pleural effusion or pneumothorax is seen.  The cardiomediastinal silhouette is normal in size. No acute osseous abnormalities are identified. An external pacing pad is noted. Cervical spinal fusion hardware is noted.  IMPRESSION: 1. Endotracheal tube seen ending 6 cm above the carina. 2. Lung expansion is decreased. Bibasilar airspace opacities and increased interstitial markings on the right may reflect pulmonary edema or postoperative atelectasis. Would assess clinically for any signs of pneumonia.   Electronically Signed   By: Roanna Raider M.D.   On: 09/24/2014 08:23   Dg Abd Portable 1v  10/05/2014   CLINICAL DATA:  Verify feeding tube placement  EXAM: PORTABLE ABDOMEN - 1 VIEW  COMPARISON:  09/29/2014  FINDINGS: Feeding  tube tip projects over the gastric fundus.  The visible bowel gas pattern is nonobstructive. The lung bases are grossly clear. Cholecystectomy.  IMPRESSION: Feeding tube tip over the gastric fundus.   Electronically Signed   By: Marnee Spring M.D.   On: 10/05/2014 19:26   Dg Abd Portable 1v  09/29/2014   CLINICAL DATA:  Feeding tube placement.  EXAM: PORTABLE ABDOMEN - 1 VIEW  COMPARISON:  Radiograph dated 09/27/2014  FINDINGS: Feeding tube  tip is in the fundus of the stomach. Visualized bowel gas pattern is normal. Endotracheal tube is at the thoracic inlet. Visualized portions of the lungs are clear.  IMPRESSION: Feeding tube tip in the fundus of the stomach.   Electronically Signed   By: Francene Boyers M.D.   On: 09/29/2014 17:10   Dg Abd Portable 1v  09/27/2014   CLINICAL DATA:  Nasogastric tube placement.  EXAM: PORTABLE ABDOMEN - 1 VIEW  COMPARISON:  None.  FINDINGS: The bowel gas pattern is normal. No radio-opaque calculi or other significant radiographic abnormality are seen.  Panda Tube lies along the greater curvature of the stomach near the fundus. It needs to be advanced several cm to lie distal to the pylorus.  IMPRESSION: Panda tube lies along the greater curvature the stomach.   Electronically Signed   By: Davonna Belling M.D.   On: 09/27/2014 13:16   Dg C-arm 1-60 Min  09/23/2014   CLINICAL DATA:  ACDF, C3-4, C4-5, C5-6, and C6-7.  EXAM: DG C-ARM 61-120 MIN; CERVICAL SPINE - 2-3 VIEW  : COMPARISON:  Cervical spine MRI 01/14/2014  FINDINGS: Discectomy at C3-4, C4-5, C5-6, and C6-7. There is a ventral plate and screw fixation which is in good position. The intervertebral grafts are well located. There is no appreciable endplate fracture. Open wound with packing. Endotracheal and orogastric tubes.  IMPRESSION: C3-4, C4-5, C5-6, C6-7 ACDF with plate.  No adverse findings.   Electronically Signed   By: Marnee Spring M.D.   On: 09/23/2014 12:29    Labs:  CBC:  Recent Labs  10/03/14 0345  10/04/14 0440 10/05/14 0225 10/06/14 0849  WBC 12.3* 12.4* 12.7* 10.7*  HGB 10.6* 10.0* 11.2* 11.1*  HCT 32.9* 32.0* 34.8* 33.7*  PLT 285 265 275 300    COAGS:  Recent Labs  10/06/14 1805  INR 1.25  APTT 32    BMP:  Recent Labs  10/03/14 0345 10/04/14 0440 10/05/14 0225 10/06/14 0849  NA 147* 147* 145 142  K 3.7 3.7 3.4* 3.6  CL 109 110 107 106  CO2 28 34* 25 23  GLUCOSE 178* 186* 132* 183*  BUN 27* 29* 25* 28*  CALCIUM 8.7 8.3* 8.5 8.5  CREATININE 0.87 0.90 0.69 0.69  GFRNONAA 80* 79* 88* 88*  GFRAA >90 >90 >90 >90    LIVER FUNCTION TESTS:  Recent Labs  10/06/14 0849  BILITOT 0.7  AST 19  ALT 30  ALKPHOS 51  PROT 7.5  ALBUMIN 1.6*    TUMOR MARKERS: No results for input(s): AFPTM, CEA, CA199, CHROMGRNA in the last 8760 hours.  Assessment and Plan: Pt with resp failure, HCAP, trach, vent assoc dysphagia, s/p ACDF with subsequent rt neck hematoma.Imaging studies were reviewed by Dr. Bonnielee Haff and pt is candidate for G tube placement. Risks and benefits discussed with the patient's family including, but not limited to the need for a barium enema during the procedure, bleeding, infection, peritonitis, or damage to adjacent structures.All of the patient's questions were answered, patient is agreeable to proceed. Consent signed and in chart. Will check f/u CXR/labs in am and if stable/ pt afebrile plan for case on either 4/11 or 4/12.       Signed: Chinita Pester 10/09/2014, 4:40 PM   I spent a total of 20  minutes in face to face in clinical consultation, greater than 50% of which was counseling/coordinating care for perc gastrostomy tube placement

## 2014-10-10 ENCOUNTER — Other Ambulatory Visit (HOSPITAL_COMMUNITY): Payer: Self-pay

## 2014-10-10 DIAGNOSIS — Z930 Tracheostomy status: Secondary | ICD-10-CM

## 2014-10-10 DIAGNOSIS — J9600 Acute respiratory failure, unspecified whether with hypoxia or hypercapnia: Secondary | ICD-10-CM

## 2014-10-10 DIAGNOSIS — R5381 Other malaise: Secondary | ICD-10-CM

## 2014-10-10 DIAGNOSIS — J690 Pneumonitis due to inhalation of food and vomit: Secondary | ICD-10-CM

## 2014-10-10 LAB — CBC
HEMATOCRIT: 34.6 % — AB (ref 39.0–52.0)
Hemoglobin: 11.2 g/dL — ABNORMAL LOW (ref 13.0–17.0)
MCH: 29.3 pg (ref 26.0–34.0)
MCHC: 32.4 g/dL (ref 30.0–36.0)
MCV: 90.6 fL (ref 78.0–100.0)
Platelets: 338 10*3/uL (ref 150–400)
RBC: 3.82 MIL/uL — ABNORMAL LOW (ref 4.22–5.81)
RDW: 13.5 % (ref 11.5–15.5)
WBC: 12.7 10*3/uL — ABNORMAL HIGH (ref 4.0–10.5)

## 2014-10-10 LAB — BASIC METABOLIC PANEL
ANION GAP: 6 (ref 5–15)
BUN: 32 mg/dL — ABNORMAL HIGH (ref 6–23)
CO2: 25 mmol/L (ref 19–32)
CREATININE: 0.7 mg/dL (ref 0.50–1.35)
Calcium: 8.7 mg/dL (ref 8.4–10.5)
Chloride: 103 mmol/L (ref 96–112)
GFR calc Af Amer: >90 mL/min (ref >90)
GFR calc non Af Amer: 87 mL/min — ABNORMAL LOW (ref >90)
Glucose, Bld: 182 mg/dL — ABNORMAL HIGH (ref 70–99)
POTASSIUM: 3.7 mmol/L (ref 3.5–5.1)
Sodium: 134 mmol/L — ABNORMAL LOW (ref 135–145)

## 2014-10-10 LAB — APTT: APTT: 33 s (ref 24–37)

## 2014-10-10 LAB — PROTIME-INR
INR: 1.19 (ref 0.00–1.49)
Prothrombin Time: 15.3 seconds — ABNORMAL HIGH (ref 11.6–15.2)

## 2014-10-10 MED ORDER — CEFAZOLIN SODIUM-DEXTROSE 2-3 GM-% IV SOLR
2.0000 g | Freq: Once | INTRAVENOUS | Status: AC
  Administered 2014-10-11: 2 g via INTRAVENOUS

## 2014-10-10 NOTE — Consult Note (Signed)
Name: Ricardo Rivera MRN: [id number] DOB: 07-09-1932    ADMISSION DATE:  10/05/2014 CONSULTATION DATE:  10/10/14  REFERRING MD :  Dr. Sharyon Medicus   CHIEF COMPLAINT:  Respiratory Failure   BRIEF PATIENT DESCRIPTION: 81 y/o M with PMH of cervical spondylosis with myelopathy who underwent anterior cervical diskectomy and fusion of C3-C7 on 3/26.  Post-operative course complicated by respiratory decompensation due to swelling and required emergent intubation.  Self-extubated 3/31 but unable to clear secretions and reintubated.  Underwent tracheostomy on 4/2 (ENT).  Tx to [facility] 4/6 for further vent weaning / rehab efforts.   SIGNIFICANT EVENTS  3/25 Admit for planned ACDF in setting of R sided hand / arm pain  3/26 Respiratory decompensation on floor, intubated per anesthesia  3/31  Self extubated 4/01  Reintubated, unable to clear secretions 4/02  Trach per ENT (Dr. Lazarus Salines) 4/05  Tx to SDU 4/06  To [facility] for rehab    4/11  PCCM consulted for poor weaning efforts, concerns for LL PNA  STUDIES:     HISTORY OF PRESENT ILLNESS:  81 y/o M with PMH of cervical spondylosis with myelopathy who underwent anterior cervical diskectomy and fusion of C3-C7 on 3/26.  Post-operative course complicated by respiratory decompensation due to swelling and required emergent intubation.  Self-extubated 3/31 but unable to clear secretions and reintubated.  Underwent tracheostomy on 4/2 (ENT).  Tx to [facility] 4/6 for further vent weaning / rehab efforts.    PAST MEDICAL HISTORY :   has a past medical history of Hypertension; Shortness of breath dyspnea; Arthritis; Hyperlipemia; Frequent urination; and Nocturia.  has past surgical history that includes spinal surgery; Spinal fusion (1986); Shoulder surgies (Bilateral); Lower back (1970); Cholecystectomy (1998); Anterior cervical decompression/discectomy fusion 4 level (N/A, 09/23/2014); Tracheostomy tube placement (N/A, 10/01/2014); and Direct laryngoscopy (N/A, 10/01/2014).     Prior to Admission medications   Medication Sig Start Date End Date Taking? Authorizing Provider  acetaminophen (TYLENOL) 500 MG tablet Take 500 mg by mouth every 6 (six) hours as needed for mild pain or moderate pain.     Historical Provider, MD  ALPRAZolam Prudy Feeler) 0.5 MG tablet Take 0.5 mg by mouth at bedtime as needed for sleep.     Historical Provider, MD  aspirin 81 MG tablet Take 81 mg by mouth daily.      Historical Provider, MD  brimonidine (ALPHAGAN P) 0.1 % SOLN Place 1 drop into both eyes 3 (three) times daily.     Historical Provider, MD  chlorhexidine (PERIDEX) 0.12 % solution 15 mLs by Mouth Rinse route 2 (two) times daily. 10/05/14   Lisbeth Renshaw, MD  Cholecalciferol (VITAMIN D) 2000 UNITS CAPS Take 2,000 Units by mouth daily.     Historical Provider, MD  diazepam (VALIUM) 5 MG tablet Take 1 tablet (5 mg total) by mouth every 6 (six) hours as needed for muscle spasms. 10/05/14   Lisbeth Renshaw, MD  docusate sodium (COLACE) 100 MG capsule Take 1 capsule (100 mg total) by mouth daily as needed for mild constipation. 10/05/14   Lisbeth Renshaw, MD  gabapentin (NEURONTIN) 300 MG capsule Take 1 capsule (300 mg total) by mouth at bedtime. Patient not taking: Reported on 09/01/2014 01/01/14   Nelva Nay, MD  gemfibrozil (LOPID) 600 MG tablet Take 600 mg by mouth 2 (two) times daily before a meal.      Historical Provider, MD  heparin 5000 UNIT/ML injection Inject 1 mL (5,000 Units total) into the skin every 8 (eight) hours. 10/05/14  Lisbeth RenshawNeelesh Nundkumar, MD  latanoprost (XALATAN) 0.005 % ophthalmic solution Place 1 drop into both eyes at bedtime.    Historical Provider, MD  losartan (COZAAR) 50 MG tablet Take 50 mg by mouth daily.      Historical Provider, MD  methylPREDNIsolone (MEDROL DOSPACK) 4 MG tablet follow package directions 10/05/14   Lisbeth RenshawNeelesh Nundkumar, MD  metoprolol (LOPRESSOR) 1 MG/ML injection Inject 2.5-5 mLs (2.5-5 mg total) into the vein every 3 (three) hours as needed.  10/05/14   Lisbeth RenshawNeelesh Nundkumar, MD  Multiple Vitamins-Minerals (CENTRUM SILVER ADULT 50+ PO) Take 1 tablet by mouth daily.    Historical Provider, MD  Nutritional Supplements (FEEDING SUPPLEMENT, VITAL AF 1.2 CAL,) LIQD Place 1,000 mLs into feeding tube continuous. 10/05/14   Lisbeth RenshawNeelesh Nundkumar, MD  oxybutynin (DITROPAN) 5 MG tablet Take 5 mg by mouth daily.    Historical Provider, MD  pantoprazole (PROTONIX) 40 MG tablet Take 40 mg by mouth daily.    Historical Provider, MD  senna (SENOKOT) 8.6 MG TABS tablet Take 1 tablet (8.6 mg total) by mouth daily as needed for mild constipation. 10/05/14   Lisbeth RenshawNeelesh Nundkumar, MD  Water For Irrigation, Sterile (FREE WATER) SOLN Place 100 mLs into feeding tube every 8 (eight) hours. 10/05/14   Lisbeth RenshawNeelesh Nundkumar, MD   Allergies  Allergen Reactions  . Flexeril [Cyclobenzaprine] Other (See Comments)    Hallucinations  . Tramadol Other (See Comments)    Hallunications    FAMILY HISTORY:  family history is not on file.   SOCIAL HISTORY:  reports that he has been smoking.  He has never used smokeless tobacco. He reports that he does not drink alcohol or use illicit drugs.  REVIEW OF SYSTEMS:    SUBJECTIVE:   VITAL SIGNS:  Reviewed at bedside.   PHYSICAL EXAMINATION: General:  Chronically ill in NAD on vent  Neuro:  Sedate, spontaneous movement noted HEENT:  #6 trach midline c/d/i, feeding tube in nose, C-collar in place Cardiovascular:  s1s2 rrr, no m/r/g Lungs:  resp's even/non-labored, lungs bilaterally coarse rhonchi  Abdomen:  Obese/soft, bsx4 active  Musculoskeletal:  No acute deformities Skin:  Warm/dry, no edema   Recent Labs Lab 10/05/14 0225 10/06/14 0849 10/10/14 0530  NA 145 142 134*  K 3.4* 3.6 3.7  CL 107 106 103  CO2 25 23 25   BUN 25* 28* 32*  CREATININE 0.69 0.69 0.70  GLUCOSE 132* 183* 182*    Recent Labs Lab 10/05/14 0225 10/06/14 0849 10/10/14 0530  HGB 11.2* 11.1* 11.2*  HCT 34.8* 33.7* 34.6*  WBC 12.7* 10.7* 12.7*    PLT 275 300 338   Dg Chest Port 1 View  10/10/2014   CLINICAL DATA:  Respiratory failure.  Follow-up.  EXAM: PORTABLE CHEST - 1 VIEW  COMPARISON:  10/06/2014, 10/04/2014, 10/02/2014.  FINDINGS: Soft feeding the tube enters the abdomen. Tracheostomy remains in place. Heart size is normal. Mediastinal shadows are otherwise unremarkable. Patchy infiltrate persists in both lower lobes consistent with bronchopneumonia. Upper lungs are clear. No effusions. No lobar collapse.  IMPRESSION: Persistent bronchopneumonia in both lower lobes. No radiographic change.   Electronically Signed   By: Paulina FusiMark  Shogry M.D.   On: 10/10/2014 08:12    ASSESSMENT / PLAN:    Ventilator Dependent Respiratory Failure  Bilateral lower airspace disease R>L - as noted on CT and CXR, concerning for aspiration PNA Tracheostomy Status  Hx emergent intubation with airway swelling s/p ACDF Cervical spondylosis with myelopathy s/p ACDF (3/26)  Plan: Zosyn for aspiration PNA, defer  duration to primary SVC Vent wean protocol, may not be able to tolerate until PNA cleared Trend CXR intermittently  Assess PCT daily x3  No decannulation until C-Collar is cleared for removal by Neurosurgery  Mobilize as deemed safe per NSGY Suctioning PRN PPI to ensure no reflux contribution to aspiration   Canary Brim, NP-C [facility] Pgr: [phone] or [phone]  I reviewed CXR and CT myself, R>L disease.  Respiratory failure post ACDF with airway swelling and concern for inability to protect airway.  Trach in place with a  c-collar.  Patient was able to tolerate weaning relatively well.  However, now with fluid overload and overall deconditioning as well as a PNA the patient is unable to wean.    Problem list: - HCAP vs aspiration PNA. - Severe deconditioning. - Acute respiratory failure. - Tracheostomy status. - Ventilator dependence.  Recommendations. - Agree with zosyn for pneumonia treatment.   - Suspend  weaning until pneumonia clear, may proceed with PS trials only but hold off TC for now. - Check PCT as above. - No decannulation as long as C-collar is on. - Rehab as able. - Will follow.  The patient is critically ill with multiple organ systems failure and requires high complexity decision making for assessment and support, frequent evaluation and titration of therapies, application of advanced monitoring technologies and extensive interpretation of multiple databases.   Critical Care Time devoted to patient care services described in this note is  35  Minutes. This time reflects time of care of this signee Dr Koren Bound. This critical care time does not reflect procedure time, or teaching time or supervisory time of PA/NP/Med student/Med Resident etc but could involve care discussion time.  Alyson Reedy, M.D. [facility] Pulmonary/Critical Care Medicine. Pager: [phone]. After hours pager: [phone].  10/10/2014, 3:39 PM

## 2014-10-11 ENCOUNTER — Other Ambulatory Visit (HOSPITAL_COMMUNITY): Payer: Self-pay

## 2014-10-11 LAB — CBC
HCT: 32.6 % — ABNORMAL LOW (ref 39.0–52.0)
Hemoglobin: 10.9 g/dL — ABNORMAL LOW (ref 13.0–17.0)
MCH: 29.5 pg (ref 26.0–34.0)
MCHC: 33.4 g/dL (ref 30.0–36.0)
MCV: 88.3 fL (ref 78.0–100.0)
PLATELETS: 306 10*3/uL (ref 150–400)
RBC: 3.69 MIL/uL — ABNORMAL LOW (ref 4.22–5.81)
RDW: 13.4 % (ref 11.5–15.5)
WBC: 11.9 10*3/uL — ABNORMAL HIGH (ref 4.0–10.5)

## 2014-10-11 LAB — BASIC METABOLIC PANEL
ANION GAP: 7 (ref 5–15)
BUN: 27 mg/dL — ABNORMAL HIGH (ref 6–23)
CALCIUM: 8.6 mg/dL (ref 8.4–10.5)
CO2: 27 mmol/L (ref 19–32)
CREATININE: 0.64 mg/dL (ref 0.50–1.35)
Chloride: 100 mmol/L (ref 96–112)
GFR calc Af Amer: >90 mL/min (ref >90)
GFR calc non Af Amer: >90 mL/min (ref >90)
GLUCOSE: 122 mg/dL — AB (ref 70–99)
Potassium: 4.3 mmol/L (ref 3.5–5.1)
Sodium: 134 mmol/L — ABNORMAL LOW (ref 135–145)

## 2014-10-11 MED ORDER — IOHEXOL 300 MG/ML  SOLN
50.0000 mL | Freq: Once | INTRAMUSCULAR | Status: AC | PRN
  Administered 2014-10-11: 25 mL

## 2014-10-11 MED ORDER — FENTANYL CITRATE 0.05 MG/ML IJ SOLN
INTRAMUSCULAR | Status: AC | PRN
  Administered 2014-10-11: 50 ug via INTRAVENOUS

## 2014-10-11 MED ORDER — GLUCAGON HCL (RDNA) 1 MG IJ SOLR
INTRAMUSCULAR | Status: AC | PRN
  Administered 2014-10-11: 1 mg via INTRAVENOUS

## 2014-10-11 NOTE — Procedures (Signed)
Successful fluoroscopic guided insertion of gastrostomy tube without immediate post procedural complicatoin.   The gastrostomy tube may be used immediately for medications.  Tube feeds may be initiated in 24 hours as per the primary team.   

## 2014-10-13 DIAGNOSIS — J9620 Acute and chronic respiratory failure, unspecified whether with hypoxia or hypercapnia: Secondary | ICD-10-CM

## 2014-10-13 NOTE — Progress Notes (Signed)
Name: Ricardo AsalDwight L Rivera MRN: [id number] DOB: 08/17/1932    ADMISSION DATE:  10/05/2014 CONSULTATION DATE:  10/10/14  REFERRING MD :  Dr. Sharyon MedicusHijazi   CHIEF COMPLAINT:  Respiratory Failure   BRIEF PATIENT DESCRIPTION: 81 y/o M with PMH of cervical spondylosis with myelopathy who underwent anterior cervical diskectomy and fusion of C3-C7 on 3/26.  Post-operative course complicated by respiratory decompensation due to swelling and required emergent intubation.  Self-extubated 3/31 but unable to clear secretions and reintubated.  Underwent tracheostomy on 4/2 (ENT).  [facility] 4[facility] for further vent weaning / rehab efforts.   SIGNIFICANT EVENTS  3/25 Admit for planned ACDF in setting of R sided hand / arm pain  3/26 Respiratory decompensation on floor, intubated per anesthesia  3/31  Self extubated 4/01  Reintubated, unable to clear secretions 4/02  Trach per ENT (Dr. Lazarus SalinesWolicki) 4/05  Tx to SDU 4/06  [facility]    4/11  PCCM consulted for poor weaning efforts, concerns for LL PNA 4/12 peg by IR>>  STUDIES:     HISTORY OF PRESENT ILLNESS:  81 y/o M with PMH of cervical spondylosis with myelopathy who underwent anterior cervical diskectomy and fusion of C3-C7 on 3/26.  Post-operative course complicated by respiratory decompensation due to swelling and required emergent intubation.  Self-extubated 3/31 but unable to clear secretions and reintubated.  Underwent tracheostomy on 4/2 (ENT).  [facility] we[facility] / rehab efforts.   SUBJECTIVE:   VITAL SIGNS: Vital signs reviewed. Abnormal values will appear under impression plan section.   PHYSICAL EXAMINATION: General:  Chronically ill in NAD on vent  Neuro:  Sedate, spontaneous movement noted HEENT:  #6 trach midline c/d/i, feeding tube in nose, C-collar in place Cardiovascular:  s1s2 rrr, no m/r/g Lungs:  resp's even/non-labored, lungs bilaterally coarse rhonchi  Abdomen:  Obese/soft, bsx4 active . Peg  noted Musculoskeletal:  No acute deformities Skin:  Warm/dry, no edema   Recent Labs Lab 10/06/14 0849 10/10/14 0530 10/11/14 0715  NA 142 134* 134*  K 3.6 3.7 4.3  CL 106 103 100  CO2 23 25 27   BUN 28* 32* 27*  CREATININE 0.69 0.70 0.64  GLUCOSE 183* 182* 122*    Recent Labs Lab 10/06/14 0849 10/10/14 0530 10/11/14 0715  HGB 11.1* 11.2* 10.9*  HCT 33.7* 34.6* 32.6*  WBC 10.7* 12.7* 11.9*  PLT 300 338 306   Ir Gastrostomy Tube Mod Sed  10/11/2014   INDICATION: History of respiratory arrest, tracheostomy tube placement, hospital acquired pneumonia and tracheostomy associated dysphagia. Request made for percutaneous placement of a gastrostomy tube for enteric nutrition supplementation.  EXAM: PULL TROUGH GASTROSTOMY TUBE PLACEMENT  COMPARISON:  CT abdomen pelvis - 10/08/2014  MEDICATIONS: The patient is currently admitted to the hospital and receiving intravenous antibiotics.  Antibiotics were administered within 1 hour of the procedure.  CONTRAST:  20 mL of Isovue 300 administered into the gastric lumen.  ANESTHESIA/SEDATION: Fentanyl 100 mcg IV  Sedation time  15 minutes  FLUOROSCOPY TIME:  5 minutes 18 seconds (169.4 mGy).  COMPLICATIONS: None immediate  PROCEDURE: Informed written consent was obtained from the patient's family following explanation of the procedure, risks, benefits and alternatives. A time out was performed prior to the initiation of the procedure. Ultrasound scanning was performed to demarcate the edge of the left lobe of the liver. Maximal barrier sterile technique utilized including caps, mask, sterile gowns, sterile gloves, large sterile drape, hand hygiene and Betadine prep.  The left upper quadrant was sterilely prepped and draped. An oral gastric catheter was inserted into the stomach under fluoroscopy. The existing nasogastric feeding tube was removed. The left costal margin and air opacified transverse colon were identified and avoided. Air was injected into  the stomach for insufflation and visualization under fluoroscopy. Under sterile conditions a 17 gauge trocar needle was utilized to access the stomach percutaneously beneath the left subcostal margin after the overlying soft tissues were anesthetized with 1% Lidocaine with epinephrine. Needle position was confirmed within the stomach with aspiration of air and injection of small amount of contrast. A single T tack was deployed for gastropexy. Over an Amplatz guide wire, a 9-French sheath was inserted into the stomach. A snare device was utilized to capture the oral gastric catheter. The snare device was pulled retrograde from the stomach up the esophagus and out the oropharynx. The 20-French pull-through gastrostomy was connected to the snare device and pulled antegrade through the oropharynx down the esophagus into the stomach and then through the percutaneous tract external to the patient. The gastrostomy was assembled externally. Contrast injection confirms position in the stomach. Several spot radiographic images were obtained in various obliquities for documentation. The patient tolerated procedure well without immediate post procedural complication.  FINDINGS: After successful fluoroscopic guided placement, the gastrostomy tube is appropriately positioned with internal disc against the ventral aspect of the gastric lumen.  IMPRESSION: Successful fluoroscopic insertion of a 20-French pull-through gastrostomy tube.  The gastrostomy may be used immediately for medication administration and in 24 hrs for the initiation of feeds.   Electronically Signed   By: Simonne Come M.D.   On: 10/11/2014 16:22    ASSESSMENT / PLAN:  Ventilator Dependent Respiratory Failure  Bilateral lower airspace disease R>L - as noted on CT and CXR, concerning for aspiration PNA Tracheostomy Status  Hx emergent intubation with airway swelling s/p ACDF Cervical spondylosis with myelopathy s/p ACDF (3/26)  Plan: Zosyn for  aspiration PNA, defer duration to primary SVC Vent wean protocol, may not be able to tolerate until PNA cleared Trend CXR intermittently  No decannulation until C-Collar is cleared for removal by Neurosurgery  Mobilize as deemed safe per NSGY Suctioning PRN PPI to ensure no reflux contribution to aspiration   Brett Canales Minor ACNP [facility] PCCM Pager [phone] till 3 pm If no answer page [phone] 10/13/2014, 8:47 AM  Attending Note:  I have examined patient, reviewed labs, studies and notes. I have discussed the case with S Minor, and I agree with the data and plans as amended above.   Levy Pupa, MD, PhD 10/13/2014, 11:14 AM [place] [facility] [phone] or if no answer [phone]

## 2014-10-17 ENCOUNTER — Encounter (HOSPITAL_COMMUNITY): Payer: Self-pay | Admitting: Neurosurgery

## 2014-10-17 ENCOUNTER — Other Ambulatory Visit (HOSPITAL_COMMUNITY): Payer: Self-pay

## 2014-10-17 DIAGNOSIS — J810 Acute pulmonary edema: Secondary | ICD-10-CM

## 2014-10-17 DIAGNOSIS — J9811 Atelectasis: Secondary | ICD-10-CM

## 2014-10-17 LAB — CBC WITH DIFFERENTIAL/PLATELET
Basophils Absolute: 0 10*3/uL (ref 0.0–0.1)
Basophils Relative: 0 % (ref 0–1)
Eosinophils Absolute: 0.3 10*3/uL (ref 0.0–0.7)
Eosinophils Relative: 3 % (ref 0–5)
HCT: 31.8 % — ABNORMAL LOW (ref 39.0–52.0)
HEMOGLOBIN: 10.4 g/dL — AB (ref 13.0–17.0)
Lymphocytes Relative: 37 % (ref 12–46)
Lymphs Abs: 3.8 10*3/uL (ref 0.7–4.0)
MCH: 29.5 pg (ref 26.0–34.0)
MCHC: 32.7 g/dL (ref 30.0–36.0)
MCV: 90.1 fL (ref 78.0–100.0)
MONOS PCT: 6 % (ref 3–12)
Monocytes Absolute: 0.6 10*3/uL (ref 0.1–1.0)
NEUTROS PCT: 54 % (ref 43–77)
Neutro Abs: 5.6 10*3/uL (ref 1.7–7.7)
Platelets: 269 10*3/uL (ref 150–400)
RBC: 3.53 MIL/uL — AB (ref 4.22–5.81)
RDW: 14 % (ref 11.5–15.5)
WBC: 10.3 10*3/uL (ref 4.0–10.5)

## 2014-10-17 LAB — MAGNESIUM: Magnesium: 2 mg/dL (ref 1.5–2.5)

## 2014-10-17 LAB — BASIC METABOLIC PANEL
Anion gap: 12 (ref 5–15)
BUN: 27 mg/dL — ABNORMAL HIGH (ref 6–23)
CALCIUM: 9 mg/dL (ref 8.4–10.5)
CO2: 27 mmol/L (ref 19–32)
CREATININE: 0.7 mg/dL (ref 0.50–1.35)
Chloride: 98 mmol/L (ref 96–112)
GFR, EST NON AFRICAN AMERICAN: 87 mL/min — AB (ref >90)
Glucose, Bld: 112 mg/dL — ABNORMAL HIGH (ref 70–99)
Potassium: 4 mmol/L (ref 3.5–5.1)
Sodium: 137 mmol/L (ref 135–145)

## 2014-10-17 NOTE — Progress Notes (Signed)
Name: Ricardo Rivera MRN: [id number] DOB: August 23, 1932    ADMISSION DATE:  10/05/2014 CONSULTATION DATE:  10/10/14  REFERRING MD :  Dr. Sharyon Medicus   CHIEF COMPLAINT:  Respiratory Failure   BRIEF PATIENT DESCRIPTION: 81 y/o M with PMH of cervical spondylosis with myelopathy who underwent anterior cervical diskectomy and fusion of C3-C7 on 3/26.  Post-operative course complicated by respiratory decompensation due to swelling and required emergent intubation.  Self-extubated 3/31 but unable to clear secretions and reintubated.  Underwent tracheostomy on 4/2 (ENT).  Tx to [facility] 4/6 for further vent weaning / rehab efforts.   SIGNIFICANT EVENTS  3/25 Admit for planned ACDF in setting of R sided hand / arm pain  3/26 Respiratory decompensation on floor, intubated per anesthesia  3/31  Self extubated 4/01  Reintubated, unable to clear secretions 4/02  Trach per ENT (Dr. Lazarus Salines) 4/05  Tx to SDU 4/06  To [facility] for rehab    4/11  PCCM consulted for poor weaning efforts, concerns for LL PNA 4/12  PEG by IR   STUDIES:    SUBJECTIVE: No acute events.  Tolerated ATC 10 hours on 4/17  VITAL SIGNS: Vital signs reviewed. Abnormal values will appear under impression plan section.   PHYSICAL EXAMINATION: General:  Chronically ill in NAD on ATC  Neuro:  Eyes open, tracks, moves spontaneously HEENT:  #6 trach midline c/d/i, C-collar in place Cardiovascular:  s1s2 rrr, no m/r/g Lungs:  resp's even/non-labored, lungs bilaterally coarse rhonchi  Abdomen:  Obese/soft, bsx4 active . Peg noted Musculoskeletal:  No acute deformities Skin:  Warm/dry, no edema   Recent Labs Lab 10/11/14 0715 10/17/14 0621  NA 134* 137  K 4.3 4.0  CL 100 98  CO2 27 27  BUN 27* 27*  CREATININE 0.64 0.70  GLUCOSE 122* 112*    Recent Labs Lab 10/11/14 0715 10/17/14 0621  HGB 10.9* 10.4*  HCT 32.6* 31.8*  WBC 11.9* 10.3  PLT 306 269   Dg Abd Portable 1v  10/17/2014   CLINICAL DATA:  Abdominal distension   EXAM: PORTABLE ABDOMEN - 1 VIEW  COMPARISON:  10/05/2014  FINDINGS: A new gastrostomy catheter is noted in place. Fecal material and contrast are noted scattered throughout the colon. No obstructive changes are seen. No free air is noted.  IMPRESSION: Gastrostomy catheter new from the prior exam. No other focal abnormality is seen.   Electronically Signed   By: Alcide Clever M.D.   On: 10/17/2014 10:32    ASSESSMENT / PLAN:  Ventilator Dependent Respiratory Failure  Bilateral lower airspace disease R>L - as noted on CT and CXR, concerning for aspiration PNA Tracheostomy Status  Hx emergent intubation with airway swelling s/p ACDF Cervical spondylosis with myelopathy s/p ACDF (3/26)  Plan: Abx per primary SVC Vent wean protocol >> ATC as tolerated Trend CXR intermittently  No decannulation until C-Collar is cleared for removal by Neurosurgery  Mobilize as deemed safe per NSGY Suctioning PRN PPI to ensure no reflux contribution to aspiration    PCCM will see every Monday.  Please call sooner if needs arise.   Canary Brim, NP-C [place] [facility] Pgr: [phone] or [phone] 10/17/2014, 12:10 PM  I reviewed CXR myself trach in place, pulmonary vascular congestion and atelectasis.  O2 sat of 92% on 28% TC.  Respiratory failure persistent: able to tolerate some weaning but deteriorates after a few hours, currently on TC, will push for 8 hours today.  Tracheostomy status: C-collar on, no decannulation until c-collar  can be removed as patient will be a very high aspiration risk.  Bibasilar atelectasis: Mobilize as able, IS as able, will repeat CXR and suctioning of secretion.  Patient seen and examined, agree with above note.  I dictated the care and orders written for this patient under my direction.  Alyson ReedyWesam G Valor Quaintance, MD [phone]

## 2014-10-22 ENCOUNTER — Other Ambulatory Visit (HOSPITAL_COMMUNITY): Payer: Self-pay

## 2014-10-22 LAB — BASIC METABOLIC PANEL
Anion gap: 9 (ref 5–15)
BUN: 15 mg/dL (ref 6–23)
CHLORIDE: 98 mmol/L (ref 96–112)
CO2: 26 mmol/L (ref 19–32)
Calcium: 8.9 mg/dL (ref 8.4–10.5)
Creatinine, Ser: 0.51 mg/dL (ref 0.50–1.35)
GFR calc Af Amer: >90 mL/min (ref >90)
GFR calc non Af Amer: >90 mL/min (ref >90)
Glucose, Bld: 138 mg/dL — ABNORMAL HIGH (ref 70–99)
Potassium: 3.5 mmol/L (ref 3.5–5.1)
Sodium: 133 mmol/L — ABNORMAL LOW (ref 135–145)

## 2014-10-22 LAB — CBC
HCT: 31.7 % — ABNORMAL LOW (ref 39.0–52.0)
Hemoglobin: 10.7 g/dL — ABNORMAL LOW (ref 13.0–17.0)
MCH: 29.5 pg (ref 26.0–34.0)
MCHC: 33.8 g/dL (ref 30.0–36.0)
MCV: 87.3 fL (ref 78.0–100.0)
Platelets: 189 10*3/uL (ref 150–400)
RBC: 3.63 MIL/uL — AB (ref 4.22–5.81)
RDW: 13.9 % (ref 11.5–15.5)
WBC: 7.5 10*3/uL (ref 4.0–10.5)

## 2014-10-28 LAB — BASIC METABOLIC PANEL
Anion gap: 10 (ref 5–15)
BUN: 17 mg/dL (ref 6–23)
CALCIUM: 9 mg/dL (ref 8.4–10.5)
CO2: 26 mmol/L (ref 19–32)
CREATININE: 0.54 mg/dL (ref 0.50–1.35)
Chloride: 96 mmol/L (ref 96–112)
GFR calc Af Amer: >90 mL/min (ref >90)
Glucose, Bld: 108 mg/dL — ABNORMAL HIGH (ref 70–99)
Potassium: 4 mmol/L (ref 3.5–5.1)
SODIUM: 132 mmol/L — AB (ref 135–145)

## 2014-10-28 LAB — CBC WITH DIFFERENTIAL/PLATELET
BASOS PCT: 0 % (ref 0–1)
Basophils Absolute: 0 10*3/uL (ref 0.0–0.1)
EOS ABS: 0.3 10*3/uL (ref 0.0–0.7)
Eosinophils Relative: 4 % (ref 0–5)
HCT: 30.5 % — ABNORMAL LOW (ref 39.0–52.0)
HEMOGLOBIN: 10.4 g/dL — AB (ref 13.0–17.0)
Lymphocytes Relative: 40 % (ref 12–46)
Lymphs Abs: 3.3 10*3/uL (ref 0.7–4.0)
MCH: 29.6 pg (ref 26.0–34.0)
MCHC: 34.1 g/dL (ref 30.0–36.0)
MCV: 86.9 fL (ref 78.0–100.0)
Monocytes Absolute: 0.7 10*3/uL (ref 0.1–1.0)
Monocytes Relative: 9 % (ref 3–12)
Neutro Abs: 3.9 10*3/uL (ref 1.7–7.7)
Neutrophils Relative %: 47 % (ref 43–77)
PLATELETS: 223 10*3/uL (ref 150–400)
RBC: 3.51 MIL/uL — ABNORMAL LOW (ref 4.22–5.81)
RDW: 14.5 % (ref 11.5–15.5)
WBC: 8.3 10*3/uL (ref 4.0–10.5)

## 2014-10-28 LAB — MAGNESIUM: Magnesium: 1.7 mg/dL (ref 1.5–2.5)

## 2014-10-30 LAB — GRAM STAIN: SPECIAL REQUESTS: NORMAL

## 2014-11-01 LAB — CULTURE, RESPIRATORY

## 2014-11-01 LAB — CULTURE, RESPIRATORY W GRAM STAIN: Special Requests: NORMAL

## 2014-11-05 ENCOUNTER — Other Ambulatory Visit (HOSPITAL_COMMUNITY): Payer: Self-pay

## 2014-11-05 LAB — BASIC METABOLIC PANEL
Anion gap: 11 (ref 5–15)
BUN: 18 mg/dL (ref 6–20)
CHLORIDE: 95 mmol/L — AB (ref 101–111)
CO2: 23 mmol/L (ref 22–32)
Calcium: 9 mg/dL (ref 8.9–10.3)
Creatinine, Ser: 0.55 mg/dL — ABNORMAL LOW (ref 0.61–1.24)
GFR calc Af Amer: >60 mL/min (ref >60)
GFR calc non Af Amer: >60 mL/min (ref >60)
Glucose, Bld: 91 mg/dL (ref 70–99)
POTASSIUM: 3.9 mmol/L (ref 3.5–5.1)
Sodium: 129 mmol/L — ABNORMAL LOW (ref 135–145)

## 2014-11-05 LAB — CBC
HEMATOCRIT: 30.3 % — AB (ref 39.0–52.0)
Hemoglobin: 10.1 g/dL — ABNORMAL LOW (ref 13.0–17.0)
MCH: 29.4 pg (ref 26.0–34.0)
MCHC: 33.3 g/dL (ref 30.0–36.0)
MCV: 88.1 fL (ref 78.0–100.0)
PLATELETS: 304 10*3/uL (ref 150–400)
RBC: 3.44 MIL/uL — ABNORMAL LOW (ref 4.22–5.81)
RDW: 14.7 % (ref 11.5–15.5)
WBC: 9.6 10*3/uL (ref 4.0–10.5)

## 2014-11-07 LAB — CBC
HEMATOCRIT: 32 % — AB (ref 39.0–52.0)
Hemoglobin: 10.6 g/dL — ABNORMAL LOW (ref 13.0–17.0)
MCH: 28.9 pg (ref 26.0–34.0)
MCHC: 33.1 g/dL (ref 30.0–36.0)
MCV: 87.2 fL (ref 78.0–100.0)
Platelets: 312 10*3/uL (ref 150–400)
RBC: 3.67 MIL/uL — ABNORMAL LOW (ref 4.22–5.81)
RDW: 14.7 % (ref 11.5–15.5)
WBC: 9.6 10*3/uL (ref 4.0–10.5)

## 2014-11-07 LAB — BASIC METABOLIC PANEL
Anion gap: 11 (ref 5–15)
BUN: 18 mg/dL (ref 6–20)
CO2: 26 mmol/L (ref 22–32)
CREATININE: 0.64 mg/dL (ref 0.61–1.24)
Calcium: 8.9 mg/dL (ref 8.9–10.3)
Chloride: 94 mmol/L — ABNORMAL LOW (ref 101–111)
GFR calc Af Amer: >60 mL/min (ref >60)
GLUCOSE: 100 mg/dL — AB (ref 70–99)
Potassium: 3.9 mmol/L (ref 3.5–5.1)
Sodium: 131 mmol/L — ABNORMAL LOW (ref 135–145)

## 2014-11-11 ENCOUNTER — Other Ambulatory Visit (HOSPITAL_COMMUNITY): Payer: PRIVATE HEALTH INSURANCE

## 2014-11-12 LAB — CBC
HCT: 31.6 % — ABNORMAL LOW (ref 39.0–52.0)
HEMOGLOBIN: 10.4 g/dL — AB (ref 13.0–17.0)
MCH: 28.7 pg (ref 26.0–34.0)
MCHC: 32.9 g/dL (ref 30.0–36.0)
MCV: 87.1 fL (ref 78.0–100.0)
PLATELETS: 329 10*3/uL (ref 150–400)
RBC: 3.63 MIL/uL — AB (ref 4.22–5.81)
RDW: 14.2 % (ref 11.5–15.5)
WBC: 7.6 10*3/uL (ref 4.0–10.5)

## 2014-11-12 LAB — BASIC METABOLIC PANEL
ANION GAP: 10 (ref 5–15)
BUN: 12 mg/dL (ref 6–20)
CALCIUM: 9 mg/dL (ref 8.9–10.3)
CO2: 26 mmol/L (ref 22–32)
Chloride: 96 mmol/L — ABNORMAL LOW (ref 101–111)
Creatinine, Ser: 0.5 mg/dL — ABNORMAL LOW (ref 0.61–1.24)
GFR calc Af Amer: >60 mL/min (ref >60)
Glucose, Bld: 116 mg/dL — ABNORMAL HIGH (ref 65–99)
Potassium: 3.7 mmol/L (ref 3.5–5.1)
Sodium: 132 mmol/L — ABNORMAL LOW (ref 135–145)

## 2014-11-20 LAB — URINALYSIS, ROUTINE W REFLEX MICROSCOPIC
BILIRUBIN URINE: NEGATIVE
Glucose, UA: NEGATIVE mg/dL
Hgb urine dipstick: NEGATIVE
Ketones, ur: NEGATIVE mg/dL
Nitrite: NEGATIVE
PH: 6.5 (ref 5.0–8.0)
Protein, ur: NEGATIVE mg/dL
Specific Gravity, Urine: 1.012 (ref 1.005–1.030)
Urobilinogen, UA: 1 mg/dL (ref 0.0–1.0)

## 2014-11-20 LAB — BASIC METABOLIC PANEL
Anion gap: 7 (ref 5–15)
BUN: 16 mg/dL (ref 6–20)
CALCIUM: 8.6 mg/dL — AB (ref 8.9–10.3)
CHLORIDE: 95 mmol/L — AB (ref 101–111)
CO2: 25 mmol/L (ref 22–32)
Creatinine, Ser: 0.52 mg/dL — ABNORMAL LOW (ref 0.61–1.24)
GFR calc Af Amer: >60 mL/min (ref >60)
GFR calc non Af Amer: >60 mL/min (ref >60)
GLUCOSE: 116 mg/dL — AB (ref 65–99)
Potassium: 3.7 mmol/L (ref 3.5–5.1)
Sodium: 127 mmol/L — ABNORMAL LOW (ref 135–145)

## 2014-11-20 LAB — URINE MICROSCOPIC-ADD ON

## 2014-11-21 LAB — BASIC METABOLIC PANEL
ANION GAP: 9 (ref 5–15)
BUN: 13 mg/dL (ref 6–20)
CALCIUM: 8.7 mg/dL — AB (ref 8.9–10.3)
CHLORIDE: 99 mmol/L — AB (ref 101–111)
CO2: 24 mmol/L (ref 22–32)
CREATININE: 0.47 mg/dL — AB (ref 0.61–1.24)
GFR calc non Af Amer: >60 mL/min (ref >60)
Glucose, Bld: 118 mg/dL — ABNORMAL HIGH (ref 65–99)
Potassium: 4.1 mmol/L (ref 3.5–5.1)
Sodium: 132 mmol/L — ABNORMAL LOW (ref 135–145)

## 2014-11-24 ENCOUNTER — Non-Acute Institutional Stay (SKILLED_NURSING_FACILITY): Payer: Medicare Other | Admitting: Internal Medicine

## 2014-11-24 ENCOUNTER — Encounter: Payer: Self-pay | Admitting: Internal Medicine

## 2014-11-24 DIAGNOSIS — I10 Essential (primary) hypertension: Secondary | ICD-10-CM

## 2014-11-24 DIAGNOSIS — R1310 Dysphagia, unspecified: Secondary | ICD-10-CM | POA: Diagnosis not present

## 2014-11-24 DIAGNOSIS — J690 Pneumonitis due to inhalation of food and vomit: Secondary | ICD-10-CM

## 2014-11-24 DIAGNOSIS — F0391 Unspecified dementia with behavioral disturbance: Secondary | ICD-10-CM

## 2014-11-24 DIAGNOSIS — Z794 Long term (current) use of insulin: Secondary | ICD-10-CM

## 2014-11-24 DIAGNOSIS — E118 Type 2 diabetes mellitus with unspecified complications: Secondary | ICD-10-CM | POA: Diagnosis not present

## 2014-11-24 DIAGNOSIS — Z930 Tracheostomy status: Secondary | ICD-10-CM | POA: Insufficient documentation

## 2014-11-24 DIAGNOSIS — Z9889 Other specified postprocedural states: Secondary | ICD-10-CM | POA: Insufficient documentation

## 2014-11-24 DIAGNOSIS — R0989 Other specified symptoms and signs involving the circulatory and respiratory systems: Secondary | ICD-10-CM

## 2014-11-24 DIAGNOSIS — Z931 Gastrostomy status: Secondary | ICD-10-CM

## 2014-11-24 DIAGNOSIS — F03918 Unspecified dementia, unspecified severity, with other behavioral disturbance: Secondary | ICD-10-CM

## 2014-11-24 DIAGNOSIS — E785 Hyperlipidemia, unspecified: Secondary | ICD-10-CM

## 2014-11-24 NOTE — Progress Notes (Signed)
Patient ID: Ricardo Rivera, male   DOB: Jul 15, 1932, 81 y.o.   MRN: [id number]    HISTORY AND PHYSICAL   DATE: 11/24/14  Location:  [facility]    Place of Service: SNF [phone])   Extended Emergency Contact Information Primary Emergency Contact: Garth Schlatter Address: [address]          [place], [place] [address] Johnnette Litter of [place] Phone: [phone] Relation: Spouse Secondary Emergency Contact: Tracey,D Address: step daughter  Johnnette Litter of [place] Phone: [phone] Relation: Other  Advanced Directive information  FULL CODE  Chief Complaint  Patient presents with  . New Admit To SNF    HPI:  81 yo male seen today as a new admission into SNF following transfer from vent facility. He has a trach cuff s/p VDRF for respiratory failure. He requires frequent aggressive suctioning. He underwent an anterior cervical diskectomy and fusion C3-C7 due to spondylosis with myelopathy several week ago. He had respiratory failure s/p extubation due to aspiration pneumonia. He was tx with IV Zosyn. He has protein calorie malnutrition and dysphagia and is s/p Peg tube. He receives TF. He also has hx DM on insulin, chronic pain syndrome, HTN and encephalopathy.  He is a poor historian due to nonverbal and dementia/encephalopathy. Hx obtained from chart. He receives TF osmolite 1.5. He has increased secretions. No sleeping issues.   HTN - BP stable on losartan. He takes ASA daily  DM - he is insulin dependent and takes lantus qhs and 3 units novolog qAC if CBg >150. No low BS reactions. CBGs 80-110s  Anxiety/mood/encephalopathy/dementia - stable on risperdal and zoloft  Hyperlipidemia - stable on fish oil cap  He uses eye gtts for glucoma.  He takes vitamins/minerals. He has nutritional supplements.    DVT prophylaxis with lovenox injection  Arthritis - joint pain stable on voltaren gel    Past Medical History  Diagnosis Date  . Hypertension     . Shortness of breath dyspnea   . Arthritis   . Hyperlipemia   . Frequent urination   . Nocturia     Past Surgical History  Procedure Laterality Date  . Spinal surgery      fusion  . Spinal fusion  1986  . Shoulder surgies Bilateral   . Lower back  1970  . Cholecystectomy  1998  . Tracheostomy tube placement N/A 10/01/2014    Procedure: TRACHEOSTOMY;  Surgeon: Jodi Marble, MD;  Location: [facility];  Service: ENT;  Laterality: N/A;  . Direct laryngoscopy N/A 10/01/2014    Procedure: DIRECT LARYNGOSCOPY;  Surgeon: Jodi Marble, MD;  Location: [facility];  Service: ENT;  Laterality: N/A;  . Anterior cervical decompression/discectomy fusion 4 levels N/A 09/23/2014    Procedure: CERVICAL THREE-FOUR,CERVICAL FOUR-FIVE,CERVICAL FIVE-SIX,CERVICAL SIX-SEVEN,ANTERIOR CERVICAL DECOMPRESSION WITH FUSION INTERBODY PROSTHESIS PLATING AND BONEGRAFT.;  Surgeon: Consuella Lose, MD;  Location: [facility];  Service: Neurosurgery;  Laterality: N/A;    Patient Care Team: Lona Kettle, MD as PCP - General (Family Medicine)  History   Social History  . Marital Status: Married    Spouse Name: N/A  . Number of Children: N/A  . Years of Education: N/A   Occupational History  . Not on file.   Social History Main Topics  . Smoking status: Current Some Day Smoker -- 0.25 packs/day for 50 years  . Smokeless tobacco: Never Used  . Alcohol Use: No  . Drug Use: No  . Sexual Activity: Not on file  Other Topics Concern  . Not on file   Social History Narrative     reports that he has been smoking.  He has never used smokeless tobacco. He reports that he does not drink alcohol or use illicit drugs.  No family history on file. Family Status  Relation Status Death Age  . Mother Deceased   . Father Deceased      There is no immunization history on file for this patient.  Allergies  Allergen Reactions  . Flexeril [Cyclobenzaprine] Other (See Comments)    Hallucinations  . Tramadol Other (See  Comments)    Hallunications    Medications: Patient's Medications  New Prescriptions   No medications on file  Previous Medications   ACETAMINOPHEN (TYLENOL) 500 MG TABLET    Take 500 mg by mouth every 6 (six) hours as needed for mild pain or moderate pain.    ALPRAZOLAM (XANAX) 0.5 MG TABLET    Take 0.5 mg by mouth at bedtime as needed for sleep.    ASPIRIN 81 MG TABLET    Take 81 mg by mouth daily.     BRIMONIDINE (ALPHAGAN P) 0.1 % SOLN    Place 1 drop into both eyes 3 (three) times daily.    CHLORHEXIDINE (PERIDEX) 0.12 % SOLUTION    15 mLs by Mouth Rinse route 2 (two) times daily.   CHOLECALCIFEROL (VITAMIN D) 2000 UNITS CAPS    Take 2,000 Units by mouth daily.    DIAZEPAM (VALIUM) 5 MG TABLET    Take 1 tablet (5 mg total) by mouth every 6 (six) hours as needed for muscle spasms.   DOCUSATE SODIUM (COLACE) 100 MG CAPSULE    Take 1 capsule (100 mg total) by mouth daily as needed for mild constipation.   GABAPENTIN (NEURONTIN) 300 MG CAPSULE    Take 1 capsule (300 mg total) by mouth at bedtime.   GEMFIBROZIL (LOPID) 600 MG TABLET    Take 600 mg by mouth 2 (two) times daily before a meal.     HEPARIN 5000 UNIT/ML INJECTION    Inject 1 mL (5,000 Units total) into the skin every 8 (eight) hours.   LATANOPROST (XALATAN) 0.005 % OPHTHALMIC SOLUTION    Place 1 drop into both eyes at bedtime.   LOSARTAN (COZAAR) 50 MG TABLET    Take 50 mg by mouth daily.     METHYLPREDNISOLONE (MEDROL DOSPACK) 4 MG TABLET    follow package directions   METOPROLOL (LOPRESSOR) 1 MG/ML INJECTION    Inject 2.5-5 mLs (2.5-5 mg total) into the vein every 3 (three) hours as needed.   MULTIPLE VITAMINS-MINERALS (CENTRUM SILVER ADULT 50+ PO)    Take 1 tablet by mouth daily.   NUTRITIONAL SUPPLEMENTS (FEEDING SUPPLEMENT, VITAL AF 1.2 CAL,) LIQD    Place 1,000 mLs into feeding tube continuous.   OXYBUTYNIN (DITROPAN) 5 MG TABLET    Take 5 mg by mouth daily.   PANTOPRAZOLE (PROTONIX) 40 MG TABLET    Take 40 mg by mouth  daily.   SENNA (SENOKOT) 8.6 MG TABS TABLET    Take 1 tablet (8.6 mg total) by mouth daily as needed for mild constipation.   WATER FOR IRRIGATION, STERILE (FREE WATER) SOLN    Place 100 mLs into feeding tube every 8 (eight) hours.  Modified Medications   No medications on file  Discontinued Medications   No medications on file    Review of Systems  Unable to perform ROS: Patient nonverbal    Filed Vitals:  11/24/14 1721  BP: 147/70  Pulse: 86  Temp: 98.6 F (37 C)  Pulse ox 94% on O2 via trach collar There is no weight on file to calculate BMI.  Physical Exam  Constitutional: No distress.  Frail appearing in NAD. Nonverbal. Cervical collar intact  HENT:  Mouth/Throat: Oropharynx is clear and moist.  Eyes: Pupils are equal, round, and reactive to light. No scleral icterus.  Neck: Neck supple. Carotid bruit is not present. No thyromegaly present.  Cardiovascular: Normal rate, regular rhythm, normal heart sounds and intact distal pulses.  Exam reveals no gallop and no friction rub.   No murmur heard. no distal LE swelling. No calf TTP  Pulmonary/Chest: He is in respiratory distress. He has no decreased breath sounds. He has wheezes (end expiratory with prolonged expiratory phase). He has rhonchi (b/l). He has no rales. He exhibits no tenderness.    Abdominal: Soft. Bowel sounds are normal. He exhibits no distension, no abdominal bruit, no pulsatile midline mass and no mass. There is no tenderness. There is no rebound and no guarding.  Peg intact - no d/c or redness at insertion site  Musculoskeletal: He exhibits edema and tenderness.  Lymphadenopathy:    He has no cervical adenopathy.  Neurological: He is alert.  Skin: Skin is warm and dry. No rash noted.  Psychiatric: He has a normal mood and affect. His behavior is normal.     Labs reviewed: Admission on 10/05/2014, Discharged on 11/21/2014  No results displayed because visit has over 200 results.    Admission on  09/23/2014, Discharged on 10/05/2014  No results displayed because visit has over 200 results.    Hospital Outpatient Visit on 09/05/2014  Component Date Value Ref Range Status  . MRSA, PCR 09/05/2014 NEGATIVE  NEGATIVE Final  . Staphylococcus aureus 09/05/2014 NEGATIVE  NEGATIVE Final   Comment:        The Xpert SA Assay (FDA approved for NASAL specimens in patients over 53 years of age), is one component of a comprehensive surveillance program.  Test performance has been validated by [facility] for patients greater than or equal to 87 year old. It is not intended to diagnose infection nor to guide or monitor treatment.   . Sodium 09/05/2014 134* 135 - 145 mmol/L Final  . Potassium 09/05/2014 4.2  3.5 - 5.1 mmol/L Final  . Chloride 09/05/2014 98  96 - 112 mmol/L Final  . CO2 09/05/2014 27  19 - 32 mmol/L Final  . Glucose, Bld 09/05/2014 79  70 - 99 mg/dL Final  . BUN 09/05/2014 13  6 - 23 mg/dL Final  . Creatinine, Ser 09/05/2014 0.88  0.50 - 1.35 mg/dL Final  . Calcium 09/05/2014 9.4  8.4 - 10.5 mg/dL Final  . GFR calc non Af Amer 09/05/2014 80* >90 mL/min Final  . GFR calc Af Amer 09/05/2014 >90  >90 mL/min Final   Comment: (NOTE) The eGFR has been calculated using the CKD EPI equation. This calculation has not been validated in all clinical situations. eGFR's persistently <90 mL/min signify possible Chronic Kidney Disease.   . Anion gap 09/05/2014 9  5 - 15 Final  . WBC 09/05/2014 8.9  4.0 - 10.5 K/uL Final  . RBC 09/05/2014 4.42  4.22 - 5.81 MIL/uL Final  . Hemoglobin 09/05/2014 13.1  13.0 - 17.0 g/dL Final  . HCT 09/05/2014 37.9* 39.0 - 52.0 % Final  . MCV 09/05/2014 85.7  78.0 - 100.0 fL Final  . MCH 09/05/2014 29.6  26.0 - 34.0 pg Final  . MCHC 09/05/2014 34.6  30.0 - 36.0 g/dL Final  . RDW 09/05/2014 13.8  11.5 - 15.5 % Final  . Platelets 09/05/2014 250  150 - 400 K/uL Final    Dg Chest Port 1 View  11/05/2014   CLINICAL DATA:  Respiratory failure  EXAM:  PORTABLE CHEST - 1 VIEW  COMPARISON:  10/22/2014  FINDINGS: Tracheostomy device stable. Patchy infiltrates or subsegmental atelectasis in both lung bases, largely stable. Heart size normal. Central pulmonary vascular congestion. No effusion. Cervical fixation hardware partially seen.  IMPRESSION: 1. Little interval change in bibasilar atelectasis or infiltrates.   Electronically Signed   By: Lucrezia Europe M.D.   On: 11/05/2014 08:50     Assessment/Plan   ICD-9-CM ICD-10-CM   1. Chest congestion 786.9 R09.89    with hx atelectasis on CXR; s/p tx for aspiration pneumonitis  2. Tracheostomy status V44.0 Z93.0   3. PEG (percutaneous endoscopic gastrostomy) status due to #4 V44.1 Z93.1   4. Dysphagia 787.20 R13.10   5. S/P cervical discectomy V45.89 Z98.89   6. Aspiration pneumonitis 507.0 J69.0   7.      DM 2, insulin use 8.      Dementia with behavioral disturbance 9.      Essential hypertension 10.    Hyperlipidemia   --Rx duoneb q6hrs for congestion and wheezing. May need CXR if no improvrment  --use saline bullets prn when suctioning trach. Needs aggressive pulmonary toilet  --continue other meds as ordered  --PT/OT/ST as indicated  --CBGs qAC and qHS  --GOAL: short term rehab with potential for long term care. Communicated with pt and nursing.  Jenel Gierke S. Perlie Gold  [facility] [address] [place], [place] [address] [phone] Cell (Monday-Friday 8 AM - 5 PM) [phone] After 5 PM and follow prompts

## 2014-11-28 ENCOUNTER — Emergency Department (HOSPITAL_COMMUNITY)
Admission: EM | Admit: 2014-11-28 | Discharge: 2014-11-28 | Disposition: A | Discharge status: Home / Self Care | Payer: Medicare Other | Attending: Emergency Medicine | Admitting: Emergency Medicine

## 2014-11-28 ENCOUNTER — Encounter (HOSPITAL_COMMUNITY): Payer: Self-pay | Admitting: Emergency Medicine

## 2014-11-28 ENCOUNTER — Emergency Department (HOSPITAL_COMMUNITY): Payer: Medicare Other

## 2014-11-28 DIAGNOSIS — F0390 Unspecified dementia without behavioral disturbance: Secondary | ICD-10-CM | POA: Insufficient documentation

## 2014-11-28 DIAGNOSIS — Z930 Tracheostomy status: Secondary | ICD-10-CM

## 2014-11-28 DIAGNOSIS — Z720 Tobacco use: Secondary | ICD-10-CM | POA: Diagnosis not present

## 2014-11-28 DIAGNOSIS — E119 Type 2 diabetes mellitus without complications: Secondary | ICD-10-CM | POA: Diagnosis not present

## 2014-11-28 DIAGNOSIS — E785 Hyperlipidemia, unspecified: Secondary | ICD-10-CM | POA: Diagnosis not present

## 2014-11-28 DIAGNOSIS — I10 Essential (primary) hypertension: Secondary | ICD-10-CM | POA: Insufficient documentation

## 2014-11-28 DIAGNOSIS — Z79899 Other long term (current) drug therapy: Secondary | ICD-10-CM | POA: Insufficient documentation

## 2014-11-28 DIAGNOSIS — J9503 Malfunction of tracheostomy stoma: Secondary | ICD-10-CM | POA: Diagnosis not present

## 2014-11-28 DIAGNOSIS — Z7982 Long term (current) use of aspirin: Secondary | ICD-10-CM | POA: Insufficient documentation

## 2014-11-28 HISTORY — DX: Type 2 diabetes mellitus without complications: E11.9

## 2014-11-28 NOTE — ED Notes (Signed)
[facility] called for transport and report provided to facility.

## 2014-11-28 NOTE — ED Provider Notes (Signed)
CSN: [id number]     Arrival date & time 11/28/14  0445 History   First MD Initiated Contact with Patient 11/28/14 [phone]     Chief Complaint  Patient presents with  . Tracheostomy Tube Change  . Pneumonia    previous diagnosis     (Consider location/radiation/quality/duration/timing/severity/associated sxs/prior Treatment) HPI Comments: LEVEL 5 CAVEAT FOR DEMENTIA.  Pt comes in with cc of trach insertion. Pt has a trach in place due to chronic resp failure from recent admission and is at a LTAC facility. Pt pulled his cuffed trach out and was sent to the ER. He is in no resp distress. There is some bleeding around the trach site.  Patient is a 81 y.o. male presenting with pneumonia. The history is provided by the patient.  Pneumonia    Past Medical History  Diagnosis Date  . Hypertension   . Shortness of breath dyspnea   . Arthritis   . Hyperlipemia   . Frequent urination   . Nocturia   . Diabetes mellitus without complication    Past Surgical History  Procedure Laterality Date  . Spinal surgery      fusion  . Spinal fusion  1986  . Shoulder surgies Bilateral   . Lower back  1970  . Cholecystectomy  1998  . Tracheostomy tube placement N/A 10/01/2014    Procedure: TRACHEOSTOMY;  Surgeon: Flo ShanksKarol Wolicki, MD;  [facility];  Service: ENT;  Laterality: N/A;  . Direct laryngoscopy N/A 10/01/2014    Procedure: DIRECT LARYNGOSCOPY;  Surgeon: Flo ShanksKarol Wolicki, MD;  [facility];  Service: ENT;  Laterality: N/A;  . Anterior cervical decompression/discectomy fusion 4 levels N/A 09/23/2014    Procedure: CERVICAL THREE-FOUR,CERVICAL FOUR-FIVE,CERVICAL FIVE-SIX,CERVICAL SIX-SEVEN,ANTERIOR CERVICAL DECOMPRESSION WITH FUSION INTERBODY PROSTHESIS PLATING AND BONEGRAFT.;  Surgeon: Lisbeth RenshawNeelesh Nundkumar, MD;  Location: [facility];  Service: Neurosurgery;  Laterality: N/A;   No family history on file. History  Substance Use Topics  . Smoking status: Current Some Day Smoker -- 0.25 packs/day  for 50 years  . Smokeless tobacco: Never Used  . Alcohol Use: No    Review of Systems  Unable to perform ROS: Dementia      Allergies  Flexeril and Tramadol  Home Medications   Prior to Admission medications   Medication Sig Start Date End Date Taking? Authorizing Provider  Amino Acids-Protein Hydrolys (FEEDING SUPPLEMENT, PRO-STAT SUGAR FREE 64,) LIQD Take 30 mLs by mouth daily.   Yes Historical Provider, MD  antiseptic oral rinse (BIOTENE) LIQD 15 mLs by Mouth Rinse route 3 (three) times daily.   Yes Historical Provider, MD  aspirin 81 MG tablet 81 mg by Gastric Tube route daily.    Yes Historical Provider, MD  brimonidine (ALPHAGAN) 0.2 % ophthalmic solution Place 1 drop into both eyes 3 (three) times daily.   Yes Historical Provider, MD  Cholecalciferol (VITAMIN D) 2000 UNITS CAPS 2,000 Units by Gastric Tube route daily.    Yes Historical Provider, MD  diclofenac sodium (VOLTAREN) 1 % GEL Apply 2 g topically 2 (two) times daily.   Yes Historical Provider, MD  enoxaparin (LOVENOX) 40 MG/0.4ML injection Inject 40 mg into the skin daily.   Yes Historical Provider, MD  famotidine (PEPCID) 20 MG tablet 20 mg by Gastric Tube route 2 (two) times daily.   Yes Historical Provider, MD  insulin aspart (NOVOLOG) 100 UNIT/ML FlexPen Inject 3 Units into the skin 3 (three) times daily with meals. 3 units for CBG greater than 150 before meals  Yes Historical Provider, MD  Insulin Glargine (LANTUS) 100 UNIT/ML Solostar Pen Inject 10 Units into the skin every evening.   Yes Historical Provider, MD  ipratropium-albuterol (DUONEB) 0.5-2.5 (3) MG/3ML SOLN Take 3 mLs by nebulization every 6 (six) hours.   Yes Historical Provider, MD  latanoprost (XALATAN) 0.005 % ophthalmic solution Place 1 drop into both eyes at bedtime.   Yes Historical Provider, MD  losartan (COZAAR) 50 MG tablet 50 mg by Gastric Tube route daily.    Yes Historical Provider, MD  Nutritional Supplements (FEEDING SUPPLEMENT, OSMOLITE  1.5 CAL,) LIQD Place 60 mLs into feeding tube every hour. Give 43ml/hr Three times a day.   Yes Historical Provider, MD  Omega 3 1000 MG CAPS 2,000 mg by Gastric Tube route daily.   Yes Historical Provider, MD  oxybutynin (DITROPAN) 5 MG tablet 5 mg by Gastric Tube route daily.    Yes Historical Provider, MD  Protein (PROSOURCE) PACK 1 each by Gastric Tube route 2 (two) times daily.   Yes Historical Provider, MD  risperiDONE (RISPERDAL) 0.5 MG tablet 0.5 mg by Gastric Tube route at bedtime.   Yes Historical Provider, MD  saccharomyces boulardii (FLORASTOR) 250 MG capsule 250 mg by Gastric Tube route 2 (two) times daily.   Yes Historical Provider, MD  sertraline (ZOLOFT) 100 MG tablet 100 mg by Gastric Tube route daily.   Yes Historical Provider, MD  Water For Irrigation, Sterile (FREE WATER) SOLN Place 100 mLs into feeding tube every 8 (eight) hours. 10/05/14  Yes Lisbeth Renshaw, MD  acetaminophen (TYLENOL) 500 MG tablet Take 500 mg by mouth every 6 (six) hours as needed for mild pain or moderate pain.     Historical Provider, MD  ALPRAZolam Prudy Feeler) 0.5 MG tablet Take 0.5 mg by mouth at bedtime as needed for sleep.     Historical Provider, MD  chlorhexidine (PERIDEX) 0.12 % solution 15 mLs by Mouth Rinse route 2 (two) times daily. Patient not taking: Reported on 11/28/2014 10/05/14   Lisbeth Renshaw, MD  diazepam (VALIUM) 5 MG tablet Take 1 tablet (5 mg total) by mouth every 6 (six) hours as needed for muscle spasms. Patient not taking: Reported on 11/28/2014 10/05/14   Lisbeth Renshaw, MD  docusate sodium (COLACE) 100 MG capsule Take 1 capsule (100 mg total) by mouth daily as needed for mild constipation. Patient not taking: Reported on 11/28/2014 10/05/14   Lisbeth Renshaw, MD  gabapentin (NEURONTIN) 300 MG capsule Take 1 capsule (300 mg total) by mouth at bedtime. Patient not taking: Reported on 09/01/2014 01/01/14   Nelva Nay, MD  gemfibrozil (LOPID) 600 MG tablet Take 600 mg by mouth 2 (two)  times daily before a meal.      Historical Provider, MD  heparin 5000 UNIT/ML injection Inject 1 mL (5,000 Units total) into the skin every 8 (eight) hours. Patient not taking: Reported on 11/28/2014 10/05/14   Lisbeth Renshaw, MD  methylPREDNIsolone (MEDROL DOSPACK) 4 MG tablet follow package directions Patient not taking: Reported on 11/28/2014 10/05/14   Lisbeth Renshaw, MD  metoprolol (LOPRESSOR) 1 MG/ML injection Inject 2.5-5 mLs (2.5-5 mg total) into the vein every 3 (three) hours as needed. Patient not taking: Reported on 11/28/2014 10/05/14   Lisbeth Renshaw, MD  Multiple Vitamins-Minerals (CENTRUM SILVER ADULT 50+ PO) Take 1 tablet by mouth daily.    Historical Provider, MD  Nutritional Supplements (FEEDING SUPPLEMENT, VITAL AF 1.2 CAL,) LIQD Place 1,000 mLs into feeding tube continuous. Patient not taking: Reported on 11/28/2014 10/05/14  Lisbeth Renshaw, MD  pantoprazole (PROTONIX) 40 MG tablet Take 40 mg by mouth daily.    Historical Provider, MD  senna (SENOKOT) 8.6 MG TABS tablet Take 1 tablet (8.6 mg total) by mouth daily as needed for mild constipation. Patient not taking: Reported on 11/28/2014 10/05/14   Lisbeth Renshaw, MD   BP 120/64 mmHg  Pulse 99  Temp(Src) 98.2 F (36.8 C) (Axillary)  Resp 21  Ht 6' (1.829 m)  Wt 180 lb (81.647 kg)  BMI 24.41 kg/m2  SpO2 97% Physical Exam  Constitutional: He appears well-developed.  HENT:  Head: Atraumatic.  Neck: Neck supple.  Trach stoma is bloody  Cardiovascular: Normal rate.   Pulmonary/Chest: Effort normal.  Skin: Skin is warm.  Nursing note and vitals reviewed.   ED Course  Procedures (including critical care time) Labs Review Labs Reviewed - No data to display  Imaging Review Dg Chest Portable 1 View  11/28/2014   CLINICAL DATA:  Tracheostomy tube change  EXAM: PORTABLE CHEST - 1 VIEW  COMPARISON:  11/05/2014  FINDINGS: Tracheostomy tube tip is above the carina. The heart size appears normal. Atelectasis is noted  within the right lung base.  IMPRESSION: 1. Satisfactory position of tracheostomy tube with tip above carina. 2. Right base atelectasis.   Electronically Signed   By: Signa Kell M.D.   On: 11/28/2014 07:32     EKG Interpretation None      MDM   Final diagnoses:  Tracheostomy in place    Pt comes in for trach reinsertion. We placed bougie in the stoma, and were able to re insert a shiley cuffed. RT placed the shiley in, 6-0. CXr confirms the placement, will d.c.    Derwood Kaplan, MD 11/28/14 [phone]

## 2014-11-28 NOTE — Discharge Instructions (Signed)
Teach has been replaced. Continue expectant management.   Care of a Tracheostomy Tube Keeping the tracheostomy tube clean helps prevent infections and keeps certain trach tubing from plugging. A tracheostomy tube is commonly known as a trach tube. You may have one tube (an outer cannula), or you may have two tubes (an outer and inner cannula). The inner cannula that fits inside the outer cannula is removed for cleaning or replacement. Follow your caregiver's directions as to how often you should change and clean your trach tube.  SUPPLIES NEEDED  Towel.  Suction supplies.  Sterile trach care kit.  4x4 inch (10x10 cm) gauze pads.  Sterile cotton-tipped swabs.  Sterile trach bandage (dressing).  Sterile container.  0.9% saline solution.  Small sterile brush (or disposable inner cannula).  Roll of twill tape, trach ties, or trach holder.  Scissors.  Clean gloves.  Sterile gloves. TRACHEOSTOMY CARE   Have all supplies ready and available.  Wash hands well.  Put on clean gloves.  Suction the trach tube as needed.  When suctioning is complete, remove soiled trach dressings, gloves, and suction catheter. Throw away the dressings and coiled catheter in the glove. To coil the catheter, roll the catheter around the fingers. Then, pull the glove off inside out so that catheter remains coiled in glove.  Wash hands well.  Put on sterile gloves.  Fill a container 0.9% saline solution.  Give oxygen as needed.  Clean the inner cannula. Nondisposable inner cannula.  While only touching the outer part of the trach tube, unlock and remove the inner cannula.  Drop the inner cannula into 0.9% saline solution. The saline will loosen secretions.  Replace the trach collar, trach tube, or ventilator oxygen source over the outer cannula. Do not attach the trach tube and ventilator oxygen devices to all outer cannulas when the inner cannula is removed.  Quickly pick up the inner  cannula out of the saline solution. Use a small brush to remove the secretions on the inside and outside of the inner cannula.  Hold the inner cannula over the container. Rinse the cannula with 0.9% saline solution.  Replace the inner cannula. Secure the locking mechanism.  Give oxygen as needed. Disposable inner cannula.  Remove the new cannula from the packaging.  Take out the inner cannula while touching only the outer part of the trach tube.  Replace the old cannula with the new cannula. Lock it into position.  Throw away the old cannula.  Give oxygen as needed.  Clean the outer cannula surfaces with gauze or cotton swabs, extending 2-4 inches (5-10 cm) in all directions under the neck plate. Using a cotton swab, clean the stoma site in a circular motion from the stoma site outward.  Dry the skin and the outer cannula by patting the area gently with a dry gauze pad.  Secure the trach tube with trach ties or a trach tube holder.  Place a sterile dressing around the trach site.  Give oxygen as needed.  Throw away any used supplies.  Remove gloves.  Wash hands well. Document Released: 07/30/2006 Document Revised: 06/03/2012 Document Reviewed: 01/24/2012 [facility] Patient Information 2015 [place], [place]. This information is not intended to replace advice given to you by your health care provider. Make sure you discuss any questions you have with your health care provider.  Tracheostomy Tube Safety A tracheostomy tube (commonly known as trach tube) allows a person to breath without using his or her nose or mouth. A trach tube may  be needed if:  A person's airway is blocked by swelling, injury, tumor, foreign body, vocal cord problem, or severe narrowing of the trachea.  A person needs long-term ventilation.  A person has excess airway secretions requiring frequent suctioning. If you have a trach, you must follow certain safety measures to keep yourself safe and free of  infection. SAFETY MEASURES  Always carry your emergency travel kit with you when you leave the house. Your bag should include:  A portable suction machine.  Suction catheters.  A mucus trap.  A bulb syringe.  Two trach tubes (one the same size and one smaller).  Basile.  Heat and moisture exchanger.  Emergency phone numbers.  Sterile water.  0.9% saline solution.  Sterile gloves or hand sanitizer.  Sterile gauze pads.  Clean your stoma site as directed to prevent infection.  Secure the trach tube exactly as directed to keep the tube from moving out of place.  Suction the trach tube as often as directed and exactly as directed.  Avoid dust, mold, tobacco smoke, other types of smoke, and fumes from cleaning solutions, such as ammonia or bleach.  Cover your trach tube when using any kind of spray product or powder. It is important that you do not inhale the mist or powder.  Do not sterilize plastic tubes or attempt to clean them in boiling water. They are to be used only once.  Do not store replacement plastic trach tubes in a location in which the temperature is over 118 F (48 C).  If you have a cuffed trach tube, do not over inflate the cuff. This can injure your trachea. It may also cause the cuff to extend past the end of the tube where it can restrict or block air flow.  If you cannot remove your trach tube or the smaller tube that fits inside the trach tube (inner cannula), do not force it. Call your caregiver.  Use a humidifier at home to keep some moisture in the air and to prevent your airway and lungs from drying out. Clean the humidifier regularly to prevent buildup of mold and mildew.  Do not put anything in your trach tube that should not be there.  Keep your stoma and trach tube dry when you bathe or shower. Direct the shower spray at chest level and place a shower shield or protective covering over your trach tube.  Keep clothing away from the  trach tube except for a protective scarf. Clothing may block the trach tube. Avoid crew necks and turtlenecks. Wear v-neck shirts and open collar shirts or blouses. Do not wear clothes that shed fibers or lint.  If going outside in very cold air, wear a filter to prevent cold air from entering the trach tube. You can also loosely cover the trach tube with a protective scarf, handkerchief, or gauze. This helps to warm the air as you breathe, so that the cold air does not irritate your trachea and lungs. It also helps to keep out dust or dirt on windy days.  Sickness:  Suction more frequently is you become sick.  Drink enough fluids to keep your urine clear or pale yellow if you have a fever, vomiting, or diarrhea.  If you vomit, cover your trach tube with a towel to keep vomit out of your airway. If you think vomit may have entered the trach tube, suction right away.  Post CPR instructions and emergency numbers where they can be seen in an emergency. All  of your caregivers must know CPR.  If you use a ventilator:  Routinely check the ventilator safety and sound alarms to be sure they work properly.  Be sure the ventilator tubes are properly placed so that they do not pull on the trach tube.  Do not twist or pull on the trach connector more than needed. This may cause discomfort or disconnect the ventilator tubes.  Hold the trach tube in place when hooking up or disconnecting the ventilator or humidification tubing.  Always use an inner cannula without side openings (non-fenestrated) with the correct connector if the trach tube has one or more side openings (fenestrated trach tube).  Obtain ongoing support as needed to adjust to living with trach tube. SEEK IMMEDIATE MEDICAL CARE IF:   You have difficulty breathing even after suctioning and cleaning.  You have swelling, redness, warmth, drainage, or tenderness around the stoma.  You have a fever or persistent symptoms for more than 2  to 3 days.  You have a fever and your symptoms suddenly get worse.  You have chills or muscle aches.  You have nausea and vomiting.  You feel dizzy or feel faint.  You have difficulty swallowing.  You have unusual sounds coming from the airway or continue to cough after suctioning.  You have chest pain or have difficulty breathing (despite a clean and properly placed tube).  You have bleeding from the stoma.  You have bright red blood in your mucus.  Your tube becomes plugged and you cannot clear it.  Your tube falls out and cannot be reinserted. Document Released: 03/11/2012 Document Reviewed: 03/11/2012 [facility] Patient Information 2015 [place]. This information is not intended to replace advice given to you by your health care provider. Make sure you discuss any questions you have with your health care provider.

## 2014-11-28 NOTE — ED Notes (Signed)
Per EMS:  Coming from [facility], pulled trach out.  Physician at original facility, patient pulled trach out, physician tried to re-insert, unsuccessfully. Sent here.  Alert and oriented to normal per staff.  Pt has hx of combativeness. Airway intact for now.  4 liters nasal cannula, 96-97%.  Now 94% on room air.  5/23 admitted for pneumonia and respiratory failure, rhoncus lung sounds throughout.  Respiratory at bedside 136/68, 98, 20rr, 97%  6 cuffless

## 2014-11-28 NOTE — ED Notes (Signed)
Trach re-inserted, see RT note.  Positive color change noted on colormetric etCO2

## 2014-11-28 NOTE — Procedures (Signed)
Tracheostomy Change Note  Patient Details:   Name: Ricardo Rivera DOB: 10/14/1932 MRN: [id number]    Airway Documentation:     Evaluation  O2 sats: stable throughout Complications: No apparent complications Patient did tolerate procedure well.  Patient in ER with trach out for unknown time period. Tried insertion times two with no success, I attempted the third try and was successful after moderate pressure applied and patient began coughing and #6 CFS Shiley slid into place and patient began coughing. Good air movement through trach with some blood after and during insertion. Patient was angry but calmed quickly after insertion complete. Assisted by Terri C. RRT,RCP. Positive EtCO2 and suctioning post insertion and x ray pending.     Erskine SpeedMike Jr, Jazz Rogala William 11/28/2014, 5:49 AM

## 2014-11-28 NOTE — ED Notes (Signed)
RT at bedside to re-insert trach

## 2014-11-28 NOTE — ED Notes (Signed)
EDP at bedside to assist with re-insertion.

## 2014-12-01 ENCOUNTER — Non-Acute Institutional Stay (SKILLED_NURSING_FACILITY): Payer: Medicare Other | Admitting: Adult Health

## 2014-12-01 DIAGNOSIS — J9620 Acute and chronic respiratory failure, unspecified whether with hypoxia or hypercapnia: Secondary | ICD-10-CM

## 2014-12-01 DIAGNOSIS — R443 Hallucinations, unspecified: Secondary | ICD-10-CM

## 2014-12-01 DIAGNOSIS — F0151 Vascular dementia with behavioral disturbance: Secondary | ICD-10-CM

## 2014-12-01 DIAGNOSIS — F01518 Vascular dementia, unspecified severity, with other behavioral disturbance: Secondary | ICD-10-CM

## 2014-12-02 ENCOUNTER — Emergency Department (HOSPITAL_COMMUNITY): Payer: Medicare Other

## 2014-12-02 ENCOUNTER — Encounter (HOSPITAL_COMMUNITY): Payer: Self-pay | Admitting: *Deleted

## 2014-12-02 ENCOUNTER — Emergency Department (HOSPITAL_COMMUNITY)
Admission: EM | Admit: 2014-12-02 | Discharge: 2014-12-03 | Disposition: A | Discharge status: Home / Self Care | Payer: Medicare Other | Attending: Emergency Medicine | Admitting: Emergency Medicine

## 2014-12-02 DIAGNOSIS — M1990 Unspecified osteoarthritis, unspecified site: Secondary | ICD-10-CM | POA: Insufficient documentation

## 2014-12-02 DIAGNOSIS — I10 Essential (primary) hypertension: Secondary | ICD-10-CM | POA: Diagnosis not present

## 2014-12-02 DIAGNOSIS — Z794 Long term (current) use of insulin: Secondary | ICD-10-CM | POA: Insufficient documentation

## 2014-12-02 DIAGNOSIS — E119 Type 2 diabetes mellitus without complications: Secondary | ICD-10-CM | POA: Insufficient documentation

## 2014-12-02 DIAGNOSIS — Z79899 Other long term (current) drug therapy: Secondary | ICD-10-CM | POA: Insufficient documentation

## 2014-12-02 DIAGNOSIS — I679 Cerebrovascular disease, unspecified: Secondary | ICD-10-CM | POA: Diagnosis not present

## 2014-12-02 DIAGNOSIS — Y929 Unspecified place or not applicable: Secondary | ICD-10-CM | POA: Diagnosis not present

## 2014-12-02 DIAGNOSIS — Y939 Activity, unspecified: Secondary | ICD-10-CM | POA: Diagnosis not present

## 2014-12-02 DIAGNOSIS — W1839XA Other fall on same level, initial encounter: Secondary | ICD-10-CM | POA: Insufficient documentation

## 2014-12-02 DIAGNOSIS — Y999 Unspecified external cause status: Secondary | ICD-10-CM | POA: Insufficient documentation

## 2014-12-02 DIAGNOSIS — Z720 Tobacco use: Secondary | ICD-10-CM | POA: Insufficient documentation

## 2014-12-02 DIAGNOSIS — W19XXXA Unspecified fall, initial encounter: Secondary | ICD-10-CM

## 2014-12-02 DIAGNOSIS — Z7982 Long term (current) use of aspirin: Secondary | ICD-10-CM | POA: Insufficient documentation

## 2014-12-02 DIAGNOSIS — Z043 Encounter for examination and observation following other accident: Secondary | ICD-10-CM | POA: Insufficient documentation

## 2014-12-02 LAB — CBC WITH DIFFERENTIAL/PLATELET
BASOS ABS: 0 10*3/uL (ref 0.0–0.1)
Basophils Relative: 0 % (ref 0–1)
Eosinophils Absolute: 0.2 10*3/uL (ref 0.0–0.7)
Eosinophils Relative: 2 % (ref 0–5)
HEMATOCRIT: 33 % — AB (ref 39.0–52.0)
HEMOGLOBIN: 10.8 g/dL — AB (ref 13.0–17.0)
Lymphocytes Relative: 29 % (ref 12–46)
Lymphs Abs: 2.7 10*3/uL (ref 0.7–4.0)
MCH: 29 pg (ref 26.0–34.0)
MCHC: 32.7 g/dL (ref 30.0–36.0)
MCV: 88.7 fL (ref 78.0–100.0)
MONO ABS: 0.7 10*3/uL (ref 0.1–1.0)
Monocytes Relative: 7 % (ref 3–12)
NEUTROS ABS: 5.7 10*3/uL (ref 1.7–7.7)
Neutrophils Relative %: 62 % (ref 43–77)
Platelets: 285 10*3/uL (ref 150–400)
RBC: 3.72 MIL/uL — AB (ref 4.22–5.81)
RDW: 14.5 % (ref 11.5–15.5)
WBC: 9.2 10*3/uL (ref 4.0–10.5)

## 2014-12-02 LAB — COMPREHENSIVE METABOLIC PANEL
ALBUMIN: 2.6 g/dL — AB (ref 3.5–5.0)
ALT: 29 U/L (ref 17–63)
ANION GAP: 10 (ref 5–15)
AST: 21 U/L (ref 15–41)
Alkaline Phosphatase: 93 U/L (ref 38–126)
BUN: 25 mg/dL — ABNORMAL HIGH (ref 6–20)
CALCIUM: 9 mg/dL (ref 8.9–10.3)
CO2: 24 mmol/L (ref 22–32)
Chloride: 103 mmol/L (ref 101–111)
Creatinine, Ser: 0.49 mg/dL — ABNORMAL LOW (ref 0.61–1.24)
GFR calc Af Amer: >60 mL/min (ref >60)
GFR calc non Af Amer: >60 mL/min (ref >60)
Glucose, Bld: 90 mg/dL (ref 65–99)
POTASSIUM: 3.5 mmol/L (ref 3.5–5.1)
Sodium: 137 mmol/L (ref 135–145)
TOTAL PROTEIN: 8.2 g/dL — AB (ref 6.5–8.1)
Total Bilirubin: 0.7 mg/dL (ref 0.3–1.2)

## 2014-12-02 LAB — URINALYSIS, ROUTINE W REFLEX MICROSCOPIC
BILIRUBIN URINE: NEGATIVE
GLUCOSE, UA: NEGATIVE mg/dL
Hgb urine dipstick: NEGATIVE
Ketones, ur: NEGATIVE mg/dL
Nitrite: NEGATIVE
PH: 8 (ref 5.0–8.0)
Protein, ur: 30 mg/dL — AB
Specific Gravity, Urine: 1.023 (ref 1.005–1.030)
Urobilinogen, UA: 1 mg/dL (ref 0.0–1.0)

## 2014-12-02 LAB — URINE MICROSCOPIC-ADD ON

## 2014-12-02 MED ORDER — SODIUM CHLORIDE 0.9 % IV BOLUS (SEPSIS)
1000.0000 mL | Freq: Once | INTRAVENOUS | Status: AC
  Administered 2014-12-02: 1000 mL via INTRAVENOUS

## 2014-12-02 NOTE — ED Notes (Signed)
Patient transported to CT 

## 2014-12-02 NOTE — Progress Notes (Signed)
Rt called to ED room 17 for Trach pt. Pt assessed pt on room air sats 92%. Pt not here for trach issues. Pt in no distress at this time.

## 2014-12-02 NOTE — ED Notes (Addendum)
Per EMS[facility]  Staff reports finding pt on the floor beside his bed.  Pt is not ambulatory.  A&O per norm.  Pt has a trach and G-tube  In place.

## 2014-12-02 NOTE — ED Notes (Signed)
Bed: WA17 Expected date:  Expected time:  Means of arrival:  Comments: EMS/70F/fall

## 2014-12-02 NOTE — ED Provider Notes (Addendum)
CSN: [id number]     Arrival date & time 12/02/14  1825 History   First MD Initiated Contact with Patient 12/02/14 1917     Chief Complaint  Patient presents with  . Fall     (Consider location/radiation/quality/duration/timing/severity/associated sxs/prior Treatment) Patient is a 81 y.o. male presenting with fall.  Fall This is a recurrent problem. The current episode started 1 to 2 hours ago. The problem occurs constantly. The problem has not changed since onset.   Past Medical History  Diagnosis Date  . Hypertension   . Shortness of breath dyspnea   . Arthritis   . Hyperlipemia   . Frequent urination   . Nocturia   . Diabetes mellitus without complication    Past Surgical History  Procedure Laterality Date  . Spinal surgery      fusion  . Spinal fusion  1986  . Shoulder surgies Bilateral   . Lower back  1970  . Cholecystectomy  1998  . Tracheostomy tube placement N/A 10/01/2014    Procedure: TRACHEOSTOMY;  Surgeon: Flo Shanks, MD;  Location: [facility] OR;  Service: ENT;  Laterality: N/A;  . Direct laryngoscopy N/A 10/01/2014    Procedure: DIRECT LARYNGOSCOPY;  Surgeon: Flo Shanks, MD;  Location: [facility] OR;  Service: ENT;  Laterality: N/A;  . Anterior cervical decompression/discectomy fusion 4 levels N/A 09/23/2014    Procedure: CERVICAL THREE-FOUR,CERVICAL FOUR-FIVE,CERVICAL FIVE-SIX,CERVICAL SIX-SEVEN,ANTERIOR CERVICAL DECOMPRESSION WITH FUSION INTERBODY PROSTHESIS PLATING AND BONEGRAFT.;  Surgeon: Lisbeth Renshaw, MD;  Location: [facility] NEUR[facility] ORS;  Service: Neurosurgery;  Laterality: N/A;   No family history on file. History  Substance Use Topics  . Smoking status: Current Some Day Smoker -- 0.25 packs/day for 50 years  . Smokeless tobacco: Never Used  . Alcohol Use: No    Review of Systems  Unable to perform ROS: Patient nonverbal      Allergies  Flexeril and Tramadol  Home Medications   Prior to Admission medications   Medication Sig Start Date End Date Taking?  Authorizing Provider  Amino Acids-Protein Hydrolys (FEEDING SUPPLEMENT, PRO-STAT SUGAR FREE 64,) LIQD Take 30 mLs by mouth daily.   Yes Historical Provider, MD  antiseptic oral rinse (BIOTENE) LIQD 15 mLs by Mouth Rinse route 3 (three) times daily.   Yes Historical Provider, MD  aspirin 81 MG tablet 81 mg by Gastric Tube route daily.    Yes Historical Provider, MD  brimonidine (ALPHAGAN) 0.2 % ophthalmic solution Place 1 drop into both eyes 3 (three) times daily.   Yes Historical Provider, MD  Cholecalciferol (VITAMIN D) 2000 UNITS CAPS 2,000 Units by Gastric Tube route daily.    Yes Historical Provider, MD  diclofenac sodium (VOLTAREN) 1 % GEL Apply 2 g topically 2 (two) times daily. Apply to hands, arms and shoulders.   Yes Historical Provider, MD  enoxaparin (LOVENOX) 40 MG/0.4ML injection Inject 40 mg into the skin daily.   Yes Historical Provider, MD  famotidine (PEPCID) 20 MG tablet 20 mg by Gastric Tube route 2 (two) times daily.   Yes Historical Provider, MD  insulin aspart (NOVOLOG) 100 UNIT/ML FlexPen Inject 3 Units into the skin 3 (three) times daily with meals. 3 units for CBG greater than 150 before meals   Yes Historical Provider, MD  Insulin Glargine (LANTUS) 100 UNIT/ML Solostar Pen Inject 10 Units into the skin every evening.   Yes Historical Provider, MD  ipratropium-albuterol (DUONEB) 0.5-2.5 (3) MG/3ML SOLN Take 3 mLs by nebulization every 6 (six) hours.   Yes Historical Provider, MD  latanoprost (XALATAN) 0.005 % ophthalmic solution Place 1 drop into both eyes at bedtime.   Yes Historical Provider, MD  losartan (COZAAR) 50 MG tablet 50 mg by Gastric Tube route daily.    Yes Historical Provider, MD  Nutritional Supplements (FEEDING SUPPLEMENT, OSMOLITE 1.5 CAL,) LIQD Place 60 mLs into feeding tube 3 (three) times daily. Give 2460ml/hr Three times a day.   Yes Historical Provider, MD  Omega 3 1000 MG CAPS 2,000 mg by Gastric Tube route daily.   Yes Historical Provider, MD  oxybutynin  (DITROPAN) 5 MG tablet 5 mg by Gastric Tube route daily.    Yes Historical Provider, MD  Protein (PROSOURCE) PACK 1 each by Gastric Tube route 2 (two) times daily.   Yes Historical Provider, MD  risperiDONE (RISPERDAL) 0.5 MG tablet 0.5 mg by Gastric Tube route at bedtime.   Yes Historical Provider, MD  saccharomyces boulardii (FLORASTOR) 250 MG capsule 250 mg by Gastric Tube route 2 (two) times daily.   Yes Historical Provider, MD  sertraline (ZOLOFT) 100 MG tablet 100 mg by Gastric Tube route daily.   Yes Historical Provider, MD  chlorhexidine (PERIDEX) 0.12 % solution 15 mLs by Mouth Rinse route 2 (two) times daily. Patient not taking: Reported on 11/28/2014 10/05/14   Lisbeth RenshawNeelesh Nundkumar, MD  diazepam (VALIUM) 5 MG tablet Take 1 tablet (5 mg total) by mouth every 6 (six) hours as needed for muscle spasms. Patient not taking: Reported on 11/28/2014 10/05/14   Lisbeth RenshawNeelesh Nundkumar, MD  docusate sodium (COLACE) 100 MG capsule Take 1 capsule (100 mg total) by mouth daily as needed for mild constipation. Patient not taking: Reported on 11/28/2014 10/05/14   Lisbeth RenshawNeelesh Nundkumar, MD  gabapentin (NEURONTIN) 300 MG capsule Take 1 capsule (300 mg total) by mouth at bedtime. Patient not taking: Reported on 09/01/2014 01/01/14   Nelva Nayobert Beaton, MD  heparin 5000 UNIT/ML injection Inject 1 mL (5,000 Units total) into the skin every 8 (eight) hours. Patient not taking: Reported on 11/28/2014 10/05/14   Lisbeth RenshawNeelesh Nundkumar, MD  methylPREDNIsolone (MEDROL DOSPACK) 4 MG tablet follow package directions Patient not taking: Reported on 11/28/2014 10/05/14   Lisbeth RenshawNeelesh Nundkumar, MD  metoprolol (LOPRESSOR) 1 MG/ML injection Inject 2.5-5 mLs (2.5-5 mg total) into the vein every 3 (three) hours as needed. Patient not taking: Reported on 11/28/2014 10/05/14   Lisbeth RenshawNeelesh Nundkumar, MD  Nutritional Supplements (FEEDING SUPPLEMENT, VITAL AF 1.2 CAL,) LIQD Place 1,000 mLs into feeding tube continuous. Patient not taking: Reported on 11/28/2014 10/05/14    Lisbeth RenshawNeelesh Nundkumar, MD  senna (SENOKOT) 8.6 MG TABS tablet Take 1 tablet (8.6 mg total) by mouth daily as needed for mild constipation. Patient not taking: Reported on 11/28/2014 10/05/14   Lisbeth RenshawNeelesh Nundkumar, MD  Water For Irrigation, Sterile (FREE WATER) SOLN Place 100 mLs into feeding tube every 8 (eight) hours. Patient not taking: Reported on 12/02/2014 10/05/14   Lisbeth RenshawNeelesh Nundkumar, MD   BP 139/76 mmHg  Pulse 97  Temp(Src) 98.9 F (37.2 C) (Rectal)  Resp 18  SpO2 97% Physical Exam  Constitutional: He appears well-developed and well-nourished.  HENT:  Head: Normocephalic and atraumatic.  Eyes: Conjunctivae and EOM are normal.  Neck: Normal range of motion. Neck supple.  Cardiovascular: Normal rate, regular rhythm and normal heart sounds.   Pulmonary/Chest: Effort normal and breath sounds normal. No respiratory distress.  Abdominal: He exhibits no distension. There is no tenderness. There is no rebound and no guarding.  Musculoskeletal: Normal range of motion.  Neurological: He is alert.  Does not  follow commands, does move all extremities  Skin: Skin is warm and dry.  Vitals reviewed.   ED Course  Procedures (including critical care time) Labs Review Labs Reviewed  COMPREHENSIVE METABOLIC PANEL - Abnormal; Notable for the following:    BUN 25 (*)    Creatinine, Ser 0.49 (*)    Total Protein 8.2 (*)    Albumin 2.6 (*)    All other components within normal limits  CBC WITH DIFFERENTIAL/PLATELET - Abnormal; Notable for the following:    RBC 3.72 (*)    Hemoglobin 10.8 (*)    HCT 33.0 (*)    All other components within normal limits  URINALYSIS, ROUTINE W REFLEX MICROSCOPIC (NOT AT [facility]) - Abnormal; Notable for the following:    Color, Urine AMBER (*)    APPearance CLOUDY (*)    Protein, ur 30 (*)    Leukocytes, UA TRACE (*)    All other components within normal limits  URINE CULTURE  URINE MICROSCOPIC-ADD ON    Imaging Review Dg Chest 2 View  12/02/2014   CLINICAL DATA:   Unwitnessed fall.  EXAM: CHEST  2 VIEW  COMPARISON:  11/28/2014  FINDINGS: Tracheostomy tube is unchanged in position. The cardiomediastinal contours are unchanged, heart size is normal. There is unchanged atelectasis or scarring at the right lung base. Increased atelectasis in the left midlung zone. Lung volumes are low leading to crowding of bronchovascular structures. No pulmonary edema, pleural effusion or pneumothorax. No acute osseous abnormalities are seen.  IMPRESSION: 1. Tracheostomy tube at the thoracic inlet. 2. Right basilar and left midlung zone atelectasis.   Electronically Signed   By: Rubye Oaks M.D.   On: 12/02/2014 21:06   Dg Pelvis 1-2 Views  12/02/2014   CLINICAL DATA:  Patient found on floor. Concern for pelvic injury. Initial encounter.  EXAM: PELVIS - 1-2 VIEW  COMPARISON:  Abdominal radiograph performed 10/17/2014  FINDINGS: There is no evidence of fracture or dislocation. Both femoral heads are seated normally within their respective acetabula. No significant degenerative change is appreciated. The sacroiliac joints are unremarkable in appearance.  A relatively large amount of stool is noted in the colon.  IMPRESSION: 1. No evidence of fracture or dislocation. 2. Relatively large amount of stool noted in the colon, suggestive of mild constipation.   Electronically Signed   By: Roanna Raider M.D.   On: 12/02/2014 21:06   Ct Head Wo Contrast  12/02/2014   CLINICAL DATA:  Staff reports finding pt on the floor beside his bed. Pt is not ambulatory, h/o HTN, diabetes, spinal fusion 1986  EXAM: CT HEAD WITHOUT CONTRAST  CT CERVICAL SPINE WITHOUT CONTRAST  TECHNIQUE: Multidetector CT imaging of the head and cervical spine was performed following the standard protocol without intravenous contrast. Multiplanar CT image reconstructions of the cervical spine were also generated.  COMPARISON:  None.  FINDINGS: CT HEAD FINDINGS  No intracranial hemorrhage. No parenchymal contusion. No midline  shift or mass effect. Basilar cisterns are patent. No skull base fracture. No fluid in the paranasal sinuses or mastoid air cells. Orbits are normal.  Visualized cortical atrophy and mild ventricular dilatation. Mild periventricular subcortical white matter hypodensities.  CT CERVICAL SPINE FINDINGS  Anterior cervical fusion from C3-C7. No acute loss vertebral body height and disc height. Normal alignment cervical vertebral bodies. Normal facet articulation. No epidural paraspinal hematoma. Tracheostomy tube noted.  IMPRESSION: 1. No acute intracranial trauma. 2. Atrophy and microvascular disease. 3. No cervical spine fracture. 4. Stable anterior cervical  fusion.   Electronically Signed   By: Genevive Bi M.D.   On: 12/02/2014 20:28   Ct Cervical Spine Wo Contrast  12/02/2014   : CLINICAL DATA: Staff reports finding pt on the floor beside his bed. Pt is not ambulatory, h/o HTN, diabetes, spinal fusion 1986  EXAM: CT HEAD WITHOUT CONTRAST  CT CERVICAL SPINE WITHOUT CONTRAST  TECHNIQUE: Multidetector CT imaging of the head and cervical spine was performed following the standard protocol without intravenous contrast. Multiplanar CT image reconstructions of the cervical spine were also generated.  COMPARISON: None.  FINDINGS: CT HEAD FINDINGS  No intracranial hemorrhage. No parenchymal contusion. No midline shift or mass effect. Basilar cisterns are patent. No skull base fracture. No fluid in the paranasal sinuses or mastoid air cells. Orbits are normal.  Visualized cortical atrophy and mild ventricular dilatation. Mild periventricular subcortical white matter hypodensities.  CT CERVICAL SPINE FINDINGS  Anterior cervical fusion from C3-C7. No acute loss vertebral body height and disc height. Normal alignment cervical vertebral bodies. Normal facet articulation. No epidural paraspinal hematoma. Tracheostomy tube noted.  IMPRESSION: 1. No acute intracranial trauma. 2. Atrophy and microvascular disease. 3. No  cervical spine fracture. 4. Stable anterior cervical fusion.   Electronically Signed   By: Genevive Bi M.D.   On: 12/02/2014 22:49     EKG Interpretation None      MDM   Final diagnoses:  Fall    82 y.o. male with pertinent PMH of complicated postoperative course of coding after ACDF, baseline confusion with only intermittent following of commands, HTN, DM presents after being found on the floor of his SNF.  I spoke with the patient's family who stated that this was normal for the patient.  No signs of trauma on my exam.  Wu unremarkable.    DC home in stable condition  I have reviewed all laboratory and imaging studies if ordered as above  1. Nicholaus Bloom, MD 12/03/14 1610  Mirian Mo, MD 12/03/14 0100  CT scan of head necessary to ro traumatic SDH or other worsening pathology given baseline ho dementia and potential for catastrophic intracranial injury.   Late addendum for clarification regarding medical necessity of CT head  Mirian Mo, MD 12/09/14 [phone]

## 2014-12-02 NOTE — Discharge Instructions (Signed)

## 2014-12-03 NOTE — ED Notes (Signed)
Dispatch called for update on [facility] for [facility], [facility] extremely busy, pt is 2nd in line for transport

## 2014-12-03 NOTE — ED Notes (Signed)
Respiratory at bedside to suction patient

## 2014-12-03 NOTE — ED Notes (Signed)
Pt repositioned,  And blanket provided

## 2014-12-03 NOTE — ED Notes (Signed)
[facility] called for transport.  

## 2014-12-04 LAB — URINE CULTURE
CULTURE: NO GROWTH
Colony Count: NO GROWTH

## 2014-12-05 ENCOUNTER — Non-Acute Institutional Stay (SKILLED_NURSING_FACILITY): Payer: Medicare Other | Admitting: Internal Medicine

## 2014-12-05 ENCOUNTER — Encounter: Payer: Self-pay | Admitting: Internal Medicine

## 2014-12-05 DIAGNOSIS — Z9889 Other specified postprocedural states: Secondary | ICD-10-CM | POA: Diagnosis not present

## 2014-12-05 DIAGNOSIS — Z931 Gastrostomy status: Secondary | ICD-10-CM | POA: Diagnosis not present

## 2014-12-05 DIAGNOSIS — W19XXXA Unspecified fall, initial encounter: Secondary | ICD-10-CM

## 2014-12-05 DIAGNOSIS — Z930 Tracheostomy status: Secondary | ICD-10-CM | POA: Diagnosis not present

## 2014-12-05 DIAGNOSIS — Z794 Long term (current) use of insulin: Secondary | ICD-10-CM | POA: Diagnosis not present

## 2014-12-05 DIAGNOSIS — I10 Essential (primary) hypertension: Secondary | ICD-10-CM | POA: Diagnosis not present

## 2014-12-05 DIAGNOSIS — E118 Type 2 diabetes mellitus with unspecified complications: Secondary | ICD-10-CM | POA: Diagnosis not present

## 2014-12-05 NOTE — Progress Notes (Signed)
Patient ID: Ricardo Rivera, male   DOB: [birth year], 81 y.o.   MRN: [id number]    DATE: 12/05/14  Location:  [place] of Service: SNF [phone])   Extended Emergency Contact Information Primary Emergency Contact: Garth Schlatter Address: [address]          [place], [place] [address] Johnnette Litter of [place] Phone: [phone] Relation: Spouse Secondary Emergency Contact: Tracey,D Address: step daughter  Johnnette Litter of [place] Phone: [phone] Relation: Other  Advanced Directive information   FULL CODE; MOST FORM ON CHART  Chief Complaint  Patient presents with  . Follow-up    HPI:  81 yo male seen today for ER f/u. He was sent to the ED on June 3rd due to fall. No obvious injuries found and was dc/d back to SNF.   DM - CBG 112 today and ranges 90-130s. No low BS reactions. He takes lantus and SSI. He also takes ASA daily  Hyperlipidemia - currently on fish oil but nursing unable to crush med to give via Peg and request to stop med as there is no substitute  HTN - BP controlled on losartan  Arthritis/ s/p cervical discectomy - pain stable on voltaren gel  SOB with respiratory failure s/p trach - stable. He has prn duonebs ordered.   GERD/FTT s/p trach - gets ATC osmolite 1.5 as well as nutritional supplements. He takes floraster due to recent abx use for pneumonia. He is on pepcid  Mood - stable on sertraline  he is on lovenox for DVT prophylaxis  He uses eye gtts for glaucoma  Bladder/nocturia - sx's stable on ditropan  Past Medical History  Diagnosis Date  . Hypertension   . Shortness of breath dyspnea   . Arthritis   . Hyperlipemia   . Frequent urination   . Nocturia   . Diabetes mellitus without complication     Past Surgical History  Procedure Laterality Date  . Spinal surgery      fusion  . Spinal fusion  1986  . Shoulder surgies Bilateral   . Lower back  1970  . Cholecystectomy  1998  . Tracheostomy tube  placement N/A 10/01/2014    Procedure: TRACHEOSTOMY;  Surgeon: Jodi Marble, MD;  Location: [facility];  Service: ENT;  Laterality: N/A;  . Direct laryngoscopy N/A 10/01/2014    Procedure: DIRECT LARYNGOSCOPY;  Surgeon: Jodi Marble, MD;  Location: [facility];  Service: ENT;  Laterality: N/A;  . Anterior cervical decompression/discectomy fusion 4 levels N/A 09/23/2014    Procedure: CERVICAL THREE-FOUR,CERVICAL FOUR-FIVE,CERVICAL FIVE-SIX,CERVICAL SIX-SEVEN,ANTERIOR CERVICAL DECOMPRESSION WITH FUSION INTERBODY PROSTHESIS PLATING AND BONEGRAFT.;  Surgeon: Consuella Lose, MD;  Location: [facility];  Service: Neurosurgery;  Laterality: N/A;    Patient Care Team: Lona Kettle, MD as PCP - General (Family Medicine)  History   Social History  . Marital Status: Married    Spouse Name: N/A  . Number of Children: N/A  . Years of Education: N/A   Occupational History  . Not on file.   Social History Main Topics  . Smoking status: Current Some Day Smoker -- 0.25 packs/day for 50 years  . Smokeless tobacco: Never Used  . Alcohol Use: No  . Drug Use: No  . Sexual Activity: Not on file   Other Topics Concern  . Not on file   Social History Narrative     reports that he has been smoking.  He has never used smokeless tobacco. He reports  that he does not drink alcohol or use illicit drugs.   There is no immunization history on file for this patient.  Allergies  Allergen Reactions  . Flexeril [Cyclobenzaprine] Other (See Comments)    Hallucinations  . Tramadol Other (See Comments)    Hallunications    Medications: Patient's Medications  New Prescriptions   No medications on file  Previous Medications   AMINO ACIDS-PROTEIN HYDROLYS (FEEDING SUPPLEMENT, PRO-STAT SUGAR FREE 64,) LIQD    Take 30 mLs by mouth daily.   ANTISEPTIC ORAL RINSE (BIOTENE) LIQD    15 mLs by Mouth Rinse route 3 (three) times daily.   ASPIRIN 81 MG TABLET    81 mg by Gastric Tube route daily.    BRIMONIDINE (ALPHAGAN)  0.2 % OPHTHALMIC SOLUTION    Place 1 drop into both eyes 3 (three) times daily.   CHLORHEXIDINE (PERIDEX) 0.12 % SOLUTION    15 mLs by Mouth Rinse route 2 (two) times daily.   CHOLECALCIFEROL (VITAMIN D) 2000 UNITS CAPS    2,000 Units by Gastric Tube route daily.    DIAZEPAM (VALIUM) 5 MG TABLET    Take 1 tablet (5 mg total) by mouth every 6 (six) hours as needed for muscle spasms.   DICLOFENAC SODIUM (VOLTAREN) 1 % GEL    Apply 2 g topically 2 (two) times daily. Apply to hands, arms and shoulders.   DOCUSATE SODIUM (COLACE) 100 MG CAPSULE    Take 1 capsule (100 mg total) by mouth daily as needed for mild constipation.   ENOXAPARIN (LOVENOX) 40 MG/0.4ML INJECTION    Inject 40 mg into the skin daily.   FAMOTIDINE (PEPCID) 20 MG TABLET    20 mg by Gastric Tube route 2 (two) times daily.   GABAPENTIN (NEURONTIN) 300 MG CAPSULE    Take 1 capsule (300 mg total) by mouth at bedtime.   HEPARIN 5000 UNIT/ML INJECTION    Inject 1 mL (5,000 Units total) into the skin every 8 (eight) hours.   INSULIN ASPART (NOVOLOG) 100 UNIT/ML FLEXPEN    Inject 3 Units into the skin 3 (three) times daily with meals. 3 units for CBG greater than 150 before meals   INSULIN GLARGINE (LANTUS) 100 UNIT/ML SOLOSTAR PEN    Inject 10 Units into the skin every evening.   IPRATROPIUM-ALBUTEROL (DUONEB) 0.5-2.5 (3) MG/3ML SOLN    Take 3 mLs by nebulization every 6 (six) hours.   LATANOPROST (XALATAN) 0.005 % OPHTHALMIC SOLUTION    Place 1 drop into both eyes at bedtime.   LOSARTAN (COZAAR) 50 MG TABLET    50 mg by Gastric Tube route daily.    METHYLPREDNISOLONE (MEDROL DOSPACK) 4 MG TABLET    follow package directions   METOPROLOL (LOPRESSOR) 1 MG/ML INJECTION    Inject 2.5-5 mLs (2.5-5 mg total) into the vein every 3 (three) hours as needed.   NUTRITIONAL SUPPLEMENTS (FEEDING SUPPLEMENT, OSMOLITE 1.5 CAL,) LIQD    Place 60 mLs into feeding tube 3 (three) times daily. Give 60m/hr Three times a day.   NUTRITIONAL SUPPLEMENTS (FEEDING  SUPPLEMENT, VITAL AF 1.2 CAL,) LIQD    Place 1,000 mLs into feeding tube continuous.   OMEGA 3 1000 MG CAPS    2,000 mg by Gastric Tube route daily.   OXYBUTYNIN (DITROPAN) 5 MG TABLET    5 mg by Gastric Tube route daily.    PROTEIN (PROSOURCE) PACK    1 each by Gastric Tube route 2 (two) times daily.   RISPERIDONE (RISPERDAL) 0.5 MG TABLET  0.5 mg by Gastric Tube route at bedtime.   SACCHAROMYCES BOULARDII (FLORASTOR) 250 MG CAPSULE    250 mg by Gastric Tube route 2 (two) times daily.   SENNA (SENOKOT) 8.6 MG TABS TABLET    Take 1 tablet (8.6 mg total) by mouth daily as needed for mild constipation.   SERTRALINE (ZOLOFT) 100 MG TABLET    100 mg by Gastric Tube route daily.   WATER FOR IRRIGATION, STERILE (FREE WATER) SOLN    Place 100 mLs into feeding tube every 8 (eight) hours.  Modified Medications   No medications on file  Discontinued Medications   No medications on file    Review of Systems  Unable to perform ROS: Patient nonverbal    Filed Vitals:   12/05/14 1759  BP: 127/68  Pulse: 95  Temp: 99.2 F (37.3 C)  SpO2: 91%   There is no weight on file to calculate BMI.  Physical Exam  Constitutional: He appears well-developed.  Lying in bed in NAD. Frail appearing  HENT:  Mouth/Throat: Oropharynx is clear and moist.  Eyes: Pupils are equal, round, and reactive to light. No scleral icterus.  Neck: Neck supple. Carotid bruit is not present. No tracheal deviation present.  Trach collar intact. No purulent d/c  Cardiovascular: Normal rate, regular rhythm and intact distal pulses.  Exam reveals no gallop and no friction rub.   Murmur (1/6 SEM) heard. no distal LE swelling. No calf TTP  Pulmonary/Chest: Effort normal and breath sounds normal. No stridor. He has no wheezes. He has no rales. He exhibits no tenderness.  Lungs clear anteriorly  Abdominal: Soft. Bowel sounds are normal. He exhibits no distension, no abdominal bruit, no pulsatile midline mass and no mass. There is  no tenderness. There is no rebound and no guarding.  (+) PEG intact with no redness at insertion site  Lymphadenopathy:    He has no cervical adenopathy.  Neurological: He is alert.  Skin: Skin is warm and dry. No rash noted.  Psychiatric: He has a normal mood and affect. His behavior is normal.     Labs reviewed: Admission on 12/02/2014, Discharged on 12/03/2014  Component Date Value Ref Range Status  . Sodium 12/02/2014 137  135 - 145 mmol/L Final  . Potassium 12/02/2014 3.5  3.5 - 5.1 mmol/L Final  . Chloride 12/02/2014 103  101 - 111 mmol/L Final  . CO2 12/02/2014 24  22 - 32 mmol/L Final  . Glucose, Bld 12/02/2014 90  65 - 99 mg/dL Final  . BUN 12/02/2014 25* 6 - 20 mg/dL Final  . Creatinine, Ser 12/02/2014 0.49* 0.61 - 1.24 mg/dL Final  . Calcium 12/02/2014 9.0  8.9 - 10.3 mg/dL Final  . Total Protein 12/02/2014 8.2* 6.5 - 8.1 g/dL Final  . Albumin 12/02/2014 2.6* 3.5 - 5.0 g/dL Final  . AST 12/02/2014 21  15 - 41 U/L Final  . ALT 12/02/2014 29  17 - 63 U/L Final  . Alkaline Phosphatase 12/02/2014 93  38 - 126 U/L Final  . Total Bilirubin 12/02/2014 0.7  0.3 - 1.2 mg/dL Final  . GFR calc non Af Amer 12/02/2014 >60  >60 mL/min Final  . GFR calc Af Amer 12/02/2014 >60  >60 mL/min Final   Comment: (NOTE) The eGFR has been calculated using the CKD EPI equation. This calculation has not been validated in all clinical situations. eGFR's persistently <60 mL/min signify possible Chronic Kidney Disease.   . Anion gap 12/02/2014 10  5 - 15 Final  .  WBC 12/02/2014 9.2  4.0 - 10.5 K/uL Final  . RBC 12/02/2014 3.72* 4.22 - 5.81 MIL/uL Final  . Hemoglobin 12/02/2014 10.8* 13.0 - 17.0 g/dL Final  . HCT 12/02/2014 33.0* 39.0 - 52.0 % Final  . MCV 12/02/2014 88.7  78.0 - 100.0 fL Final  . MCH 12/02/2014 29.0  26.0 - 34.0 pg Final  . MCHC 12/02/2014 32.7  30.0 - 36.0 g/dL Final  . RDW 12/02/2014 14.5  11.5 - 15.5 % Final  . Platelets 12/02/2014 285  150 - 400 K/uL Final  .  Neutrophils Relative % 12/02/2014 62  43 - 77 % Final  . Neutro Abs 12/02/2014 5.7  1.7 - 7.7 K/uL Final  . Lymphocytes Relative 12/02/2014 29  12 - 46 % Final  . Lymphs Abs 12/02/2014 2.7  0.7 - 4.0 K/uL Final  . Monocytes Relative 12/02/2014 7  3 - 12 % Final  . Monocytes Absolute 12/02/2014 0.7  0.1 - 1.0 K/uL Final  . Eosinophils Relative 12/02/2014 2  0 - 5 % Final  . Eosinophils Absolute 12/02/2014 0.2  0.0 - 0.7 K/uL Final  . Basophils Relative 12/02/2014 0  0 - 1 % Final  . Basophils Absolute 12/02/2014 0.0  0.0 - 0.1 K/uL Final  . Color, Urine 12/02/2014 AMBER* YELLOW Final   BIOCHEMICALS MAY BE AFFECTED BY COLOR  . APPearance 12/02/2014 CLOUDY* CLEAR Final  . Specific Gravity, Urine 12/02/2014 1.023  1.005 - 1.030 Final  . pH 12/02/2014 8.0  5.0 - 8.0 Final  . Glucose, UA 12/02/2014 NEGATIVE  NEGATIVE mg/dL Final  . Hgb urine dipstick 12/02/2014 NEGATIVE  NEGATIVE Final  . Bilirubin Urine 12/02/2014 NEGATIVE  NEGATIVE Final  . Ketones, ur 12/02/2014 NEGATIVE  NEGATIVE mg/dL Final  . Protein, ur 12/02/2014 30* NEGATIVE mg/dL Final  . Urobilinogen, UA 12/02/2014 1.0  0.0 - 1.0 mg/dL Final  . Nitrite 12/02/2014 NEGATIVE  NEGATIVE Final  . Leukocytes, UA 12/02/2014 TRACE* NEGATIVE Final  . Specimen Description 12/02/2014 URINE, CATHETERIZED   Final  . Special Requests 12/02/2014 NONE   Final  . Colony Count 12/02/2014    Final                   Value:NO GROWTH Performed at [facility]   . Culture 12/02/2014    Final                   Value:NO GROWTH Performed at [facility]   . Report Status 12/02/2014 12/04/2014 FINAL   Final  . WBC, UA 12/02/2014 0-2  <3 WBC/hpf Final  . Urine-Other 12/02/2014 RARE YEAST   Final   AMORPHOUS URATES/PHOSPHATES  Admission on 10/05/2014, Discharged on 11/21/2014  No results displayed because visit has over 200 results.    Admission on 09/23/2014, Discharged on 10/05/2014  No results displayed because visit has over 200  results.    Hospital Outpatient Visit on 09/05/2014  Component Date Value Ref Range Status  . MRSA, PCR 09/05/2014 NEGATIVE  NEGATIVE Final  . Staphylococcus aureus 09/05/2014 NEGATIVE  NEGATIVE Final   Comment:        The Xpert SA Assay (FDA approved for NASAL specimens in patients over 29 years of age), is one component of a comprehensive surveillance program.  Test performance has been validated by [facility] for patients greater than or equal to 18 year old. It is not intended to diagnose infection nor to guide or monitor treatment.   . Sodium 09/05/2014 134*  135 - 145 mmol/L Final  . Potassium 09/05/2014 4.2  3.5 - 5.1 mmol/L Final  . Chloride 09/05/2014 98  96 - 112 mmol/L Final  . CO2 09/05/2014 27  19 - 32 mmol/L Final  . Glucose, Bld 09/05/2014 79  70 - 99 mg/dL Final  . BUN 09/05/2014 13  6 - 23 mg/dL Final  . Creatinine, Ser 09/05/2014 0.88  0.50 - 1.35 mg/dL Final  . Calcium 09/05/2014 9.4  8.4 - 10.5 mg/dL Final  . GFR calc non Af Amer 09/05/2014 80* >90 mL/min Final  . GFR calc Af Amer 09/05/2014 >90  >90 mL/min Final   Comment: (NOTE) The eGFR has been calculated using the CKD EPI equation. This calculation has not been validated in all clinical situations. eGFR's persistently <90 mL/min signify possible Chronic Kidney Disease.   . Anion gap 09/05/2014 9  5 - 15 Final  . WBC 09/05/2014 8.9  4.0 - 10.5 K/uL Final  . RBC 09/05/2014 4.42  4.22 - 5.81 MIL/uL Final  . Hemoglobin 09/05/2014 13.1  13.0 - 17.0 g/dL Final  . HCT 09/05/2014 37.9* 39.0 - 52.0 % Final  . MCV 09/05/2014 85.7  78.0 - 100.0 fL Final  . MCH 09/05/2014 29.6  26.0 - 34.0 pg Final  . MCHC 09/05/2014 34.6  30.0 - 36.0 g/dL Final  . RDW 09/05/2014 13.8  11.5 - 15.5 % Final  . Platelets 09/05/2014 250  150 - 400 K/uL Final    Dg Chest 2 View  12/02/2014   CLINICAL DATA:  Unwitnessed fall.  EXAM: CHEST  2 VIEW  COMPARISON:  11/28/2014  FINDINGS: Tracheostomy tube is unchanged in position. The  cardiomediastinal contours are unchanged, heart size is normal. There is unchanged atelectasis or scarring at the right lung base. Increased atelectasis in the left midlung zone. Lung volumes are low leading to crowding of bronchovascular structures. No pulmonary edema, pleural effusion or pneumothorax. No acute osseous abnormalities are seen.  IMPRESSION: 1. Tracheostomy tube at the thoracic inlet. 2. Right basilar and left midlung zone atelectasis.   Electronically Signed   By: Jeb Levering M.D.   On: 12/02/2014 21:06   Dg Pelvis 1-2 Views  12/02/2014   CLINICAL DATA:  Patient found on floor. Concern for pelvic injury. Initial encounter.  EXAM: PELVIS - 1-2 VIEW  COMPARISON:  Abdominal radiograph performed 10/17/2014  FINDINGS: There is no evidence of fracture or dislocation. Both femoral heads are seated normally within their respective acetabula. No significant degenerative change is appreciated. The sacroiliac joints are unremarkable in appearance.  A relatively large amount of stool is noted in the colon.  IMPRESSION: 1. No evidence of fracture or dislocation. 2. Relatively large amount of stool noted in the colon, suggestive of mild constipation.   Electronically Signed   By: Garald Balding M.D.   On: 12/02/2014 21:06   Ct Head Wo Contrast  12/02/2014   CLINICAL DATA:  Staff reports finding pt on the floor beside his bed. Pt is not ambulatory, h/o HTN, diabetes, spinal fusion 1986  EXAM: CT HEAD WITHOUT CONTRAST  CT CERVICAL SPINE WITHOUT CONTRAST  TECHNIQUE: Multidetector CT imaging of the head and cervical spine was performed following the standard protocol without intravenous contrast. Multiplanar CT image reconstructions of the cervical spine were also generated.  COMPARISON:  None.  FINDINGS: CT HEAD FINDINGS  No intracranial hemorrhage. No parenchymal contusion. No midline shift or mass effect. Basilar cisterns are patent. No skull base fracture. No fluid in the paranasal  sinuses or mastoid air  cells. Orbits are normal.  Visualized cortical atrophy and mild ventricular dilatation. Mild periventricular subcortical white matter hypodensities.  CT CERVICAL SPINE FINDINGS  Anterior cervical fusion from C3-C7. No acute loss vertebral body height and disc height. Normal alignment cervical vertebral bodies. Normal facet articulation. No epidural paraspinal hematoma. Tracheostomy tube noted.  IMPRESSION: 1. No acute intracranial trauma. 2. Atrophy and microvascular disease. 3. No cervical spine fracture. 4. Stable anterior cervical fusion.   Electronically Signed   By: Suzy Bouchard M.D.   On: 12/02/2014 20:28   Ct Cervical Spine Wo Contrast  12/02/2014   : CLINICAL DATA: Staff reports finding pt on the floor beside his bed. Pt is not ambulatory, h/o HTN, diabetes, spinal fusion 1986  EXAM: CT HEAD WITHOUT CONTRAST  CT CERVICAL SPINE WITHOUT CONTRAST  TECHNIQUE: Multidetector CT imaging of the head and cervical spine was performed following the standard protocol without intravenous contrast. Multiplanar CT image reconstructions of the cervical spine were also generated.  COMPARISON: None.  FINDINGS: CT HEAD FINDINGS  No intracranial hemorrhage. No parenchymal contusion. No midline shift or mass effect. Basilar cisterns are patent. No skull base fracture. No fluid in the paranasal sinuses or mastoid air cells. Orbits are normal.  Visualized cortical atrophy and mild ventricular dilatation. Mild periventricular subcortical white matter hypodensities.  CT CERVICAL SPINE FINDINGS  Anterior cervical fusion from C3-C7. No acute loss vertebral body height and disc height. Normal alignment cervical vertebral bodies. Normal facet articulation. No epidural paraspinal hematoma. Tracheostomy tube noted.  IMPRESSION: 1. No acute intracranial trauma. 2. Atrophy and microvascular disease. 3. No cervical spine fracture. 4. Stable anterior cervical fusion.   Electronically Signed   By: Suzy Bouchard M.D.   On: 12/02/2014  22:49   Dg Chest Portable 1 View  11/28/2014   CLINICAL DATA:  Tracheostomy tube change  EXAM: PORTABLE CHEST - 1 VIEW  COMPARISON:  11/05/2014  FINDINGS: Tracheostomy tube tip is above the carina. The heart size appears normal. Atelectasis is noted within the right lung base.  IMPRESSION: 1. Satisfactory position of tracheostomy tube with tip above carina. 2. Right base atelectasis.   Electronically Signed   By: Kerby Moors M.D.   On: 11/28/2014 07:32     Assessment/Plan   ICD-9-CM ICD-10-CM   1. Fall, initial encounter [id number] W19.XXXA   2. S/P cervical discectomy V45.89 Z98.89   3. Tracheostomy status V44.0 Z93.0   4. PEG (percutaneous endoscopic gastrostomy) status due to dysphagia and FTT V44.1 Z93.1   5.      Essential HTN 6.      DM type 2 with complication, long term insulin use  --d/c fish oil as no liquid exists to put in Peg and unable to crush med  --continue other meds as ordered  --PT/OT/ST as indicated  --fall precautions  --trach and Peg care as indicated  --will follow  Maisy Newport S. Perlie Gold  [facility] and [facility] [address] [place], [place] [address] [phone] Cell (Monday-Friday 8 AM - 5 PM) [phone] After 5 PM and follow prompts

## 2014-12-08 ENCOUNTER — Non-Acute Institutional Stay (SKILLED_NURSING_FACILITY): Payer: Medicare Other | Admitting: Adult Health

## 2014-12-08 DIAGNOSIS — R443 Hallucinations, unspecified: Secondary | ICD-10-CM

## 2014-12-08 DIAGNOSIS — J9620 Acute and chronic respiratory failure, unspecified whether with hypoxia or hypercapnia: Secondary | ICD-10-CM

## 2014-12-08 DIAGNOSIS — R451 Restlessness and agitation: Secondary | ICD-10-CM | POA: Diagnosis not present

## 2014-12-13 ENCOUNTER — Encounter (HOSPITAL_COMMUNITY): Payer: Self-pay | Admitting: Emergency Medicine

## 2014-12-13 ENCOUNTER — Emergency Department (HOSPITAL_COMMUNITY)
Admission: EM | Admit: 2014-12-13 | Discharge: 2014-12-13 | Disposition: A | Discharge status: Home / Self Care | Payer: Medicare Other | Attending: Emergency Medicine | Admitting: Emergency Medicine

## 2014-12-13 ENCOUNTER — Emergency Department (HOSPITAL_COMMUNITY): Payer: Medicare Other

## 2014-12-13 DIAGNOSIS — R451 Restlessness and agitation: Secondary | ICD-10-CM | POA: Insufficient documentation

## 2014-12-13 DIAGNOSIS — M1990 Unspecified osteoarthritis, unspecified site: Secondary | ICD-10-CM | POA: Diagnosis not present

## 2014-12-13 DIAGNOSIS — Z720 Tobacco use: Secondary | ICD-10-CM | POA: Diagnosis not present

## 2014-12-13 DIAGNOSIS — Z430 Encounter for attention to tracheostomy: Secondary | ICD-10-CM

## 2014-12-13 DIAGNOSIS — Z794 Long term (current) use of insulin: Secondary | ICD-10-CM | POA: Diagnosis not present

## 2014-12-13 DIAGNOSIS — Z7982 Long term (current) use of aspirin: Secondary | ICD-10-CM | POA: Diagnosis not present

## 2014-12-13 DIAGNOSIS — I10 Essential (primary) hypertension: Secondary | ICD-10-CM | POA: Insufficient documentation

## 2014-12-13 DIAGNOSIS — E119 Type 2 diabetes mellitus without complications: Secondary | ICD-10-CM | POA: Diagnosis not present

## 2014-12-13 DIAGNOSIS — Z79899 Other long term (current) drug therapy: Secondary | ICD-10-CM | POA: Diagnosis not present

## 2014-12-13 MED ORDER — RISPERIDONE 0.5 MG PO TBDP
0.5000 mg | ORAL_TABLET | Freq: Every day | ORAL | Status: DC
  Administered 2014-12-13: 0.5 mg via ORAL
  Filled 2014-12-13 (×3): qty 1

## 2014-12-13 MED ORDER — HALOPERIDOL 1 MG PO TABS
1.0000 mg | ORAL_TABLET | Freq: Once | ORAL | Status: AC
  Administered 2014-12-13: 1 mg via ORAL
  Filled 2014-12-13: qty 1

## 2014-12-13 NOTE — ED Notes (Signed)
Nurse, mental health given report and EMS called for transportation

## 2014-12-13 NOTE — Discharge Instructions (Signed)
Patient's tracheostomy tube was replaced.  Patient is restless, and frequently pulls at tubes and devices.  He can be redirected when spoken to, or if he has sitter at the bedside.  We found hand mitts helped to keep him from pullinga t tubes as well.  Please have patient evaluated by nursing home physician in the morning for possible medication changes to help with agitation.   Care of a Tracheostomy Tube Keeping the tracheostomy tube clean helps prevent infections and keeps certain trach tubing from plugging. A tracheostomy tube is commonly known as a trach tube. You may have one tube (an outer cannula), or you may have two tubes (an outer and inner cannula). The inner cannula that fits inside the outer cannula is removed for cleaning or replacement. Follow your caregiver's directions as to how often you should change and clean your trach tube.  SUPPLIES NEEDED  Towel.  Suction supplies.  Sterile trach care kit.  4x4 inch (10x10 cm) gauze pads.  Sterile cotton-tipped swabs.  Sterile trach bandage (dressing).  Sterile container.  0.9% saline solution.  Small sterile brush (or disposable inner cannula).  Roll of twill tape, trach ties, or trach holder.  Scissors.  Clean gloves.  Sterile gloves. TRACHEOSTOMY CARE  1. Have all supplies ready and available. 2. Wash hands well. 3. Put on clean gloves. 4. Suction the trach tube as needed. 5. When suctioning is complete, remove soiled trach dressings, gloves, and suction catheter. Throw away the dressings and coiled catheter in the glove. To coil the catheter, roll the catheter around the fingers. Then, pull the glove off inside out so that catheter remains coiled in glove. 6. Wash hands well. 7. Put on sterile gloves. 8. Fill a container 0.9% saline solution. 9. Give oxygen as needed. 10. Clean the inner cannula. Nondisposable inner cannula.  While only touching the outer part of the trach tube, unlock and remove the inner  cannula.  Drop the inner cannula into 0.9% saline solution. The saline will loosen secretions.  Replace the trach collar, trach tube, or ventilator oxygen source over the outer cannula. Do not attach the trach tube and ventilator oxygen devices to all outer cannulas when the inner cannula is removed.  Quickly pick up the inner cannula out of the saline solution. Use a small brush to remove the secretions on the inside and outside of the inner cannula.  Hold the inner cannula over the container. Rinse the cannula with 0.9% saline solution.  Replace the inner cannula. Secure the locking mechanism.  Give oxygen as needed. Disposable inner cannula.  Remove the new cannula from the packaging.  Take out the inner cannula while touching only the outer part of the trach tube.  Replace the old cannula with the new cannula. Lock it into position.  Throw away the old cannula.  Give oxygen as needed. 11. Clean the outer cannula surfaces with gauze or cotton swabs, extending 2-4 inches (5-10 cm) in all directions under the neck plate. Using a cotton swab, clean the stoma site in a circular motion from the stoma site outward. 12. Dry the skin and the outer cannula by patting the area gently with a dry gauze pad. 13. Secure the trach tube with trach ties or a trach tube holder. 14. Place a sterile dressing around the trach site. 15. Give oxygen as needed. 16. Throw away any used supplies. 17. Remove gloves. 18. Wash hands well. Document Released: 07/30/2006 Document Revised: 06/03/2012 Document Reviewed: 01/24/2012 Exit[employer] Patient Information 2015  [employer]. This information is not intended to replace advice given to you by your health care provider. Make sure you discuss any questions you have with your health care provider. ° °

## 2014-12-13 NOTE — ED Notes (Signed)
Bed: RESA Expected date: 12/13/14 Expected time: 12:21 AM Means of arrival: Ambulance Comments: Combative towards staff, pulled trach out

## 2014-12-13 NOTE — Progress Notes (Signed)
RN called RT about patient pulling out his trach at his place of residence. RT tried to place trach back in and was unable. MD placed trach back in with good color change on end tidal. Patient is tolerating well. RT placed patient on 28% ATC on medical air.RT will continue to monitor.

## 2014-12-13 NOTE — ED Notes (Signed)
Pt is from [facility] and per report became aggressive today and pulled out his trach. No respiratory distress. Aggitated but not aggressive at this time. Alert and oriented per norm.

## 2014-12-13 NOTE — ED Provider Notes (Signed)
CSN: [id number]     Arrival date & time 12/13/14  0029 History   First MD Initiated Contact with Patient 12/13/14 0040     Chief Complaint  Patient presents with  . Tracheostomy Tube Change  . Aggressive Behavior     (Consider location/radiation/quality/duration/timing/severity/associated sxs/prior Treatment) HPI 81 year old male presents to emergency department via EMS from his nursing facility after pulling out his trach tube.  Patient had an anterior cervical decompression/discectomy fusion of 4 levels in March, and due to complications after the surgery required tracheostomy and G-tube placement.  Patient reportedly combative with nursing staff.  Patient unable to give history at this time.  Patient seems upset and agitated, but can be calmed.  By notes prior to surgery, patient was verbal and cognizant prior to surgery.  It is unclear when he did developed encephalopathy.  He has had a prior ED visit for self removal of trach and a fall at the nursing facility. Past Medical History  Diagnosis Date  . Hypertension   . Shortness of breath dyspnea   . Arthritis   . Hyperlipemia   . Frequent urination   . Nocturia   . Diabetes mellitus without complication    Past Surgical History  Procedure Laterality Date  . Spinal surgery      fusion  . Spinal fusion  1986  . Shoulder surgies Bilateral   . Lower back  1970  . Cholecystectomy  1998  . Tracheostomy tube placement N/A 10/01/2014    Procedure: TRACHEOSTOMY;  Surgeon: Flo Shanks, MD;  Location: [facility] OR;  Service: ENT;  Laterality: N/A;  . Direct laryngoscopy N/A 10/01/2014    Procedure: DIRECT LARYNGOSCOPY;  Surgeon: Flo Shanks, MD;  Location: [facility] OR;  Service: ENT;  Laterality: N/A;  . Anterior cervical decompression/discectomy fusion 4 levels N/A 09/23/2014    Procedure: CERVICAL THREE-FOUR,CERVICAL FOUR-FIVE,CERVICAL FIVE-SIX,CERVICAL SIX-SEVEN,ANTERIOR CERVICAL DECOMPRESSION WITH FUSION INTERBODY PROSTHESIS PLATING AND  BONEGRAFT.;  Surgeon: Lisbeth Renshaw, MD;  Location: [facility];  Service: Neurosurgery;  Laterality: N/A;   History reviewed. No pertinent family history. History  Substance Use Topics  . Smoking status: Current Some Day Smoker -- 0.25 packs/day for 50 years  . Smokeless tobacco: Never Used  . Alcohol Use: No    Review of Systems Level V caveat, patient nonverbal   Allergies  Flexeril and Tramadol  Home Medications   Prior to Admission medications   Medication Sig Start Date End Date Taking? Authorizing Provider  Amino Acids-Protein Hydrolys (FEEDING SUPPLEMENT, PRO-STAT SUGAR FREE 64,) LIQD Take 30 mLs by mouth daily.   Yes Historical Provider, MD  antiseptic oral rinse (BIOTENE) LIQD 15 mLs by Mouth Rinse route 3 (three) times daily.   Yes Historical Provider, MD  aspirin 81 MG tablet 81 mg by Gastric Tube route daily.    Yes Historical Provider, MD  brimonidine (ALPHAGAN) 0.2 % ophthalmic solution Place 1 drop into both eyes 3 (three) times daily.   Yes Historical Provider, MD  Cholecalciferol (VITAMIN D) 2000 UNITS CAPS 2,000 Units by Gastric Tube route daily.    Yes Historical Provider, MD  diclofenac sodium (VOLTAREN) 1 % GEL Apply 2 g topically 2 (two) times daily. Apply to hands, arms and shoulders.   Yes Historical Provider, MD  divalproex (DEPAKOTE SPRINKLE) 125 MG capsule Take 125 mg by mouth 2 (two) times daily.   Yes Historical Provider, MD  enoxaparin (LOVENOX) 40 MG/0.4ML injection Inject 40 mg into the skin daily.   Yes Historical Provider, MD  famotidine (  PEPCID) 40 MG tablet Take 40 mg by mouth 2 (two) times daily.   Yes Historical Provider, MD  insulin aspart (NOVOLOG) 100 UNIT/ML FlexPen Inject 3 Units into the skin 3 (three) times daily with meals. 3 units for CBG greater than 150 before meals   Yes Historical Provider, MD  Insulin Glargine (LANTUS) 100 UNIT/ML Solostar Pen Inject 10 Units into the skin every evening.   Yes Historical Provider, MD   ipratropium-albuterol (DUONEB) 0.5-2.5 (3) MG/3ML SOLN Take 3 mLs by nebulization every 6 (six) hours.   Yes Historical Provider, MD  latanoprost (XALATAN) 0.005 % ophthalmic solution Place 1 drop into both eyes at bedtime.   Yes Historical Provider, MD  losartan (COZAAR) 50 MG tablet 50 mg by Gastric Tube route daily.    Yes Historical Provider, MD  Nutritional Supplements (FEEDING SUPPLEMENT, OSMOLITE 1.5 CAL,) LIQD Place 60 mL/hr into feeding tube 3 (three) times daily. Give 77ml/hr Three times a day.   Yes Historical Provider, MD  Omega 3 1000 MG CAPS 2,000 mg by Gastric Tube route daily.   Yes Historical Provider, MD  oxybutynin (DITROPAN) 5 MG tablet 5 mg by Gastric Tube route daily.    Yes Historical Provider, MD  sertraline (ZOLOFT) 100 MG tablet 100 mg by Gastric Tube route daily.   Yes Historical Provider, MD  Water For Irrigation, Sterile (FREE WATER) SOLN Place 100 mLs into feeding tube every 8 (eight) hours. Patient taking differently: Place 5-30 mLs into feeding tube as directed. Give 30 ml free water before and after medication administration and 5 ml free water in between each medications every shift 10/05/14  Yes Lisbeth Renshaw, MD  chlorhexidine (PERIDEX) 0.12 % solution 15 mLs by Mouth Rinse route 2 (two) times daily. Patient not taking: Reported on 11/28/2014 10/05/14   Lisbeth Renshaw, MD  diazepam (VALIUM) 5 MG tablet Take 1 tablet (5 mg total) by mouth every 6 (six) hours as needed for muscle spasms. Patient not taking: Reported on 11/28/2014 10/05/14   Lisbeth Renshaw, MD  docusate sodium (COLACE) 100 MG capsule Take 1 capsule (100 mg total) by mouth daily as needed for mild constipation. Patient not taking: Reported on 11/28/2014 10/05/14   Lisbeth Renshaw, MD  gabapentin (NEURONTIN) 300 MG capsule Take 1 capsule (300 mg total) by mouth at bedtime. Patient not taking: Reported on 09/01/2014 01/01/14   Nelva Nay, MD  heparin 5000 UNIT/ML injection Inject 1 mL (5,000 Units  total) into the skin every 8 (eight) hours. Patient not taking: Reported on 11/28/2014 10/05/14   Lisbeth Renshaw, MD  methylPREDNIsolone (MEDROL DOSPACK) 4 MG tablet follow package directions Patient not taking: Reported on 11/28/2014 10/05/14   Lisbeth Renshaw, MD  metoprolol (LOPRESSOR) 1 MG/ML injection Inject 2.5-5 mLs (2.5-5 mg total) into the vein every 3 (three) hours as needed. Patient not taking: Reported on 11/28/2014 10/05/14   Lisbeth Renshaw, MD  Nutritional Supplements (FEEDING SUPPLEMENT, VITAL AF 1.2 CAL,) LIQD Place 1,000 mLs into feeding tube continuous. Patient not taking: Reported on 11/28/2014 10/05/14   Lisbeth Renshaw, MD  senna (SENOKOT) 8.6 MG TABS tablet Take 1 tablet (8.6 mg total) by mouth daily as needed for mild constipation. Patient not taking: Reported on 11/28/2014 10/05/14   Lisbeth Renshaw, MD   BP 106/61 mmHg  Pulse 95  Temp(Src) 98.1 F (36.7 C) (Oral)  Resp 25  SpO2 97% Physical Exam  Constitutional: He is oriented to person, place, and time. He appears well-developed and well-nourished. He appears distressed (patient is agitated,  upset, but can be calmed with soothing voice and hand holding).  HENT:  Head: Normocephalic and atraumatic.  Nose: Nose normal.  Mouth/Throat: Oropharynx is clear and moist.  Eyes: Conjunctivae and EOM are normal. Pupils are equal, round, and reactive to light.  Neck: Normal range of motion. Neck supple. No JVD present. No tracheal deviation present. No thyromegaly present.  Tracheostomy stoma patent but narrowed.  Cardiovascular: Normal rate, regular rhythm, normal heart sounds and intact distal pulses.  Exam reveals no gallop and no friction rub.   No murmur heard. Pulmonary/Chest: Effort normal and breath sounds normal. No stridor. No respiratory distress. He has no wheezes. He has no rales. He exhibits no tenderness.  Abdominal: Soft. Bowel sounds are normal. He exhibits no distension and no mass. There is no tenderness.  There is no rebound and no guarding.  Musculoskeletal: Normal range of motion. He exhibits no edema or tenderness.  Lymphadenopathy:    He has no cervical adenopathy.  Neurological: He is alert and oriented to person, place, and time. He displays normal reflexes. He exhibits normal muscle tone. Coordination normal.  Skin: Skin is warm and dry. No rash noted. No erythema. No pallor.  Psychiatric: He has a normal mood and affect. His behavior is normal. Judgment and thought content normal.  Nursing note and vitals reviewed.   ED Course  TRACHEOSTOMY REPLACEMENT Date/Time: 12/13/2014 1:00 AM Performed by: Marisa Severin Authorized by: Marisa Severin Consent: The procedure was performed in an emergent situation. Indications: fell out Local anesthesia used: no Patient sedated: no Tube type: single cannula Tube cuff: cuffless Tube size: 6.0 mm Patient tolerance: Patient tolerated the procedure well with no immediate complications Comments: Bougie used 2 help guide tube in place.  RT at bedside.  Unable to get trach in properly, I assisted, and was able to slip it into the trachea.  Patient with good air movement through the trach.  Some bloody secretions through the trach and around the trach.  No active bleeding at this time.  Patient coughing but in no acute distress   (including critical care time) Labs Review Labs Reviewed - No data to display  Imaging Review Dg Chest [facility] 1 View  12/13/2014   CLINICAL DATA:  Tracheostomy tube change.  EXAM: PORTABLE CHEST - 1 VIEW  COMPARISON:  12/02/2014  FINDINGS: Tracheostomy tube tip measures 4.6 cm above the carina. Shallow inspiration with atelectasis in the lung bases. Heart size and pulmonary vascularity are normal. No focal airspace disease in the lungs. No blunting of costophrenic angles. No pneumothorax. Postoperative changes in the cervical spine. Similar appearance to previous study.  IMPRESSION: Shallow inspiration with linear atelectasis in the  lung bases.   Electronically Signed   By: Burman Nieves M.D.   On: 12/13/2014 01:58     EKG Interpretation None      MDM   Final diagnoses:  Tracheostomy, acute management    81 year old male status post tracheostomy dislodgment.  Patient to be given nightly dose of Risperdal to help with agitation.  Marisa Severin, MD 12/13/14 [phone]

## 2014-12-14 ENCOUNTER — Non-Acute Institutional Stay (SKILLED_NURSING_FACILITY): Payer: Medicare Other | Admitting: Adult Health

## 2014-12-14 DIAGNOSIS — L02213 Cutaneous abscess of chest wall: Secondary | ICD-10-CM | POA: Diagnosis not present

## 2014-12-21 ENCOUNTER — Ambulatory Visit (HOSPITAL_COMMUNITY)
Admission: RE | Admit: 2014-12-21 | Discharge: 2014-12-21 | Disposition: A | Discharge status: Home / Self Care | Payer: Medicare Other | Source: Ambulatory Visit | Attending: Internal Medicine | Admitting: Internal Medicine

## 2014-12-21 DIAGNOSIS — R1310 Dysphagia, unspecified: Secondary | ICD-10-CM | POA: Insufficient documentation

## 2014-12-21 DIAGNOSIS — I10 Essential (primary) hypertension: Secondary | ICD-10-CM | POA: Insufficient documentation

## 2014-12-21 DIAGNOSIS — Z931 Gastrostomy status: Secondary | ICD-10-CM | POA: Diagnosis not present

## 2014-12-21 DIAGNOSIS — E785 Hyperlipidemia, unspecified: Secondary | ICD-10-CM | POA: Diagnosis not present

## 2014-12-21 DIAGNOSIS — Z430 Encounter for attention to tracheostomy: Secondary | ICD-10-CM | POA: Diagnosis not present

## 2014-12-21 DIAGNOSIS — F17210 Nicotine dependence, cigarettes, uncomplicated: Secondary | ICD-10-CM | POA: Diagnosis not present

## 2014-12-21 DIAGNOSIS — Z981 Arthrodesis status: Secondary | ICD-10-CM | POA: Insufficient documentation

## 2014-12-21 DIAGNOSIS — E119 Type 2 diabetes mellitus without complications: Secondary | ICD-10-CM | POA: Diagnosis not present

## 2014-12-21 DIAGNOSIS — Z930 Tracheostomy status: Secondary | ICD-10-CM

## 2014-12-21 DIAGNOSIS — F0390 Unspecified dementia without behavioral disturbance: Secondary | ICD-10-CM | POA: Insufficient documentation

## 2014-12-21 NOTE — Consult Note (Signed)
    CC: trach clinic establishment vs decannulation   HPI  81 y/o M with PMH of cervical spondylosis with myelopathy who underwent anterior cervical diskectomy and fusion of C3-C7 on 3/26. Post-operative course complicated by respiratory decompensation due to swelling and required emergent intubation. Self-extubated 3/31 but unable to clear secretions and reintubated. Underwent tracheostomy on 4/2 (ENT). Tx to [facility] 4/6 for further vent weaning / rehab efforts. Had PEG placed by IR while in LTAC. Course complicated by aspiration PNA and prolonged VDRF. Ultimately weaned to trach color but still had C collar in place. At the time we opted to not remove trach until C Spine cleared. He has since been discharged to SNF. Still PEG dependent. Ambulatory but only w/ assist and is high fall risk.  Past Medical History  Diagnosis Date  . Hypertension   . Shortness of breath dyspnea   . Arthritis   . Hyperlipemia   . Frequent urination   . Nocturia   . Diabetes mellitus without complication    No family history on file. Allergies  Allergen Reactions  . Flexeril [Cyclobenzaprine] Other (See Comments)    Hallucinations  . Tramadol Other (See Comments)    Hallunications   History   Social History  . Marital Status: Married    Spouse Name: N/A  . Number of Children: N/A  . Years of Education: N/A   Occupational History  . Not on file.   Social History Main Topics  . Smoking status: Current Some Day Smoker -- 0.25 packs/day for 50 years  . Smokeless tobacco: Never Used  . Alcohol Use: No  . Drug Use: No  . Sexual Activity: Not on file   Other Topics Concern  . Not on file   Social History Narrative    Review of Systems:   Bolds are positive  Unable d/t confusion   Room air 98%, hr 80s, BP 177/88  General appearance:  81 Year old male.  Mouth:  membranes and no mucosal ulcerations; normal hard and soft palate Neck: Trachea midline, # 6 trach, cuffless trach stoma  unremarkable.  Lungs: CTA, with normal respiratory effort and no intercostal retractions. 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   Passed trach occlusion trial. Tolerated well. Decannulated here in clinic  Plan D/c back to SNF Keep NPO w/ nutrition via PEG tube Cont rehab efforts Occlusive dressing Call PRN   Simonne Martinet ACNP-BC [facility] Pulmonary/Critical Care Pager # [phone] OR # [phone] if no answer  Patient well known to [place], seen in the hospital.  First time in the trach clinic.  Phonating well when trach is plugged.  He is NPO eating via PEG.  No SOB and no desaturation.  Will decannulate and f/u if deteriorates.  Patient seen and examined, agree with above note.  I dictated the care and orders written for this patient under my direction.  Alyson Reedy, MD [phone]

## 2014-12-21 NOTE — Progress Notes (Signed)
Tracheostomy Procedure Note  ANH DIERKER [id number] 05-09-1933  Pre Procedure Tracheostomy Information  Trach Brand: Shiley Size: 6 Style: Uncuffed Secured by: Velcro  VS- BP-177/98, HR-98, RR-18, SpO2-99% on RA   Procedure: Decannulation     Post Procedure Evaluation:  Vital signs:blood pressure 162/98, pulse 96, respirations 20 and pulse oximetry 96% on RA Patients current condition: stable Complications: No apparent complications Trach site exam: clean, dry Wound care done: Vaseline gauze Patient did tolerate procedure well.   Education: Vaseline gauze to stay on the next 24 hours then change QD until stoma completely closes. Do not submerge stoma under water.  Prescription needs: None

## 2015-01-06 ENCOUNTER — Encounter (HOSPITAL_COMMUNITY): Payer: Self-pay | Admitting: Emergency Medicine

## 2015-01-06 ENCOUNTER — Emergency Department (HOSPITAL_COMMUNITY)
Admission: EM | Admit: 2015-01-06 | Discharge: 2015-01-06 | Disposition: A | Discharge status: Home / Self Care | Payer: Medicare Other | Attending: Emergency Medicine | Admitting: Emergency Medicine

## 2015-01-06 ENCOUNTER — Emergency Department (HOSPITAL_COMMUNITY): Payer: Medicare Other

## 2015-01-06 DIAGNOSIS — Z794 Long term (current) use of insulin: Secondary | ICD-10-CM | POA: Diagnosis not present

## 2015-01-06 DIAGNOSIS — M1990 Unspecified osteoarthritis, unspecified site: Secondary | ICD-10-CM | POA: Diagnosis not present

## 2015-01-06 DIAGNOSIS — F0390 Unspecified dementia without behavioral disturbance: Secondary | ICD-10-CM | POA: Insufficient documentation

## 2015-01-06 DIAGNOSIS — W1839XA Other fall on same level, initial encounter: Secondary | ICD-10-CM | POA: Insufficient documentation

## 2015-01-06 DIAGNOSIS — Y92129 Unspecified place in nursing home as the place of occurrence of the external cause: Secondary | ICD-10-CM | POA: Insufficient documentation

## 2015-01-06 DIAGNOSIS — E785 Hyperlipidemia, unspecified: Secondary | ICD-10-CM | POA: Diagnosis not present

## 2015-01-06 DIAGNOSIS — E119 Type 2 diabetes mellitus without complications: Secondary | ICD-10-CM | POA: Diagnosis not present

## 2015-01-06 DIAGNOSIS — Y999 Unspecified external cause status: Secondary | ICD-10-CM | POA: Insufficient documentation

## 2015-01-06 DIAGNOSIS — W19XXXA Unspecified fall, initial encounter: Secondary | ICD-10-CM

## 2015-01-06 DIAGNOSIS — I10 Essential (primary) hypertension: Secondary | ICD-10-CM | POA: Diagnosis not present

## 2015-01-06 DIAGNOSIS — Y939 Activity, unspecified: Secondary | ICD-10-CM | POA: Diagnosis not present

## 2015-01-06 DIAGNOSIS — Z720 Tobacco use: Secondary | ICD-10-CM | POA: Insufficient documentation

## 2015-01-06 DIAGNOSIS — Z7982 Long term (current) use of aspirin: Secondary | ICD-10-CM | POA: Insufficient documentation

## 2015-01-06 DIAGNOSIS — Z79899 Other long term (current) drug therapy: Secondary | ICD-10-CM | POA: Diagnosis not present

## 2015-01-06 DIAGNOSIS — Z043 Encounter for examination and observation following other accident: Secondary | ICD-10-CM | POA: Diagnosis present

## 2015-01-06 NOTE — ED Notes (Signed)
Per EMS-unwitnessed fall, facility states he hit head-sent here for eval

## 2015-01-06 NOTE — ED Notes (Signed)
Attempt to call report x 2. [facility]

## 2015-01-06 NOTE — Discharge Instructions (Signed)
Return here as needed.  Follow-up with his primary care doctor

## 2015-01-06 NOTE — ED Provider Notes (Signed)
CSN: [id number]     Arrival date & time 01/06/15  1831 History   First MD Initiated Contact with Patient 01/06/15 1857     Chief Complaint  Patient presents with  . Fall     (Consider location/radiation/quality/duration/timing/severity/associated sxs/prior Treatment) HPI Patient presents to the emergency department following a fall that occurred just prior to arrival is an unwitnessed fall and the staff reported that they are unsure what happened.  The patient has dementia and is unable to give me any history Past Medical History  Diagnosis Date  . Hypertension   . Shortness of breath dyspnea   . Arthritis   . Hyperlipemia   . Frequent urination   . Nocturia   . Diabetes mellitus without complication    Past Surgical History  Procedure Laterality Date  . Spinal surgery      fusion  . Spinal fusion  1986  . Shoulder surgies Bilateral   . Lower back  1970  . Cholecystectomy  1998  . Tracheostomy tube placement N/A 10/01/2014    Procedure: TRACHEOSTOMY;  Surgeon: Flo Shanks, MD;  Location: [facility] OR;  Service: ENT;  Laterality: N/A;  . Direct laryngoscopy N/A 10/01/2014    Procedure: DIRECT LARYNGOSCOPY;  Surgeon: Flo Shanks, MD;  Location: [facility] OR;  Service: ENT;  Laterality: N/A;  . Anterior cervical decompression/discectomy fusion 4 levels N/A 09/23/2014    Procedure: CERVICAL THREE-FOUR,CERVICAL FOUR-FIVE,CERVICAL FIVE-SIX,CERVICAL SIX-SEVEN,ANTERIOR CERVICAL DECOMPRESSION WITH FUSION INTERBODY PROSTHESIS PLATING AND BONEGRAFT.;  Surgeon: Lisbeth Renshaw, MD;  Location: [facility] NEURO ORS;  Service: Neurosurgery;  Laterality: N/A;   History reviewed. No pertinent family history. History  Substance Use Topics  . Smoking status: Current Some Day Smoker -- 0.25 packs/day for 50 years  . Smokeless tobacco: Never Used  . Alcohol Use: No    Review of Systems  Level V caveat due to Dementia Allergies  Flexeril and Tramadol  Home Medications   Prior to Admission medications    Medication Sig Start Date End Date Taking? Authorizing Provider  antiseptic oral rinse (BIOTENE) LIQD 15 mLs by Mouth Rinse route 3 (three) times daily.   Yes Historical Provider, MD  aspirin 81 MG tablet 81 mg by Gastric Tube route daily.    Yes Historical Provider, MD  brimonidine (ALPHAGAN) 0.2 % ophthalmic solution Place 1 drop into both eyes 3 (three) times daily.   Yes Historical Provider, MD  Cholecalciferol (VITAMIN D) 2000 UNITS CAPS 2,000 Units by Gastric Tube route daily.    Yes Historical Provider, MD  clonazePAM (KLONOPIN) 0.5 MG tablet Take 0.25 mg by mouth 2 (two) times daily as needed for anxiety (Give half a tablet by mouth once daily for anxiety/agigtation).   Yes Historical Provider, MD  diclofenac sodium (VOLTAREN) 1 % GEL Apply 2 g topically 2 (two) times daily. Apply to hands, arms and shoulders.   Yes Historical Provider, MD  divalproex (DEPAKOTE SPRINKLE) 125 MG capsule Take 125 mg by mouth 2 (two) times daily.   Yes Historical Provider, MD  enoxaparin (LOVENOX) 40 MG/0.4ML injection Inject 40 mg into the skin daily.   Yes Historical Provider, MD  famotidine (PEPCID) 40 MG tablet Take 40 mg by mouth 2 (two) times daily.   Yes Historical Provider, MD  insulin aspart (NOVOLOG) 100 UNIT/ML FlexPen Inject 3 Units into the skin 4 (four) times daily. 3 units for CBG greater than 150 before meals   Yes Historical Provider, MD  Insulin Glargine (LANTUS) 100 UNIT/ML Solostar Pen Inject 10 Units into the  skin daily at 10 pm. Give at bedtime   Yes Historical Provider, MD  latanoprost (XALATAN) 0.005 % ophthalmic solution Place 1 drop into both eyes at bedtime.   Yes Historical Provider, MD  losartan (COZAAR) 50 MG tablet 50 mg by Gastric Tube route daily.    Yes Historical Provider, MD  memantine (NAMENDA XR) 7 MG CP24 24 hr capsule Take 7 mg by mouth daily. Give 7mg  via G tube once daily for 7 days. (01/05/15 start date) for dementia   Yes Historical Provider, MD  Multiple  Vitamins-Minerals (DECUBI-VITE) CAPS Take 2 capsules by mouth. Via G tube once daily as supplement   Yes Historical Provider, MD  Nutritional Supplements (FEEDING SUPPLEMENT, OSMOLITE 1.5 CAL,) LIQD Place 60 mL/hr into feeding tube 3 (three) times daily. Give 75ml/hr Three times a day.   Yes Historical Provider, MD  oxybutynin (DITROPAN) 5 MG tablet 5 mg by Gastric Tube route daily.    Yes Historical Provider, MD  PRESCRIPTION MEDICATION 200 mLs. Give 200 ml flush every 4 hours via tube every 4 hours   Yes Historical Provider, MD  risperiDONE (RISPERDAL) 0.25 MG tablet Take 0.25 mg by mouth daily. Give 0.25mg  via G tube in the morning for anxiety   Yes Historical Provider, MD  risperiDONE (RISPERDAL) 0.5 MG tablet Take 0.5 mg by mouth at bedtime. Give 0.5mg  via G tube at bedtime for Anxiety   Yes Historical Provider, MD  sertraline (ZOLOFT) 100 MG tablet 100 mg by Gastric Tube route daily.   Yes Historical Provider, MD  valproic acid (DEPAKENE) 250 MG capsule Take 250 mg by mouth 2 (two) times daily. Give via G tube twice daily for anxiety   Yes Historical Provider, MD  chlorhexidine (PERIDEX) 0.12 % solution 15 mLs by Mouth Rinse route 2 (two) times daily. Patient not taking: Reported on 11/28/2014 10/05/14   Lisbeth Renshaw, MD  diazepam (VALIUM) 5 MG tablet Take 1 tablet (5 mg total) by mouth every 6 (six) hours as needed for muscle spasms. Patient not taking: Reported on 11/28/2014 10/05/14   Lisbeth Renshaw, MD  docusate sodium (COLACE) 100 MG capsule Take 1 capsule (100 mg total) by mouth daily as needed for mild constipation. Patient not taking: Reported on 11/28/2014 10/05/14   Lisbeth Renshaw, MD  gabapentin (NEURONTIN) 300 MG capsule Take 1 capsule (300 mg total) by mouth at bedtime. Patient not taking: Reported on 09/01/2014 01/01/14   Nelva Nay, MD  heparin 5000 UNIT/ML injection Inject 1 mL (5,000 Units total) into the skin every 8 (eight) hours. Patient not taking: Reported on 11/28/2014  10/05/14   Lisbeth Renshaw, MD  memantine (NAMENDA XR) 7 MG CP24 24 hr capsule Take 7 mg by mouth daily. Give 2 capsules (14mg )  via G tube daily for 7 days (starting on 01/12/15) for dementia    Historical Provider, MD  memantine (NAMENDA XR) 7 MG CP24 24 hr capsule Take 7 mg by mouth daily. Give 3 capsules (21mg ) via G tube once daily for 7 days  Starting on 01/19/15 for dementia    Historical Provider, MD  memantine (NAMENDA XR) 7 MG CP24 24 hr capsule Take 7 mg by mouth daily. Give 4 capsules (28mg ) via G tube once daily for dementia    Historical Provider, MD  methylPREDNIsolone (MEDROL DOSPACK) 4 MG tablet follow package directions Patient not taking: Reported on 11/28/2014 10/05/14   Lisbeth Renshaw, MD  metoprolol (LOPRESSOR) 1 MG/ML injection Inject 2.5-5 mLs (2.5-5 mg total) into the vein every  3 (three) hours as needed. Patient not taking: Reported on 11/28/2014 10/05/14   Lisbeth RenshawNeelesh Nundkumar, MD  Nutritional Supplements (FEEDING SUPPLEMENT, VITAL AF 1.2 CAL,) LIQD Place 1,000 mLs into feeding tube continuous. Patient not taking: Reported on 11/28/2014 10/05/14   Lisbeth RenshawNeelesh Nundkumar, MD  senna (SENOKOT) 8.6 MG TABS tablet Take 1 tablet (8.6 mg total) by mouth daily as needed for mild constipation. Patient not taking: Reported on 11/28/2014 10/05/14   Lisbeth RenshawNeelesh Nundkumar, MD  Water For Irrigation, Sterile (FREE WATER) SOLN Place 100 mLs into feeding tube every 8 (eight) hours. Patient taking differently: Place 5-30 mLs into feeding tube as directed. Give 30 ml free water before and after medication administration and 5 ml free water in between each medications every shift 10/05/14   Lisbeth RenshawNeelesh Nundkumar, MD   BP 105/56 mmHg  Pulse 100  Temp(Src) 98.5 F (36.9 C) (Oral)  Resp 18  SpO2 94% Physical Exam  Constitutional: He appears well-developed and well-nourished.  HENT:  Head: Normocephalic and atraumatic.  Mouth/Throat: Oropharynx is clear and moist.  Eyes: Pupils are equal, round, and reactive to  light.  Cardiovascular: Normal rate, regular rhythm and normal heart sounds.  Exam reveals no gallop and no friction rub.   No murmur heard. Pulmonary/Chest: Effort normal and breath sounds normal.  Musculoskeletal:  I did head to toe palpation in the patient does not react in pain  Neurological:  Unable to do any neurological testing as the patient does not follow my commands  Skin: Skin is warm and dry.  Nursing note and vitals reviewed.   ED Course  Procedures (including critical care time) Labs Review Labs Reviewed - No data to display  Imaging Review Ct Head Wo Contrast  01/06/2015   CLINICAL DATA:  Fall. Head and neck injury and pain. Previous spinal fusion.  EXAM: CT HEAD WITHOUT CONTRAST  CT CERVICAL SPINE WITHOUT CONTRAST  TECHNIQUE: Multidetector CT imaging of the head and cervical spine was performed following the standard protocol without intravenous contrast. Multiplanar CT image reconstructions of the cervical spine were also generated.  COMPARISON:  12/02/2014  FINDINGS: CT HEAD FINDINGS  There is no evidence of intracranial hemorrhage, brain edema, or other signs of acute infarction. There is no evidence of intracranial mass lesion or mass effect. No abnormal extraaxial fluid collections are identified.  Moderate cerebral atrophy and chronic small vessel disease are unchanged in appearance. Ventricles stable in size. No evidence of skull fracture.  CT CERVICAL SPINE FINDINGS  Hardware from anterior cervical disc fusion again seen from levels of C3-C7. Cervical spine alignment remains normal. No evidence of acute cervical spine fracture. Mild to moderate bilateral facet DJD again demonstrated, left side greater than right. Atlantoaxial degenerative changes also appears stable. No other significant bone abnormality identified.  IMPRESSION: No acute intracranial abnormality. Stable cerebral atrophy and chronic small vessel disease.  No evidence of acute cervical spine fracture or  subluxation. Degenerative spondylosis and prior anterior cervical disc fusion from C3-C7 again seen.   Electronically Signed   By: Myles RosenthalJohn  Stahl M.D.   On: 01/06/2015 19:55   Ct Cervical Spine Wo Contrast  01/06/2015   CLINICAL DATA:  Fall. Head and neck injury and pain. Previous spinal fusion.  EXAM: CT HEAD WITHOUT CONTRAST  CT CERVICAL SPINE WITHOUT CONTRAST  TECHNIQUE: Multidetector CT imaging of the head and cervical spine was performed following the standard protocol without intravenous contrast. Multiplanar CT image reconstructions of the cervical spine were also generated.  COMPARISON:  12/02/2014  FINDINGS:  CT HEAD FINDINGS  There is no evidence of intracranial hemorrhage, brain edema, or other signs of acute infarction. There is no evidence of intracranial mass lesion or mass effect. No abnormal extraaxial fluid collections are identified.  Moderate cerebral atrophy and chronic small vessel disease are unchanged in appearance. Ventricles stable in size. No evidence of skull fracture.  CT CERVICAL SPINE FINDINGS  Hardware from anterior cervical disc fusion again seen from levels of C3-C7. Cervical spine alignment remains normal. No evidence of acute cervical spine fracture. Mild to moderate bilateral facet DJD again demonstrated, left side greater than right. Atlantoaxial degenerative changes also appears stable. No other significant bone abnormality identified.  IMPRESSION: No acute intracranial abnormality. Stable cerebral atrophy and chronic small vessel disease.  No evidence of acute cervical spine fracture or subluxation. Degenerative spondylosis and prior anterior cervical disc fusion from C3-C7 again seen.   Electronically Signed   By: Myles Rosenthal M.D.   On: 01/06/2015 19:55    Patient's CT scans did not show any abnormality.  Patient be sent back to the nursing home.     Charlestine Night, PA-C 01/06/15 2007  Lorre Nick, MD 01/06/15 2128

## 2015-01-06 NOTE — ED Notes (Signed)
[facility]

## 2015-01-06 NOTE — ED Notes (Signed)
Pt taken to CT.

## 2015-01-10 ENCOUNTER — Encounter: Payer: Self-pay | Admitting: Adult Health

## 2015-01-10 DIAGNOSIS — R451 Restlessness and agitation: Secondary | ICD-10-CM | POA: Insufficient documentation

## 2015-01-10 DIAGNOSIS — R443 Hallucinations, unspecified: Secondary | ICD-10-CM | POA: Insufficient documentation

## 2015-01-10 DIAGNOSIS — F01518 Vascular dementia, unspecified severity, with other behavioral disturbance: Secondary | ICD-10-CM | POA: Insufficient documentation

## 2015-01-10 DIAGNOSIS — F0151 Vascular dementia with behavioral disturbance: Secondary | ICD-10-CM | POA: Insufficient documentation

## 2015-01-10 NOTE — Progress Notes (Signed)
Patient ID: Ricardo Rivera, male   DOB: 11/03/1932, 81 y.o.   MRN: [id number]  [facility]     Allergies  Allergen Reactions  . Flexeril [Cyclobenzaprine] Other (See Comments)    Hallucinations  . Tramadol Other (See Comments)    Hallunications       Chief Complaint  Patient presents with  . Acute Visit    patient status     HPI:  Last pm the nursing staff was concern that he was developing pneumonia. He had a chest x-ray done and was started on abt. His x-ray was negative. He is actively hallucinating and is easily agitated.  He is unable to participate with the hpi or ros.   Past Medical History  Diagnosis Date  . Hypertension   . Shortness of breath dyspnea   . Arthritis   . Hyperlipemia   . Frequent urination   . Nocturia   . Diabetes mellitus without complication     Past Surgical History  Procedure Laterality Date  . Spinal surgery      fusion  . Spinal fusion  1986  . Shoulder surgies Bilateral   . Lower back  1970  . Cholecystectomy  1998  . Tracheostomy tube placement N/A 10/01/2014    Procedure: TRACHEOSTOMY;  Surgeon: Flo ShanksKarol Wolicki, MD;  [facility];  Service: ENT;  Laterality: N/A;  . Direct laryngoscopy N/A 10/01/2014    Procedure: DIRECT LARYNGOSCOPY;  Surgeon: Flo ShanksKarol Wolicki, MD;  [facility];  Service: ENT;  Laterality: N/A;  . Anterior cervical decompression/discectomy fusion 4 levels N/A 09/23/2014    Procedure: CERVICAL THREE-FOUR,CERVICAL FOUR-FIVE,CERVICAL FIVE-SIX,CERVICAL SIX-SEVEN,ANTERIOR CERVICAL DECOMPRESSION WITH FUSION INTERBODY PROSTHESIS PLATING AND BONEGRAFT.;  Surgeon: Lisbeth RenshawNeelesh Nundkumar, MD;  Location: [facility] NEURO ORS;  Service: Neurosurgery;  Laterality: N/A;    VITAL SIGNS BP 129/80 mmHg  Pulse 68  Ht 6' (1.829 m)  Wt 180 lb (81.647 kg)  BMI 24.41 kg/m2   Outpatient Encounter Prescriptions as of 12/08/2014  Medication Sig  . antiseptic oral rinse (BIOTENE) LIQD 15 mLs by Mouth Rinse route 3 (three) times daily.  Marland Kitchen. aspirin  81 MG tablet 81 mg by Gastric Tube route daily.   . brimonidine (ALPHAGAN) 0.2 % ophthalmic solution Place 1 drop into both eyes 3 (three) times daily.  . Insulin Glargine (LANTUS) 100 UNIT/ML Solostar Pen Inject 10 Units into the skin daily at 10 pm. Give at bedtime  . ipratropium-albuterol (DUONEB) 0.5-2.5 (3) MG/3ML SOLN Take 3 mLs by nebulization every 6 (six) hours.  . Omega-3 Fatty Acids (FISH OIL) 1000 MG CPDR Take 2,000 mg by mouth daily.  . Cholecalciferol (VITAMIN D) 2000 UNITS CAPS 2,000 Units by Gastric Tube route daily.   . diclofenac sodium (VOLTAREN) 1 % GEL Apply 2 g topically 2 (two) times daily. Apply to hands, arms and shoulders.  . enoxaparin (LOVENOX) 40 MG/0.4ML injection Inject 40 mg into the skin daily.  . insulin aspart (NOVOLOG) 100 UNIT/ML FlexPen Inject 3 Units into the skin 4 (four) times daily. 3 units for CBG greater than 150 before meals  . latanoprost (XALATAN) 0.005 % ophthalmic solution Place 1 drop into both eyes at bedtime.  Marland Kitchen. losartan (COZAAR) 50 MG tablet 50 mg by Gastric Tube route daily.   . Nutritional Supplements (FEEDING SUPPLEMENT, OSMOLITE 1.5 CAL,) LIQD Place 60 mL/hr into feeding tube 3 (three) times daily. Give 7460ml/hr Three times a day.  . oxybutynin (DITROPAN) 5 MG tablet 5 mg by Gastric Tube route daily.   . sertraline (  ZOLOFT) 100 MG tablet 100 mg by Gastric Tube route daily.   pepcid 40 mg  40 mg twice daily    risperdal 0.5 mg  0.5 mg nightly   prostat  30 cc daily   . Water For Irrigation, Sterile (FREE WATER) SOLN  Give 30 ml free water before and after medication administration and 5 ml free water in between each medications every shift)      SIGNIFICANT DIAGNOSTIC EXAMS   11-28-14: chest x-ray: 1. Satisfactory position of tracheostomy tube with tip above carina. 2. Right base atelectasis.  12-07-14: chest x-ray: slight bilateral atelectasis no infiltrate or congestion.    LABS REVIEWED:   4-8-116: hgb a1c 6.3 11-12-14: wbc  7.6;hgb 10.4; hct 31.6; mcv 87.1; plt 329 11-20-14: glucose 116; bun 16;creat 0.52; k+3.7; na++127      ROS Unable to perform ROS: dementia   Physical Exam Constitutional: No distress.  Thin   Neck: Neck supple. No JVD present. No thyromegaly present.  Cardiovascular: Normal rate, regular rhythm and intact distal pulses.   Respiratory: Effort normal and breath sounds normal. No respiratory distress.  GI: Soft. Bowel sounds are normal. He exhibits no distension. There is no tenderness.  Musculoskeletal: He exhibits no edema.  Is able to move all extremities   Neurological: He is alert.  Skin: Skin is warm and dry. He is not diaphoretic.     ASSESSMENT/ PLAN:  Respiratory failure Hallucination and agitation   He is not congested today; his x-ray is negative for pneumonia; he is unable to swallow his fish oil and will stop this. Due to his agitation will begin depakote sprinkles 125 mg twice daily; will change his novolog to every 6 hours while he is on continuous tube feeding. Will stop the levaquin and florastor and will monitor his status.   Synthia Innocent NP [facility]  Contact [phone] Monday through Friday 8am- 5pm  After hours call [phone]

## 2015-01-10 NOTE — Progress Notes (Signed)
Patient ID: Ricardo Rivera, male   DOB: 09/12/1932, 81 y.o.   MRN: [id number]  [facility]     Allergies  Allergen Reactions  . Flexeril [Cyclobenzaprine] Other (See Comments)    Hallucinations  . Tramadol Other (See Comments)    Hallunications       Chief Complaint  Patient presents with  . Acute Visit    patient status     HPI:  He has recently pulled out his trach requiring him to go to the ED to have his trach replaced. He is confused is actively hallucinating; and is restless. I have spoken with his daughter at great length regarding his status. She states he was showing signs of dementia prior to his hospitalization.    Past Medical History  Diagnosis Date  . Hypertension   . Shortness of breath dyspnea   . Arthritis   . Hyperlipemia   . Frequent urination   . Nocturia   . Diabetes mellitus without complication     Past Surgical History  Procedure Laterality Date  . Spinal surgery      fusion  . Spinal fusion  1986  . Shoulder surgies Bilateral   . Lower back  1970  . Cholecystectomy  1998  . Tracheostomy tube placement N/A 10/01/2014    Procedure: TRACHEOSTOMY;  Surgeon: Flo ShanksKarol Wolicki, MD;  [facility];  Service: ENT;  Laterality: N/A;  . Direct laryngoscopy N/A 10/01/2014    Procedure: DIRECT LARYNGOSCOPY;  Surgeon: Flo ShanksKarol Wolicki, MD;  [facility];  Service: ENT;  Laterality: N/A;  . Anterior cervical decompression/discectomy fusion 4 levels N/A 09/23/2014    Procedure: CERVICAL THREE-FOUR,CERVICAL FOUR-FIVE,CERVICAL FIVE-SIX,CERVICAL SIX-SEVEN,ANTERIOR CERVICAL DECOMPRESSION WITH FUSION INTERBODY PROSTHESIS PLATING AND BONEGRAFT.;  Surgeon: Lisbeth RenshawNeelesh Nundkumar, MD;  Location: [facility] OR[facility];  Service: Neurosurgery;  Laterality: N/A;    VITAL SIGNS BP 132/79 mmHg  Pulse 80  Ht 6' (1.829 m)  Wt 180 lb (81.647 kg)  BMI 24.41 kg/m2   Outpatient Encounter Prescriptions as of 12/01/2014  Medication Sig  Asa 81 mg daily Brimonidine 0.2% 1 drop both eyes  twice daily duoneb every 6 hours Fish oil 1 gm daily  florastor twice daily lantus 10 units daily  lovenox 40 mg daily  novolog 3 units for cbg >=150 three times daily  oxybutrin 5 mg twice daily  pepcid 40 mg twice daily  risperdal 0.5 mg nightly  Vit d 2000 units daily  voltaren gel 1% to hand arms shoulders twice daily  xalatan 1 drop both eyes nightly zoloft 100 mg daily     SIGNIFICANT DIAGNOSTIC EXAMS  11-28-14: chest x-ray: 1. Satisfactory position of tracheostomy tube with tip above carina. 2. Right base atelectasis.    LABS REVIEWED:   4-8-116: hgb a1c 6.3 11-12-14: wbc 7.6;hgb 10.4; hct 31.6; mcv 87.1; plt 329 11-20-14: glucose 116; bun 16;creat 0.52; k+3.7; na++127      Review of Systems  Unable to perform ROS: dementia     Physical Exam  Constitutional: No distress.  Thin   Neck: Neck supple. No JVD present. No thyromegaly present.  Cardiovascular: Normal rate, regular rhythm and intact distal pulses.   Respiratory: Effort normal and breath sounds normal. No respiratory distress.  GI: Soft. Bowel sounds are normal. He exhibits no distension. There is no tenderness.  Musculoskeletal: He exhibits no edema.  Is able to move all extremities   Neurological: He is alert.  Skin: Skin is warm and dry. He is not diaphoretic.  ASSESSMENT/ PLAN:  Respiratory failure Hallucinations Vascular dementia:   Will setup an outpatient ct of head without contrast Will check tsh vit b12/folate cbc and cmp Will setup pulmonology consult to remove trach   Time spent with patient 45 minutes >50% spent with counseling; reviewing chart; tests; labs; developing plan of care for his his future care.      Synthia Innocent NP [facility] Adult Medicine  Contact [phone] Monday through Friday 8am- 5pm  After hours call [phone]

## 2015-01-11 ENCOUNTER — Emergency Department (HOSPITAL_COMMUNITY)
Admission: EM | Admit: 2015-01-11 | Discharge: 2015-01-11 | Disposition: A | Discharge status: Home / Self Care | Payer: Medicare Other | Attending: Emergency Medicine | Admitting: Emergency Medicine

## 2015-01-11 ENCOUNTER — Encounter (HOSPITAL_COMMUNITY): Payer: Self-pay | Admitting: Emergency Medicine

## 2015-01-11 ENCOUNTER — Emergency Department (HOSPITAL_COMMUNITY): Payer: Medicare Other

## 2015-01-11 DIAGNOSIS — S01112A Laceration without foreign body of left eyelid and periocular area, initial encounter: Secondary | ICD-10-CM | POA: Insufficient documentation

## 2015-01-11 DIAGNOSIS — Z794 Long term (current) use of insulin: Secondary | ICD-10-CM | POA: Diagnosis not present

## 2015-01-11 DIAGNOSIS — W050XXA Fall from non-moving wheelchair, initial encounter: Secondary | ICD-10-CM | POA: Diagnosis not present

## 2015-01-11 DIAGNOSIS — E119 Type 2 diabetes mellitus without complications: Secondary | ICD-10-CM | POA: Insufficient documentation

## 2015-01-11 DIAGNOSIS — IMO0002 Reserved for concepts with insufficient information to code with codable children: Secondary | ICD-10-CM

## 2015-01-11 DIAGNOSIS — Z7982 Long term (current) use of aspirin: Secondary | ICD-10-CM | POA: Insufficient documentation

## 2015-01-11 DIAGNOSIS — Y9389 Activity, other specified: Secondary | ICD-10-CM | POA: Insufficient documentation

## 2015-01-11 DIAGNOSIS — Z7901 Long term (current) use of anticoagulants: Secondary | ICD-10-CM | POA: Insufficient documentation

## 2015-01-11 DIAGNOSIS — E785 Hyperlipidemia, unspecified: Secondary | ICD-10-CM | POA: Insufficient documentation

## 2015-01-11 DIAGNOSIS — M1990 Unspecified osteoarthritis, unspecified site: Secondary | ICD-10-CM | POA: Insufficient documentation

## 2015-01-11 DIAGNOSIS — Z79899 Other long term (current) drug therapy: Secondary | ICD-10-CM | POA: Insufficient documentation

## 2015-01-11 DIAGNOSIS — Y92128 Other place in nursing home as the place of occurrence of the external cause: Secondary | ICD-10-CM | POA: Diagnosis not present

## 2015-01-11 DIAGNOSIS — Y998 Other external cause status: Secondary | ICD-10-CM | POA: Diagnosis not present

## 2015-01-11 DIAGNOSIS — I10 Essential (primary) hypertension: Secondary | ICD-10-CM | POA: Diagnosis not present

## 2015-01-11 DIAGNOSIS — Z720 Tobacco use: Secondary | ICD-10-CM | POA: Insufficient documentation

## 2015-01-11 DIAGNOSIS — W19XXXA Unspecified fall, initial encounter: Secondary | ICD-10-CM

## 2015-01-11 DIAGNOSIS — Z043 Encounter for examination and observation following other accident: Secondary | ICD-10-CM | POA: Diagnosis present

## 2015-01-11 DIAGNOSIS — F0390 Unspecified dementia without behavioral disturbance: Secondary | ICD-10-CM | POA: Diagnosis not present

## 2015-01-11 LAB — CBC WITH DIFFERENTIAL/PLATELET
Basophils Absolute: 0 10*3/uL (ref 0.0–0.1)
Basophils Relative: 1 % (ref 0–1)
EOS ABS: 0.3 10*3/uL (ref 0.0–0.7)
Eosinophils Relative: 4 % (ref 0–5)
HCT: 33.6 % — ABNORMAL LOW (ref 39.0–52.0)
Hemoglobin: 11.2 g/dL — ABNORMAL LOW (ref 13.0–17.0)
LYMPHS PCT: 30 % (ref 12–46)
Lymphs Abs: 2.3 10*3/uL (ref 0.7–4.0)
MCH: 28.4 pg (ref 26.0–34.0)
MCHC: 33.3 g/dL (ref 30.0–36.0)
MCV: 85.1 fL (ref 78.0–100.0)
MONO ABS: 0.6 10*3/uL (ref 0.1–1.0)
Monocytes Relative: 7 % (ref 3–12)
NEUTROS ABS: 4.7 10*3/uL (ref 1.7–7.7)
Neutrophils Relative %: 58 % (ref 43–77)
PLATELETS: 296 10*3/uL (ref 150–400)
RBC: 3.95 MIL/uL — ABNORMAL LOW (ref 4.22–5.81)
RDW: 14.5 % (ref 11.5–15.5)
WBC: 7.9 10*3/uL (ref 4.0–10.5)

## 2015-01-11 LAB — BASIC METABOLIC PANEL
Anion gap: 11 (ref 5–15)
BUN: 19 mg/dL (ref 6–20)
CO2: 27 mmol/L (ref 22–32)
Calcium: 9.5 mg/dL (ref 8.9–10.3)
Chloride: 101 mmol/L (ref 101–111)
Creatinine, Ser: 0.62 mg/dL (ref 0.61–1.24)
GFR calc Af Amer: >60 mL/min (ref >60)
GFR calc non Af Amer: >60 mL/min (ref >60)
GLUCOSE: 85 mg/dL (ref 65–99)
Potassium: 4.3 mmol/L (ref 3.5–5.1)
Sodium: 139 mmol/L (ref 135–145)

## 2015-01-11 MED ORDER — LIDOCAINE HCL (PF) 1 % IJ SOLN
5.0000 mL | Freq: Once | INTRAMUSCULAR | Status: DC
  Filled 2015-01-11: qty 5

## 2015-01-11 NOTE — Discharge Instructions (Signed)
Please follow up in 7 days to have his sutures removed.   Laceration Care, Adult A laceration is a cut or lesion that goes through all layers of the skin and into the tissue just beneath the skin. TREATMENT  Some lacerations may not require closure. Some lacerations may not be able to be closed due to an increased risk of infection. It is important to see your caregiver as soon as possible after an injury to minimize the risk of infection and maximize the opportunity for successful closure. If closure is appropriate, pain medicines may be given, if needed. The wound will be cleaned to help prevent infection. Your caregiver will use stitches (sutures), staples, wound glue (adhesive), or skin adhesive strips to repair the laceration. These tools bring the skin edges together to allow for faster healing and a better cosmetic outcome. However, all wounds will heal with a scar. Once the wound has healed, scarring can be minimized by covering the wound with sunscreen during the day for 1 full year. HOME CARE INSTRUCTIONS  For sutures or staples:  Keep the wound clean and dry.  If you were given a bandage (dressing), you should change it at least once a day. Also, change the dressing if it becomes wet or dirty, or as directed by your caregiver.  Wash the wound with soap and water 2 times a day. Rinse the wound off with water to remove all soap. Pat the wound dry with a clean towel.  After cleaning, apply a thin layer of the antibiotic ointment as recommended by your caregiver. This will help prevent infection and keep the dressing from sticking.  You may shower as usual after the first 24 hours. Do not soak the wound in water until the sutures are removed.  Only take over-the-counter or prescription medicines for pain, discomfort, or fever as directed by your caregiver.  Get your sutures or staples removed as directed by your caregiver. For skin adhesive strips:  Keep the wound clean and dry.  Do  not get the skin adhesive strips wet. You may bathe carefully, using caution to keep the wound dry.  If the wound gets wet, pat it dry with a clean towel.  Skin adhesive strips will fall off on their own. You may trim the strips as the wound heals. Do not remove skin adhesive strips that are still stuck to the wound. They will fall off in time. For wound adhesive:  You may briefly wet your wound in the shower or bath. Do not soak or scrub the wound. Do not swim. Avoid periods of heavy perspiration until the skin adhesive has fallen off on its own. After showering or bathing, gently pat the wound dry with a clean towel.  Do not apply liquid medicine, cream medicine, or ointment medicine to your wound while the skin adhesive is in place. This may loosen the film before your wound is healed.  If a dressing is placed over the wound, be careful not to apply tape directly over the skin adhesive. This may cause the adhesive to be pulled off before the wound is healed.  Avoid prolonged exposure to sunlight or tanning lamps while the skin adhesive is in place. Exposure to ultraviolet light in the first year will darken the scar.  The skin adhesive will usually remain in place for 5 to 10 days, then naturally fall off the skin. Do not pick at the adhesive film. You may need a tetanus shot if:  You cannot remember when  you had your last tetanus shot.  You have never had a tetanus shot. If you get a tetanus shot, your arm may swell, get red, and feel warm to the touch. This is common and not a problem. If you need a tetanus shot and you choose not to have one, there is a rare chance of getting tetanus. Sickness from tetanus can be serious. SEEK MEDICAL CARE IF:   You have redness, swelling, or increasing pain in the wound.  You see a red line that goes away from the wound.  You have yellowish-white fluid (pus) coming from the wound.  You have a fever.  You notice a bad smell coming from the wound  or dressing.  Your wound breaks open before or after sutures have been removed.  You notice something coming out of the wound such as wood or glass.  Your wound is on your hand or foot and you cannot move a finger or toe. SEEK IMMEDIATE MEDICAL CARE IF:   Your pain is not controlled with prescribed medicine.  You have severe swelling around the wound causing pain and numbness or a change in color in your arm, hand, leg, or foot.  Your wound splits open and starts bleeding.  You have worsening numbness, weakness, or loss of function of any joint around or beyond the wound.  You develop painful lumps near the wound or on the skin anywhere on your body. MAKE SURE YOU:   Understand these instructions.  Will watch your condition.  Will get help right away if you are not doing well or get worse. Document Released: 06/17/2005 Document Revised: 09/09/2011 Document Reviewed: 12/11/2010 [facility] Patient Information 2015 [place], [place]. This information is not intended to replace advice given to you by your health care provider. Make sure you discuss any questions you have with your health care provider.  Fall Prevention and Home Safety Falls cause injuries and can affect all age groups. It is possible to use preventive measures to significantly decrease the likelihood of falls. There are many simple measures which can make your home safer and prevent falls. OUTDOORS  Repair cracks and edges of walkways and driveways.  Remove high doorway thresholds.  Trim shrubbery on the main path into your home.  Have good outside lighting.  Clear walkways of tools, rocks, debris, and clutter.  Check that handrails are not broken and are securely fastened. Both sides of steps should have handrails.  Have leaves, snow, and ice cleared regularly.  Use sand or salt on walkways during winter months.  In the garage, clean up grease or oil spills. BATHROOM  Install night lights.  Install grab  bars by the toilet and in the tub and shower.  Use non-skid mats or decals in the tub or shower.  Place a plastic non-slip stool in the shower to sit on, if needed.  Keep floors dry and clean up all water on the floor immediately.  Remove soap buildup in the tub or shower on a regular basis.  Secure bath mats with non-slip, double-sided rug tape.  Remove throw rugs and tripping hazards from the floors. BEDROOMS  Install night lights.  Make sure a bedside light is easy to reach.  Do not use oversized bedding.  Keep a telephone by your bedside.  Have a firm chair with side arms to use for getting dressed.  Remove throw rugs and tripping hazards from the floor. KITCHEN  Keep handles on pots and pans turned toward the center of the  stove. Use back burners when possible.  Clean up spills quickly and allow time for drying.  Avoid walking on wet floors.  Avoid hot utensils and knives.  Position shelves so they are not too high or low.  Place commonly used objects within easy reach.  If necessary, use a sturdy step stool with a grab bar when reaching.  Keep electrical cables out of the way.  Do not use floor polish or wax that makes floors slippery. If you must use wax, use non-skid floor wax.  Remove throw rugs and tripping hazards from the floor. STAIRWAYS  Never leave objects on stairs.  Place handrails on both sides of stairways and use them. Fix any loose handrails. Make sure handrails on both sides of the stairways are as long as the stairs.  Check carpeting to make sure it is firmly attached along stairs. Make repairs to worn or loose carpet promptly.  Avoid placing throw rugs at the top or bottom of stairways, or properly secure the rug with carpet tape to prevent slippage. Get rid of throw rugs, if possible.  Have an electrician put in a light switch at the top and bottom of the stairs. OTHER FALL PREVENTION TIPS  Wear low-heel or rubber-soled shoes that  are supportive and fit well. Wear closed toe shoes.  When using a stepladder, make sure it is fully opened and both spreaders are firmly locked. Do not climb a closed stepladder.  Add color or contrast paint or tape to grab bars and handrails in your home. Place contrasting color strips on first and last steps.  Learn and use mobility aids as needed. Install an electrical emergency response system.  Turn on lights to avoid dark areas. Replace light bulbs that burn out immediately. Get light switches that glow.  Arrange furniture to create clear pathways. Keep furniture in the same place.  Firmly attach carpet with non-skid or double-sided tape.  Eliminate uneven floor surfaces.  Select a carpet pattern that does not visually hide the edge of steps.  Be aware of all pets. OTHER HOME SAFETY TIPS  Set the water temperature for 120 F (48.8 C).  Keep emergency numbers on or near the telephone.  Keep smoke detectors on every level of the home and near sleeping areas. Document Released: 06/07/2002 Document Revised: 12/17/2011 Document Reviewed: 09/06/2011 [facility] Patient Information 2015 [place]. This information is not intended to replace advice given to you by your health care provider. Make sure you discuss any questions you have with your health care provider.

## 2015-01-11 NOTE — ED Provider Notes (Signed)
CSN: [id number]     Arrival date & time 01/11/15  1745 History   First MD Initiated Contact with Patient 01/11/15 1840     Chief Complaint  Patient presents with  . Fall   Level V caveat: Dementia.  Ricardo Rivera is a 81 y.o. male coming from [facility] with a history of dementia, hypertension, and diabetes who presents to the emergency department after falling out of his wheelchair at his nursing facility. According to EMS the patient had a witnessed fall while leaning forward in his wheelchair landing on the left side of his head. They denied any loss of consciousness. They believe the patient was trying to get up to stand. When asked patient denies any pain or any complaints. I'm unable to get [employer] of nursing staff at [facility]. The patient denies any headache, weakness, chest pain, or cough.  (Consider location/radiation/quality/duration/timing/severity/associated sxs/prior Treatment) HPI  Past Medical History  Diagnosis Date  . Hypertension   . Shortness of breath dyspnea   . Arthritis   . Hyperlipemia   . Frequent urination   . Nocturia   . Diabetes mellitus without complication    Past Surgical History  Procedure Laterality Date  . Spinal surgery      fusion  . Spinal fusion  1986  . Shoulder surgies Bilateral   . Lower back  1970  . Cholecystectomy  1998  . Tracheostomy tube placement N/A 10/01/2014    Procedure: TRACHEOSTOMY;  Surgeon: Flo Shanks, MD;  Location: [facility] OR;  Service: ENT;  Laterality: N/A;  . Direct laryngoscopy N/A 10/01/2014    Procedure: DIRECT LARYNGOSCOPY;  Surgeon: Flo Shanks, MD;  Location: [facility] OR;  Service: ENT;  Laterality: N/A;  . Anterior cervical decompression/discectomy fusion 4 levels N/A 09/23/2014    Procedure: CERVICAL THREE-FOUR,CERVICAL FOUR-FIVE,CERVICAL FIVE-SIX,CERVICAL SIX-SEVEN,ANTERIOR CERVICAL DECOMPRESSION WITH FUSION INTERBODY PROSTHESIS PLATING AND BONEGRAFT.;  Surgeon: Lisbeth Renshaw, MD;  Location:  [facility] ORS;  Service: Neurosurgery;  Laterality: N/A;   History reviewed. No pertinent family history. History  Substance Use Topics  . Smoking status: Current Some Day Smoker -- 0.25 packs/day for 50 years  . Smokeless tobacco: Never Used  . Alcohol Use: No    Review of Systems  Unable to perform ROS: Dementia      Allergies  Flexeril and Tramadol  Home Medications   Prior to Admission medications   Medication Sig Start Date End Date Taking? Authorizing Provider  Amino Acids-Protein Hydrolys (FEEDING SUPPLEMENT, PRO-STAT SUGAR FREE 64,) LIQD 30 mLs by Gastric Tube route daily.   Yes Historical Provider, MD  antiseptic oral rinse (BIOTENE) LIQD 15 mLs by Mouth Rinse route 3 (three) times daily.   Yes Historical Provider, MD  aspirin 81 MG tablet 81 mg by Gastric Tube route daily.    Yes Historical Provider, MD  brimonidine (ALPHAGAN) 0.2 % ophthalmic solution Place 1 drop into both eyes 3 (three) times daily.   Yes Historical Provider, MD  Cholecalciferol (VITAMIN D) 2000 UNITS CAPS 2,000 Units by Gastric Tube route daily.    Yes Historical Provider, MD  clonazePAM (KLONOPIN) 0.5 MG tablet Take 0.25 mg by mouth daily as needed for anxiety.    Yes Historical Provider, MD  diclofenac sodium (VOLTAREN) 1 % GEL Apply 2 g topically 2 (two) times daily. Apply to hands, arms and shoulders.   Yes Historical Provider, MD  enoxaparin (LOVENOX) 40 MG/0.4ML injection Inject 40 mg into the skin daily.   Yes Historical Provider, MD  famotidine (PEPCID) 40 MG tablet 40 mg by Gastric Tube route 2 (two) times daily.    Yes Historical Provider, MD  insulin aspart (NOVOLOG) 100 UNIT/ML FlexPen Inject 3 Units into the skin 4 (four) times daily. 3 units for CBG greater than 150 before meals   Yes Historical Provider, MD  Insulin Glargine (LANTUS) 100 UNIT/ML Solostar Pen Inject 10 Units into the skin daily at 10 pm. Give at bedtime   Yes Historical Provider, MD  latanoprost (XALATAN) 0.005 %  ophthalmic solution Place 1 drop into both eyes at bedtime.   Yes Historical Provider, MD  losartan (COZAAR) 50 MG tablet 50 mg by Gastric Tube route daily.    Yes Historical Provider, MD  memantine (NAMENDA XR) 7 MG CP24 24 hr capsule Take 7 mg by mouth daily. Give 2 capsules (14mg )  via G tube daily for 7 days (starting on 01/12/15) for dementia   Yes Historical Provider, MD  memantine (NAMENDA XR) 7 MG CP24 24 hr capsule Take 7 mg by mouth daily. Give 3 capsules (21mg ) via G tube once daily for 7 days  Starting on 01/19/15 for dementia   Yes Historical Provider, MD  memantine (NAMENDA XR) 7 MG CP24 24 hr capsule Take 7 mg by mouth daily. Give 4 capsules (28mg ) via G tube once daily for dementia   Yes Historical Provider, MD  Multiple Vitamins-Minerals (DECUBI-VITE) CAPS Take 2 capsules by mouth daily. Via G tube once daily as supplement   Yes Historical Provider, MD  Nutritional Supplements (FEEDING SUPPLEMENT, OSMOLITE 1.5 CAL,) LIQD Place 60 mL/hr into feeding tube 3 (three) times daily. Give 8160ml/hr Three times a day.   Yes Historical Provider, MD  Omega-3 Fatty Acids (FISH OIL) 1000 MG CPDR Take 2,000 mg by mouth daily.   Yes Historical Provider, MD  oxybutynin (DITROPAN) 5 MG tablet 5 mg by Gastric Tube route daily.    Yes Historical Provider, MD  PRESCRIPTION MEDICATION 200 mLs. Give 200 ml flush every 4 hours via tube every 4 hours   Yes Historical Provider, MD  risperiDONE (RISPERDAL) 0.25 MG tablet 0.25 mg by Gastric Tube route daily.    Yes Historical Provider, MD  risperiDONE (RISPERDAL) 0.5 MG tablet 0.5 mg by Gastric Tube route at bedtime.    Yes Historical Provider, MD  sertraline (ZOLOFT) 100 MG tablet 100 mg by Gastric Tube route daily.   Yes Historical Provider, MD  valproic acid (DEPAKENE) 250 MG capsule Take 250 mg by mouth 2 (two) times daily. Give via G tube twice daily for anxiety   Yes Historical Provider, MD  chlorhexidine (PERIDEX) 0.12 % solution 15 mLs by Mouth Rinse route  2 (two) times daily. Patient not taking: Reported on 01/11/2015 10/05/14   Lisbeth RenshawNeelesh Nundkumar, MD  diazepam (VALIUM) 5 MG tablet Take 1 tablet (5 mg total) by mouth every 6 (six) hours as needed for muscle spasms. Patient not taking: Reported on 11/28/2014 10/05/14   Lisbeth RenshawNeelesh Nundkumar, MD  docusate sodium (COLACE) 100 MG capsule Take 1 capsule (100 mg total) by mouth daily as needed for mild constipation. Patient not taking: Reported on 11/28/2014 10/05/14   Lisbeth RenshawNeelesh Nundkumar, MD  gabapentin (NEURONTIN) 300 MG capsule Take 1 capsule (300 mg total) by mouth at bedtime. Patient not taking: Reported on 09/01/2014 01/01/14   Nelva Nayobert Beaton, MD  heparin 5000 UNIT/ML injection Inject 1 mL (5,000 Units total) into the skin every 8 (eight) hours. Patient not taking: Reported on 11/28/2014 10/05/14   Lisbeth RenshawNeelesh Nundkumar, MD  methylPREDNIsolone (MEDROL DOSPACK) 4 MG tablet follow package directions Patient not taking: Reported on 11/28/2014 10/05/14   Lisbeth Renshaw, MD  metoprolol (LOPRESSOR) 1 MG/ML injection Inject 2.5-5 mLs (2.5-5 mg total) into the vein every 3 (three) hours as needed. Patient not taking: Reported on 11/28/2014 10/05/14   Lisbeth Renshaw, MD  Nutritional Supplements (FEEDING SUPPLEMENT, VITAL AF 1.2 CAL,) LIQD Place 1,000 mLs into feeding tube continuous. Patient not taking: Reported on 11/28/2014 10/05/14   Lisbeth Renshaw, MD  senna (SENOKOT) 8.6 MG TABS tablet Take 1 tablet (8.6 mg total) by mouth daily as needed for mild constipation. Patient not taking: Reported on 11/28/2014 10/05/14   Lisbeth Renshaw, MD  Water For Irrigation, Sterile (FREE WATER) SOLN Place 100 mLs into feeding tube every 8 (eight) hours. Patient taking differently: Place 5-30 mLs into feeding tube as directed. Give 30 ml free water before and after medication administration and 5 ml free water in between each medications every shift 10/05/14   Lisbeth Renshaw, MD   BP 133/79 mmHg  Pulse 101  Temp(Src) 98 F (36.7 C) (Oral)   Resp 22  SpO2 96% Physical Exam  Constitutional: He appears well-developed and well-nourished. No distress.  Nontoxic appearing.  HENT:  Head: Normocephalic.  Right Ear: External ear normal.  Left Ear: External ear normal.  Mouth/Throat: Oropharynx is clear and moist. No oropharyngeal exudate.  The patient has a small 3 cm laceration above his left eyebrow. Bleeding is controlled.   Eyes: Conjunctivae are normal. Pupils are equal, round, and reactive to light. Right eye exhibits no discharge. Left eye exhibits no discharge.  Neck: Normal range of motion. Neck supple. No JVD present. No tracheal deviation present.  Cardiovascular: Normal rate, regular rhythm, normal heart sounds and intact distal pulses.  Exam reveals no gallop and no friction rub.   No murmur heard. Bilateral radial pulses are intact.  Pulmonary/Chest: Effort normal and breath sounds normal. No respiratory distress. He has no wheezes. He has no rales.  Lungs are clear to auscultation bilaterally.  Abdominal: Soft. There is no tenderness.  Abdomen is soft. Bowel sounds are present. G-tube is in place in his left abdomen. No signs of infection. No discharge.  Musculoskeletal: He exhibits no edema.  No midline neck or back tenderness. I palpated the patient and was unable to elicit any pain. Patient is spontaneously moving all extremities in a coordinated fashion exhibiting good strength.   Lymphadenopathy:    He has no cervical adenopathy.  Neurological: He is alert. Coordination normal.  Patient is oriented to person only. He does not follow most commands and will not participate for a neurological examination.   Skin: Skin is warm and dry. No rash noted. He is not diaphoretic. No erythema. No pallor.  Psychiatric: He has a normal mood and affect. His behavior is normal.  Pleasantly demented.   Nursing note and vitals reviewed.   ED Course  LACERATION REPAIR Date/Time: 01/11/2015 9:15 PM Performed by: Everlene Farrier Authorized by: Everlene Farrier Consent: Verbal consent obtained. Risks and benefits: risks, benefits and alternatives were discussed Consent given by: patient Site marked: the operative site was marked Imaging studies: imaging studies available Required items: required blood products, implants, devices, and special equipment available Patient identity confirmed: arm band Time out: Immediately prior to procedure a "time out" was called to verify the correct patient, procedure, equipment, support staff and site/side marked as required. Body area: head/neck Location details: left eyebrow Laceration length: 3 cm Foreign bodies: no foreign  bodies Tendon involvement: none Nerve involvement: none Vascular damage: no Anesthesia: local infiltration Local anesthetic: lidocaine 1% without epinephrine Anesthetic total: 2 ml Patient sedated: no Preparation: Patient was prepped and draped in the usual sterile fashion. Irrigation solution: saline Irrigation method: jet lavage Amount of cleaning: extensive Debridement: none Degree of undermining: none Skin closure: 5-0 Prolene Number of sutures: 5 Technique: simple Approximation: close Approximation difficulty: simple Dressing: non-adhesive packing strip Patient tolerance: Patient tolerated the procedure well with no immediate complications Comments: Sutures placed by PA student Misty Stanley under my supervision.    (including critical care time) Labs Review Labs Reviewed  CBC WITH DIFFERENTIAL/PLATELET - Abnormal; Notable for the following:    RBC 3.95 (*)    Hemoglobin 11.2 (*)    HCT 33.6 (*)    All other components within normal limits  BASIC METABOLIC PANEL    Imaging Review Ct Head Wo Contrast  01/11/2015   CLINICAL DATA:  Status post fall.  Laceration.  EXAM: CT HEAD WITHOUT CONTRAST  TECHNIQUE: Contiguous axial images were obtained from the base of the skull through the vertex without intravenous contrast.  COMPARISON:   01/06/2015  FINDINGS: There is no evidence of mass effect, midline shift, or extra-axial fluid collections. There is no evidence of a space-occupying lesion or intracranial hemorrhage. There is no evidence of a cortical-based area of acute infarction. There is generalized cerebral atrophy. There is periventricular white matter low attenuation likely secondary to microangiopathy.  The ventricles and sulci are appropriate for the patient's age. The basal cisterns are patent.  Visualized portions of the orbits are unremarkable. The visualized portions of the paranasal sinuses and mastoid air cells are unremarkable. Cerebrovascular atherosclerotic calcifications are noted.  The osseous structures are unremarkable. There is a left periorbital soft tissue hematoma.  IMPRESSION: 1. No acute intracranial pathology. 2. Chronic microvascular disease and cerebral atrophy.   Electronically Signed   By: Elige Ko   On: 01/11/2015 20:07     EKG Interpretation None      Filed Vitals:   01/11/15 1806 01/11/15 1937 01/11/15 2133  BP: 127/76 150/80 133/79  Pulse: 93 97 101  Temp: 98 F (36.7 C)    TempSrc: Oral    Resp: 20 25 22   SpO2: 96% 95% 96%     MDM   Final diagnoses:  Fall, initial encounter  Laceration    This is a 81 y.o. male coming from [facility] with a history of dementia, hypertension, and diabetes who presents to the emergency department after falling out of his wheelchair at his nursing facility. According to EMS the patient had a witnessed fall while leaning forward in his wheelchair landing on the left side of his head. They denied any loss of consciousness. They believe the patient was trying to get up to stand. When asked patient denies any pain or any complaints.  On exam patient is afebrile and nontoxic appearing. The patient has a 3 cm well approximated laceration to his left eyebrow. Patient is pleasantly demented. He is unable to follow commands enough to participate  for neurological exam. CT head was obtained which is unremarkable. CBC shows a hemoglobin of 11.2 which is an improvement from his previous. BMP is within normal limits. Laceration was repaired by PA student Misty Stanley under my supervision and tolerated well by the patient. Five 5-0 proline sutures were placed. Will have them removed in 7 days. Will discharge back to SNF.   This patient was discussed with Dr. Patria Mane  who agrees with assessment and plan.   Everlene Farrier, PA-C 01/11/15 2141  Azalia Bilis, MD 01/11/15 [phone]

## 2015-01-11 NOTE — ED Notes (Signed)
[facility] 81 [facility].  Pt was witnessed leaning forward in recliner attempting to get up and fell forward.  No LOC.  Alert and oriented to self and situation.  Pt is having physical therapy at this time to help him walk again; pt may have been confused thinking he could walk at this time per nursing home staff.  Pt was seen last week for same.  Small laceration above left eye, approximately 4 cm in length, bleeding controlled at this time.

## 2015-01-11 NOTE — ED Notes (Signed)
Pt gone to CT 

## 2015-01-11 NOTE — Progress Notes (Signed)
CSW attempted to meet with pt at bedside. However, he is not effectively communicative. There was no family present.  Per note, the pt has a hx of dementia. CSW consulted with nurse who confirms that pt is [facility]. Nurse also confirms that [facility].  Trish MageBrittney Lakoda Raske, LCSWA [phone] ED CSW 01/11/2015 9:39 PM

## 2015-01-24 NOTE — Progress Notes (Signed)
Patient ID: Ricardo Rivera, male   DOB: Aug 20, 1932, 81 y.o.   MRN: [id number]   Facility: [facility]      Allergies  Allergen Reactions  . Flexeril [Cyclobenzaprine] Other (See Comments)    Hallucinations  . Tramadol Other (See Comments)    Hallunications    Chief Complaint  Patient presents with  . Acute Visit    chest wall cyst     HPI:  Staff reports that he has a chest wall inflamed area present. They are concerned that the area is infected. The area is red and inflamed and is tender to touch. There are no reports of fever present.    Past Medical History  Diagnosis Date  . Hypertension   . Shortness of breath dyspnea   . Arthritis   . Hyperlipemia   . Frequent urination   . Nocturia   . Diabetes mellitus without complication     Past Surgical History  Procedure Laterality Date  . Spinal surgery      fusion  . Spinal fusion  1986  . Shoulder surgies Bilateral   . Lower back  1970  . Cholecystectomy  1998  . Tracheostomy tube placement N/A 10/01/2014    Procedure: TRACHEOSTOMY;  Surgeon: Flo Shanks, MD;  Location: [facility] OR;  Service: ENT;  Laterality: N/A;  . Direct laryngoscopy N/A 10/01/2014    Procedure: DIRECT LARYNGOSCOPY;  Surgeon: Flo Shanks, MD;  Location: [facility] OR;  Service: ENT;  Laterality: N/A;  . Anterior cervical decompression/discectomy fusion 4 levels N/A 09/23/2014    Procedure: CERVICAL THREE-FOUR,CERVICAL FOUR-FIVE,CERVICAL FIVE-SIX,CERVICAL SIX-SEVEN,ANTERIOR CERVICAL DECOMPRESSION WITH FUSION INTERBODY PROSTHESIS PLATING AND BONEGRAFT.;  Surgeon: Lisbeth Renshaw, MD;  Location: [facility] OR[facility];  Service: Neurosurgery;  Laterality: N/A;    VITAL SIGNS BP 122/87 mmHg  Pulse 80  Ht  (1.803 m)  Wt 167 lb (75.751 kg)  BMI 23.30 kg/m2  SpO2 94%  Patient's Medications  New Prescriptions   No medications on file  Previous Medications  . antiseptic oral rinse (BIOTENE) LIQD 15 mLs by Mouth Rinse route 3 (three) times daily.    Marland Kitchen aspirin 81 MG tablet 81 mg by Gastric Tube route daily.   . brimonidine (ALPHAGAN) 0.2 % ophthalmic solution Place 1 drop into both eyes 3 (three) times daily.  . Insulin Glargine (LANTUS) 100 UNIT/ML Solostar Pen Inject 10 Units into the skin daily at 10 pm. Give at bedtime  . ipratropium-albuterol (DUONEB) 0.5-2.5 (3) MG/3ML SOLN Take 3 mLs by nebulization every 6 (six) hours.  . Omega-3 Fatty Acids (FISH OIL) 1000 MG CPDR Take 2,000 mg by mouth daily.  . Cholecalciferol (VITAMIN D) 2000 UNITS CAPS 2,000 Units by Gastric Tube route daily.   . diclofenac sodium (VOLTAREN) 1 % GEL Apply 2 g topically 2 (two) times daily. Apply to hands, arms and shoulders.  . enoxaparin (LOVENOX) 40 MG/0.4ML injection Inject 40 mg into the skin daily.  . insulin aspart (NOVOLOG) 100 UNIT/ML FlexPen Inject 3 Units into the skin 4 (four) times daily. 3 units for CBG greater than 150 before meals  . latanoprost (XALATAN) 0.005 % ophthalmic solution Place 1 drop into both eyes at bedtime.  Marland Kitchen losartan (COZAAR) 50 MG tablet 50 mg by Gastric Tube route daily.   . Nutritional Supplements (FEEDING SUPPLEMENT, OSMOLITE 1.5 CAL,) LIQD Place 60 mL/hr into feeding tube 3 (three) times daily. Give 29ml/hr Three times a day.  . oxybutynin (DITROPAN) 5 MG tablet 5 mg by Gastric Tube route daily.   Marland Kitchen  sertraline (ZOLOFT) 100 MG tablet 100 mg by Gastric Tube route daily.   pepcid 40 mg  40 mg twice daily    risperdal 0.5 mg  0.5 mg nightly   prostat  30 cc daily   . Water For Irrigation, Sterile (FREE WATER) SOLN  Give 30 ml free water before and after medication administration and 5 ml free water in between each medications every shift)     Modified Medications   No medications on file  Discontinued Medications   No medications on file     SIGNIFICANT DIAGNOSTIC EXAMS   11-28-14: chest x-ray: 1. Satisfactory position of tracheostomy tube with tip above carina. 2. Right base atelectasis.  12-07-14: chest x-ray: slight  bilateral atelectasis no infiltrate or congestion.    LABS REVIEWED:   4-8-116: hgb a1c 6.3 11-12-14: wbc 7.6;hgb 10.4; hct 31.6; mcv 87.1; plt 329 11-20-14: glucose 116; bun 16;creat 0.52; k+3.7; na++127 12-02-14: wbc 7.5; hgb 10.9; hct 36.0; mcv 89.6; plt 322; glucose 101; bun 23.5; creat 0.43; k+4.0; na++141; liver normal albumin 3.2; folic >20; vit b12: 670      Review of Systems Unable to perform ROS: dementia    Physical Exam Constitutional: No distress.  Thin   Neck: Neck supple. No JVD present. No thyromegaly present.  Cardiovascular: Normal rate, regular rhythm and intact distal pulses.   Respiratory: Effort normal and breath sounds normal. No respiratory distress.  GI: Soft. Bowel sounds are normal. He exhibits no distension. There is no tenderness.  Musculoskeletal: He exhibits no edema.  Is able to move all extremities   Neurological: He is alert.  Skin: Skin is warm and dry. He is not diaphoretic.  Has chest wall abscess is red hot inflamed with scabbed area presen   ASSESSMENT/ PLAN:  Chest wall abscess: will begin keflex 500 mg three times daily for 10 days with florastor twice daily for 3 weeks.     Synthia Innocent NP [facility]  Contact [phone] Monday through Friday 8am- 5pm  After hours call [phone]

## 2015-02-04 ENCOUNTER — Inpatient Hospital Stay (HOSPITAL_COMMUNITY)
Admission: EM | Admit: 2015-02-04 | Discharge: 2015-03-02 | DRG: 871 | Disposition: E | Payer: Medicare Other | Attending: Internal Medicine | Admitting: Internal Medicine

## 2015-02-04 ENCOUNTER — Encounter (HOSPITAL_COMMUNITY): Payer: Self-pay | Admitting: Emergency Medicine

## 2015-02-04 ENCOUNTER — Emergency Department (HOSPITAL_COMMUNITY): Payer: Medicare Other

## 2015-02-04 DIAGNOSIS — Z885 Allergy status to narcotic agent status: Secondary | ICD-10-CM | POA: Diagnosis not present

## 2015-02-04 DIAGNOSIS — Z66 Do not resuscitate: Secondary | ICD-10-CM | POA: Diagnosis present

## 2015-02-04 DIAGNOSIS — G9340 Encephalopathy, unspecified: Secondary | ICD-10-CM | POA: Diagnosis present

## 2015-02-04 DIAGNOSIS — F419 Anxiety disorder, unspecified: Secondary | ICD-10-CM | POA: Diagnosis present

## 2015-02-04 DIAGNOSIS — E43 Unspecified severe protein-calorie malnutrition: Secondary | ICD-10-CM | POA: Diagnosis present

## 2015-02-04 DIAGNOSIS — Z931 Gastrostomy status: Secondary | ICD-10-CM

## 2015-02-04 DIAGNOSIS — F17210 Nicotine dependence, cigarettes, uncomplicated: Secondary | ICD-10-CM | POA: Diagnosis present

## 2015-02-04 DIAGNOSIS — E119 Type 2 diabetes mellitus without complications: Secondary | ICD-10-CM | POA: Diagnosis present

## 2015-02-04 DIAGNOSIS — R350 Frequency of micturition: Secondary | ICD-10-CM | POA: Diagnosis present

## 2015-02-04 DIAGNOSIS — R221 Localized swelling, mass and lump, neck: Secondary | ICD-10-CM | POA: Diagnosis present

## 2015-02-04 DIAGNOSIS — I10 Essential (primary) hypertension: Secondary | ICD-10-CM | POA: Diagnosis present

## 2015-02-04 DIAGNOSIS — K219 Gastro-esophageal reflux disease without esophagitis: Secondary | ICD-10-CM | POA: Diagnosis present

## 2015-02-04 DIAGNOSIS — R4182 Altered mental status, unspecified: Secondary | ICD-10-CM | POA: Diagnosis not present

## 2015-02-04 DIAGNOSIS — Z888 Allergy status to other drugs, medicaments and biological substances status: Secondary | ICD-10-CM | POA: Diagnosis not present

## 2015-02-04 DIAGNOSIS — E860 Dehydration: Secondary | ICD-10-CM | POA: Diagnosis present

## 2015-02-04 DIAGNOSIS — Z981 Arthrodesis status: Secondary | ICD-10-CM | POA: Diagnosis not present

## 2015-02-04 DIAGNOSIS — Z7982 Long term (current) use of aspirin: Secondary | ICD-10-CM | POA: Diagnosis not present

## 2015-02-04 DIAGNOSIS — R05 Cough: Secondary | ICD-10-CM

## 2015-02-04 DIAGNOSIS — F0391 Unspecified dementia with behavioral disturbance: Secondary | ICD-10-CM | POA: Diagnosis present

## 2015-02-04 DIAGNOSIS — N390 Urinary tract infection, site not specified: Secondary | ICD-10-CM | POA: Diagnosis present

## 2015-02-04 DIAGNOSIS — E785 Hyperlipidemia, unspecified: Secondary | ICD-10-CM | POA: Diagnosis present

## 2015-02-04 DIAGNOSIS — I959 Hypotension, unspecified: Secondary | ICD-10-CM | POA: Diagnosis present

## 2015-02-04 DIAGNOSIS — A4102 Sepsis due to Methicillin resistant Staphylococcus aureus: Secondary | ICD-10-CM | POA: Diagnosis not present

## 2015-02-04 DIAGNOSIS — E876 Hypokalemia: Secondary | ICD-10-CM | POA: Diagnosis present

## 2015-02-04 DIAGNOSIS — L89151 Pressure ulcer of sacral region, stage 1: Secondary | ICD-10-CM | POA: Diagnosis present

## 2015-02-04 DIAGNOSIS — Z794 Long term (current) use of insulin: Secondary | ICD-10-CM

## 2015-02-04 DIAGNOSIS — Z930 Tracheostomy status: Secondary | ICD-10-CM | POA: Diagnosis not present

## 2015-02-04 DIAGNOSIS — M1990 Unspecified osteoarthritis, unspecified site: Secondary | ICD-10-CM | POA: Diagnosis present

## 2015-02-04 DIAGNOSIS — R351 Nocturia: Secondary | ICD-10-CM | POA: Diagnosis present

## 2015-02-04 DIAGNOSIS — Z6820 Body mass index (BMI) 20.0-20.9, adult: Secondary | ICD-10-CM

## 2015-02-04 DIAGNOSIS — R6510 Systemic inflammatory response syndrome (SIRS) of non-infectious origin without acute organ dysfunction: Secondary | ICD-10-CM

## 2015-02-04 DIAGNOSIS — R627 Adult failure to thrive: Secondary | ICD-10-CM

## 2015-02-04 DIAGNOSIS — R509 Fever, unspecified: Secondary | ICD-10-CM

## 2015-02-04 DIAGNOSIS — F03918 Unspecified dementia, unspecified severity, with other behavioral disturbance: Secondary | ICD-10-CM

## 2015-02-04 DIAGNOSIS — R059 Cough, unspecified: Secondary | ICD-10-CM

## 2015-02-04 DIAGNOSIS — Z79899 Other long term (current) drug therapy: Secondary | ICD-10-CM | POA: Diagnosis not present

## 2015-02-04 DIAGNOSIS — J189 Pneumonia, unspecified organism: Secondary | ICD-10-CM

## 2015-02-04 DIAGNOSIS — L8990 Pressure ulcer of unspecified site, unspecified stage: Secondary | ICD-10-CM | POA: Insufficient documentation

## 2015-02-04 HISTORY — DX: Gastro-esophageal reflux disease without esophagitis: K21.9

## 2015-02-04 HISTORY — DX: Anxiety disorder, unspecified: F41.9

## 2015-02-04 LAB — CBC WITH DIFFERENTIAL/PLATELET
BASOS ABS: 0 10*3/uL (ref 0.0–0.1)
BASOS PCT: 0 % (ref 0–1)
EOS PCT: 0 % (ref 0–5)
Eosinophils Absolute: 0 10*3/uL (ref 0.0–0.7)
HEMATOCRIT: 29.3 % — AB (ref 39.0–52.0)
HEMOGLOBIN: 9.9 g/dL — AB (ref 13.0–17.0)
LYMPHS ABS: 1.6 10*3/uL (ref 0.7–4.0)
Lymphocytes Relative: 11 % — ABNORMAL LOW (ref 12–46)
MCH: 28.9 pg (ref 26.0–34.0)
MCHC: 33.8 g/dL (ref 30.0–36.0)
MCV: 85.7 fL (ref 78.0–100.0)
MONO ABS: 1.3 10*3/uL — AB (ref 0.1–1.0)
MONOS PCT: 9 % (ref 3–12)
NEUTROS ABS: 11.1 10*3/uL — AB (ref 1.7–7.7)
Neutrophils Relative %: 80 % — ABNORMAL HIGH (ref 43–77)
Platelets: 210 10*3/uL (ref 150–400)
RBC: 3.42 MIL/uL — AB (ref 4.22–5.81)
RDW: 15.8 % — ABNORMAL HIGH (ref 11.5–15.5)
WBC: 13.9 10*3/uL — AB (ref 4.0–10.5)

## 2015-02-04 LAB — GLUCOSE, CAPILLARY
GLUCOSE-CAPILLARY: 90 mg/dL (ref 65–99)
Glucose-Capillary: 79 mg/dL (ref 65–99)

## 2015-02-04 LAB — COMPREHENSIVE METABOLIC PANEL
ALK PHOS: 66 U/L (ref 38–126)
ALT: 21 U/L (ref 17–63)
ANION GAP: 6 (ref 5–15)
AST: 31 U/L (ref 15–41)
Albumin: 2.3 g/dL — ABNORMAL LOW (ref 3.5–5.0)
BUN: 30 mg/dL — AB (ref 6–20)
CALCIUM: 8.1 mg/dL — AB (ref 8.9–10.3)
CHLORIDE: 103 mmol/L (ref 101–111)
CO2: 26 mmol/L (ref 22–32)
Creatinine, Ser: 0.72 mg/dL (ref 0.61–1.24)
GFR calc non Af Amer: >60 mL/min (ref >60)
Glucose, Bld: 137 mg/dL — ABNORMAL HIGH (ref 65–99)
Potassium: 3.7 mmol/L (ref 3.5–5.1)
SODIUM: 135 mmol/L (ref 135–145)
TOTAL PROTEIN: 7.4 g/dL (ref 6.5–8.1)
Total Bilirubin: 0.6 mg/dL (ref 0.3–1.2)

## 2015-02-04 LAB — URINALYSIS, ROUTINE W REFLEX MICROSCOPIC
Bilirubin Urine: NEGATIVE
GLUCOSE, UA: NEGATIVE mg/dL
Hgb urine dipstick: NEGATIVE
Ketones, ur: NEGATIVE mg/dL
Nitrite: NEGATIVE
PH: 7.5 (ref 5.0–8.0)
Protein, ur: 30 mg/dL — AB
SPECIFIC GRAVITY, URINE: 1.023 (ref 1.005–1.030)
UROBILINOGEN UA: 1 mg/dL (ref 0.0–1.0)

## 2015-02-04 LAB — URINE MICROSCOPIC-ADD ON

## 2015-02-04 LAB — I-STAT CG4 LACTIC ACID, ED
Lactic Acid, Venous: 1.1 mmol/L (ref 0.5–2.0)
Lactic Acid, Venous: 1.7 mmol/L (ref 0.5–2.0)

## 2015-02-04 LAB — MRSA PCR SCREENING: MRSA by PCR: POSITIVE — AB

## 2015-02-04 MED ORDER — FREE WATER
200.0000 mL | Freq: Three times a day (TID) | Status: DC
  Administered 2015-02-04 – 2015-02-06 (×5): 200 mL

## 2015-02-04 MED ORDER — SODIUM CHLORIDE 0.9 % IV BOLUS (SEPSIS)
2000.0000 mL | Freq: Once | INTRAVENOUS | Status: AC
  Administered 2015-02-04: 2000 mL via INTRAVENOUS

## 2015-02-04 MED ORDER — INSULIN GLARGINE 100 UNIT/ML ~~LOC~~ SOLN
10.0000 [IU] | Freq: Every day | SUBCUTANEOUS | Status: DC
  Filled 2015-02-04 (×2): qty 0.1

## 2015-02-04 MED ORDER — FAMOTIDINE 20 MG PO TABS
40.0000 mg | ORAL_TABLET | Freq: Two times a day (BID) | ORAL | Status: DC
  Administered 2015-02-04 – 2015-02-06 (×4): 40 mg
  Filled 2015-02-04 (×4): qty 2

## 2015-02-04 MED ORDER — PIPERACILLIN-TAZOBACTAM 3.375 G IVPB
3.3750 g | Freq: Three times a day (TID) | INTRAVENOUS | Status: DC
  Administered 2015-02-04 – 2015-02-06 (×6): 3.375 g via INTRAVENOUS
  Filled 2015-02-04 (×7): qty 50

## 2015-02-04 MED ORDER — PRO-STAT SUGAR FREE PO LIQD
30.0000 mL | Freq: Every day | ORAL | Status: DC
  Administered 2015-02-05 – 2015-02-06 (×2): 30 mL
  Filled 2015-02-04 (×2): qty 30

## 2015-02-04 MED ORDER — CHLORHEXIDINE GLUCONATE CLOTH 2 % EX PADS
6.0000 | MEDICATED_PAD | Freq: Every day | CUTANEOUS | Status: DC
  Administered 2015-02-05 – 2015-02-06 (×2): 6 via TOPICAL

## 2015-02-04 MED ORDER — ACETAMINOPHEN 500 MG PO TABS
500.0000 mg | ORAL_TABLET | Freq: Three times a day (TID) | ORAL | Status: DC
  Administered 2015-02-04 – 2015-02-06 (×4): 500 mg via ORAL
  Filled 2015-02-04 (×4): qty 1

## 2015-02-04 MED ORDER — PIPERACILLIN-TAZOBACTAM 3.375 G IVPB 30 MIN
3.3750 g | Freq: Once | INTRAVENOUS | Status: AC
  Administered 2015-02-04: 3.375 g via INTRAVENOUS
  Filled 2015-02-04: qty 50

## 2015-02-04 MED ORDER — SERTRALINE HCL 100 MG PO TABS
100.0000 mg | ORAL_TABLET | Freq: Every day | ORAL | Status: DC
  Administered 2015-02-05: 100 mg
  Filled 2015-02-04: qty 1

## 2015-02-04 MED ORDER — ENOXAPARIN SODIUM 40 MG/0.4ML ~~LOC~~ SOLN
40.0000 mg | SUBCUTANEOUS | Status: DC
  Administered 2015-02-04 – 2015-02-06 (×2): 40 mg via SUBCUTANEOUS
  Filled 2015-02-04 (×2): qty 0.4

## 2015-02-04 MED ORDER — VANCOMYCIN HCL 10 G IV SOLR
1500.0000 mg | Freq: Once | INTRAVENOUS | Status: AC
  Administered 2015-02-04: 1500 mg via INTRAVENOUS
  Filled 2015-02-04: qty 1500

## 2015-02-04 MED ORDER — ASPIRIN 81 MG PO CHEW
81.0000 mg | CHEWABLE_TABLET | Freq: Every day | ORAL | Status: DC
  Administered 2015-02-05 – 2015-02-06 (×2): 81 mg
  Filled 2015-02-04 (×2): qty 1

## 2015-02-04 MED ORDER — DIVALPROEX SODIUM 125 MG PO CSDR
250.0000 mg | DELAYED_RELEASE_CAPSULE | Freq: Two times a day (BID) | ORAL | Status: DC
  Administered 2015-02-04 – 2015-02-06 (×4): 250 mg via ORAL
  Filled 2015-02-04 (×5): qty 2

## 2015-02-04 MED ORDER — VANCOMYCIN HCL IN DEXTROSE 1-5 GM/200ML-% IV SOLN
1000.0000 mg | Freq: Two times a day (BID) | INTRAVENOUS | Status: DC
  Administered 2015-02-05 – 2015-02-06 (×4): 1000 mg via INTRAVENOUS
  Filled 2015-02-04 (×4): qty 200

## 2015-02-04 MED ORDER — OSMOLITE 1.5 CAL PO LIQD
80.0000 mL/h | ORAL | Status: DC
  Administered 2015-02-05: 80 mL/h
  Filled 2015-02-04 (×4): qty 1000

## 2015-02-04 MED ORDER — DIVALPROEX SODIUM 250 MG PO DR TAB: 250.0000 mg | DELAYED_RELEASE_TABLET | Freq: Two times a day (BID) | ORAL | Status: DC

## 2015-02-04 MED ORDER — SODIUM CHLORIDE 0.9 % IV SOLN
INTRAVENOUS | Status: DC
  Administered 2015-02-04: 75 mL/h via INTRAVENOUS
  Administered 2015-02-05 – 2015-02-06 (×2): via INTRAVENOUS

## 2015-02-04 MED ORDER — SODIUM CHLORIDE 0.9 % IJ SOLN
3.0000 mL | Freq: Two times a day (BID) | INTRAMUSCULAR | Status: DC
  Administered 2015-02-06: 3 mL via INTRAVENOUS

## 2015-02-04 MED ORDER — MUPIROCIN 2 % EX OINT
1.0000 "application " | TOPICAL_OINTMENT | Freq: Two times a day (BID) | CUTANEOUS | Status: DC
  Administered 2015-02-04 – 2015-02-06 (×4): 1 via NASAL
  Filled 2015-02-04: qty 22

## 2015-02-04 MED ORDER — LATANOPROST 0.005 % OP SOLN
1.0000 [drp] | Freq: Every day | OPHTHALMIC | Status: DC
  Administered 2015-02-06: 1 [drp] via OPHTHALMIC
  Filled 2015-02-04: qty 2.5

## 2015-02-04 MED ORDER — DEXTROSE 50 % IV SOLN
25.0000 mL | Freq: Once | INTRAVENOUS | Status: AC
  Administered 2015-02-04: 25 mL via INTRAVENOUS
  Filled 2015-02-04: qty 50

## 2015-02-04 MED ORDER — INSULIN ASPART 100 UNIT/ML ~~LOC~~ SOLN
0.0000 [IU] | SUBCUTANEOUS | Status: DC
  Administered 2015-02-05 – 2015-02-06 (×3): 2 [IU] via SUBCUTANEOUS

## 2015-02-04 NOTE — ED Notes (Addendum)
Inserted 16Fr F/C for critically ill pt.  In ED with assistance of Deandra, ED tech at bedside. Noted of amber urine output.  Pt tolerated wll.

## 2015-02-04 NOTE — H&P (Signed)
[facility] History and Physical  RHONDA VANGIESON ZOX:[id number] DOB: 1932/11/24 DOA: 2015/03/01  Referring physician: PCP:  Duane Lope, MD   Chief Complaint: Acute encephalopathy  HPI: Ricardo Rivera is a 81 y.o. male with a past medical history of cervical spondylosis with myelopathy status post anterior cervical discectomy and fusion of C3/C7 in April 2016, postoperative course, care by respiratory failure requiring intubation, status post tracheostomy after poor weaning efforts, status post PEG tube placement, dementia presenting as a transfer from  [facility] facility with mental status changes. Workup in the emergency department revealed lactic acid of 1.1, white count of 13.9, unremarkable urinalysis and chest x-ray which did not show acute cardiopulmonary disease. Patient was initially hypotensive with blood pressure of  90/44 that responded to IV fluid resuscitation. Patient is unable to provide history or participate in his own plan of care. History was obtained from emergency room staff.                                                      Review of Systems:  Unable to obtain reliable review of systems  Past Medical History  Diagnosis Date  . Hypertension   . Shortness of breath dyspnea   . Arthritis   . Hyperlipemia   . Frequent urination   . Nocturia   . Diabetes mellitus without complication   . Anxiety   . GERD (gastroesophageal reflux disease)    Past Surgical History  Procedure Laterality Date  . Spinal surgery      fusion  . Spinal fusion  1986  . Shoulder surgies Bilateral   . Lower back  1970  . Cholecystectomy  1998  . Tracheostomy tube placement N/A 10/01/2014    Procedure: TRACHEOSTOMY;  Surgeon: Flo Shanks, MD;  Location: [facility] OR;  Service: ENT;  Laterality: N/A;  . Direct laryngoscopy N/A 10/01/2014    Procedure: DIRECT LARYNGOSCOPY;  Surgeon: Flo Shanks, MD;  Location: [facility] OR;  Service: ENT;  Laterality: N/A;  . Anterior cervical  decompression/discectomy fusion 4 levels N/A 09/23/2014    Procedure: CERVICAL THREE-FOUR,CERVICAL FOUR-FIVE,CERVICAL FIVE-SIX,CERVICAL SIX-SEVEN,ANTERIOR CERVICAL DECOMPRESSION WITH FUSION INTERBODY PROSTHESIS PLATING AND BONEGRAFT.;  Surgeon: Lisbeth Renshaw, MD;  Location: [facility];  Service: Neurosurgery;  Laterality: N/A;   Social History:  reports that he has been smoking.  He has never used smokeless tobacco. He reports that he does not drink alcohol or use illicit drugs.  Allergies  Allergen Reactions  . Flexeril [Cyclobenzaprine] Other (See Comments)    Hallucinations  . Tramadol Other (See Comments)    Hallunications    Family History  Problem Relation Age of Onset  . Family history unknown: Yes   unable to obtain family history  Prior to Admission medications   Medication Sig Start Date End Date Taking? Authorizing Provider  acetaminophen (TYLENOL) 500 MG tablet Take 500 mg by mouth 3 (three) times daily.   Yes Historical Provider, MD  Amino Acids-Protein Hydrolys (FEEDING SUPPLEMENT, PRO-STAT SUGAR FREE 64,) LIQD 30 mLs by Gastric Tube route daily.   Yes Historical Provider, MD  antiseptic oral rinse (BIOTENE) LIQD 15 mLs by Mouth Rinse route 3 (three) times daily.   Yes Historical Provider, MD  aspirin 81 MG tablet 81 mg by Gastric Tube route daily.    Yes Historical Provider, MD  Cholecalciferol (VITAMIN  D) 2000 UNITS CAPS 2,000 Units by Gastric Tube route daily.    Yes Historical Provider, MD  clonazePAM (KLONOPIN) 0.5 MG tablet Take 0.25 mg by mouth daily as needed for anxiety.    Yes Historical Provider, MD  diclofenac sodium (VOLTAREN) 1 % GEL Apply 2 g topically 2 (two) times daily. Apply to hands, arms and shoulders.   Yes Historical Provider, MD  divalproex (DEPAKOTE) 250 MG DR tablet Take 250 mg by mouth 2 (two) times daily.   Yes Historical Provider, MD  famotidine (PEPCID) 40 MG tablet 40 mg by Gastric Tube route 2 (two) times daily.    Yes Historical  Provider, MD  insulin aspart (NOVOLOG) 100 UNIT/ML FlexPen Inject 3 Units into the skin every 6 (six) hours. 3 units for CBG greater than 150 before meals   Yes Historical Provider, MD  Insulin Glargine (LANTUS) 100 UNIT/ML Solostar Pen Inject 10 Units into the skin daily at 10 pm. Give at bedtime   Yes Historical Provider, MD  latanoprost (XALATAN) 0.005 % ophthalmic solution Place 1 drop into both eyes at bedtime.   Yes Historical Provider, MD  losartan (COZAAR) 50 MG tablet 50 mg by Gastric Tube route daily.    Yes Historical Provider, MD  Multiple Vitamins-Minerals (DECUBI-VITE) CAPS Take 2 capsules by mouth daily. Via G tube once daily as supplement   Yes Historical Provider, MD  Nutritional Supplements (FEEDING SUPPLEMENT, OSMOLITE 1.5 CAL,) LIQD Place 80 mL/hr into feeding tube every 18 (eighteen) hours.    Yes Historical Provider, MD  oxybutynin (DITROPAN) 5 MG tablet 5 mg by Gastric Tube route daily.    Yes Historical Provider, MD  PRESCRIPTION MEDICATION 200 mLs. Give 200 ml flush every 4 hours via tube every 4 hours   Yes Historical Provider, MD  risperiDONE (RISPERDAL) 0.25 MG tablet 0.25 mg by Gastric Tube route daily.    Yes Historical Provider, MD  risperiDONE (RISPERDAL) 0.5 MG tablet 0.5 mg by Gastric Tube route at bedtime.    Yes Historical Provider, MD  sertraline (ZOLOFT) 100 MG tablet 100 mg by Gastric Tube route daily.   Yes Historical Provider, MD  traZODone (DESYREL) 50 MG tablet Take 50 mg by mouth at bedtime.   Yes Historical Provider, MD  chlorhexidine (PERIDEX) 0.12 % solution 15 mLs by Mouth Rinse route 2 (two) times daily. Patient not taking: Reported on 01/11/2015 10/05/14   Lisbeth Renshaw, MD  diazepam (VALIUM) 5 MG tablet Take 1 tablet (5 mg total) by mouth every 6 (six) hours as needed for muscle spasms. Patient not taking: Reported on 11/28/2014 10/05/14   Lisbeth Renshaw, MD  docusate sodium (COLACE) 100 MG capsule Take 1 capsule (100 mg total) by mouth daily as  needed for mild constipation. Patient not taking: Reported on 11/28/2014 10/05/14   Lisbeth Renshaw, MD  heparin 5000 UNIT/ML injection Inject 1 mL (5,000 Units total) into the skin every 8 (eight) hours. Patient not taking: Reported on 11/28/2014 10/05/14   Lisbeth Renshaw, MD  methylPREDNIsolone (MEDROL DOSPACK) 4 MG tablet follow package directions Patient not taking: Reported on 11/28/2014 10/05/14   Lisbeth Renshaw, MD  metoprolol (LOPRESSOR) 1 MG/ML injection Inject 2.5-5 mLs (2.5-5 mg total) into the vein every 3 (three) hours as needed. Patient not taking: Reported on 11/28/2014 10/05/14   Lisbeth Renshaw, MD  Nutritional Supplements (FEEDING SUPPLEMENT, VITAL AF 1.2 CAL,) LIQD Place 1,000 mLs into feeding tube continuous. Patient not taking: Reported on 11/28/2014 10/05/14   Lisbeth Renshaw, MD  Omega-3 Fatty  Acids (FISH OIL) 1000 MG CPDR Take 2,000 mg by mouth daily.    Historical Provider, MD  senna (SENOKOT) 8.6 MG TABS tablet Take 1 tablet (8.6 mg total) by mouth daily as needed for mild constipation. Patient not taking: Reported on 11/28/2014 10/05/14   Lisbeth Renshaw, MD  Water For Irrigation, Sterile (FREE WATER) SOLN Place 100 mLs into feeding tube every 8 (eight) hours. Patient not taking: Reported on 15-Feb-2015 10/05/14   Lisbeth Renshaw, MD   Physical Exam: Filed Vitals:   Feb 15, 2015 1330 02/15/2015 1400 02/15/15 1430 Feb 15, 2015 1500  BP: 126/65 107/54 103/65 107/54  Pulse:  101 103 95  Temp:      TempSrc:      Resp: 13 18 18 14   Height:      Weight:      SpO2:  94% 95% 93%    Wt Readings from Last 3 Encounters:  2015/02/15 81.647 kg (180 lb)  12/14/14 75.751 kg (167 lb)  12/08/14 81.647 kg (180 lb)    General:  Patient is encephalopathic, arousable however cannot follow commands, or answer questions appropriately. Chronically ill-appearing. Eyes: PERRL, normal lids, irises & conjunctiva ENT: grossly normal hearing, lips & tongue, having dry oral mucosa Neck: no LAD, masses  or thyromegaly Cardiovascular: Tachycardic, RRR, no m/r/g. No LE edema. Telemetry: SR, no arrhythmias  Respiratory: CTA bilaterally, no w/r/r. Normal respiratory effort. Abdomen: soft, ntnd, status post PEG tube placement Skin: no rash or induration seen on limited exam Musculoskeletal: grossly normal tone BUE/BLE, bilateral muscle atrophy Psychiatric: Patient is encephalopathic, nonverbal, not following commands Neurologic: I cannot perform adequate neurologic exam due to encephalopathy. He was not able to follow commands in the emergency room. He appear to move all extremities           Labs on Admission:  Basic Metabolic Panel:  Recent Labs Lab 02-15-15 1220  NA 135  K 3.7  CL 103  CO2 26  GLUCOSE 137*  BUN 30*  CREATININE 0.72  CALCIUM 8.1*   Liver Function Tests:  Recent Labs Lab 2015/02/15 1220  AST 31  ALT 21  ALKPHOS 66  BILITOT 0.6  PROT 7.4  ALBUMIN 2.3*   No results for input(s): LIPASE, AMYLASE in the last 168 hours. No results for input(s): AMMONIA in the last 168 hours. CBC:  Recent Labs Lab 02/15/2015 1220  WBC 13.9*  NEUTROABS 11.1*  HGB 9.9*  HCT 29.3*  MCV 85.7  PLT 210   Cardiac Enzymes: No results for input(s): CKTOTAL, CKMB, CKMBINDEX, TROPONINI in the last 168 hours.  BNP (last 3 results) No results for input(s): BNP in the last 8760 hours.  ProBNP (last 3 results) No results for input(s): PROBNP in the last 8760 hours.  CBG: No results for input(s): GLUCAP in the last 168 hours.  Radiological Exams on Admission: Dg Chest Port 1 View  02-15-15   CLINICAL DATA:  Pt arrived via EMS from [facility] on [address] with altered mental status, agitation, generalized weakness and fever. Hx HTN, SOB dyspnea, diabetes, GERD.  EXAM: PORTABLE CHEST - 1 VIEW  COMPARISON:  12/13/2014  FINDINGS: Cardiac silhouette normal in size and configuration. No mediastinal or hilar masses or convincing adenopathy.  Linear opacity noted in the lung  bases most likely due to atelectasis. No lung consolidation or convincing edema. No pleural effusion or pneumothorax.  There has been a previous long anterior cervical spine fusion, stable. Bony thorax is demineralized.  IMPRESSION: No acute cardiopulmonary disease.  Electronically Signed   By: Amie Portland M.D.   On: 02/03/2015 14:36    EKG: Independently reviewed.   Assessment/Plan Principal Problem:   Acute encephalopathy Active Problems:   FTT (failure to thrive) in adult   1. Acute encephalopathy. Patient with history of dementia presenting as a transfer from his skilled nursing facility with significant mental status changes. He was found to be agitated at his facility was given Klonopin prior to arrival. Initial lab data suggesting underlying infectious process having a white count of 13,900 and a rectal temp of 100.6. His lactate was 1.1, as chest x-ray and urinalysis were unremarkable. This possibly could be secondary to developing pneumonia, either from HCAP or aspiration. Will provide broad-spectrum empiric IV antibiotic coverage with vancomycin and Zosyn, plan to recheck x-ray in a.m., IV fluid resuscitation and supportive care. 2. Hypotension. Patient initially presenting with a blood pressure of 90/44 that quickly responded to IV fluid resuscitation. I think he may be volume depleted which could have resulted from functional decline. Will discontinue Cozaar.  3. Failure to thrive. Patient presenting with a steep functional decline. It is possible underlying infectious process may be the precipitating factor. He is also on multiple psychotropic medications, to include Valium, Zoloft, Ditropan, and Clonazepam. During my evaluation he is sedated and difficult to arouse. Will stop Risperdal and ditropan for now. He may require anti-psychotic therapy for sundowning, will reassess. 4. Insulin-dependent diabetes mellitus. Will continue Lantus 10 units subcutaneous daily at bedtime. Provide  Accu-Cheks every 4 hours 5. Tube feeds. Plan to continue to feeds at home rate of 80 mL's per hour with free water flushes 3 times a day 6. Probable dehydration. Patient receiving bolus of normal saline in the emergency department, will continue maintenance fluids at 75 mL/hour   Code Status: CODE STATUS was discussed with his wife Love Milbourne over telephone conversation, she reported that patient is a DNR and would not want to undergo cardiopulmonary resuscitation or heroic measures. DO NOT RESUSCITATE order placed in chart DVT Prophylaxis: Lovenox Family Communication: I spoke to his wife Merrel Crabbe over telephone Disposition Plan: Will admit to inpatient service, anticipate will require greater than 2 nights hospitalization  Time spent: 70 min  Jeralyn Bennett [facility] Pager [phone]

## 2015-02-04 NOTE — ED Notes (Signed)
Awake. Verbally responsive. A/O x4. Resp even and unlabored. No audible adventitious breath sounds noted. ABC's intact. SR on monitor. IV infusing Vancomycin and Zosyn without difficulty.

## 2015-02-04 NOTE — ED Provider Notes (Signed)
CSN: [id number]     Arrival date & time 02/24/2015  1155 History   First MD Initiated Contact with Patient 02/22/2015 1200     Chief Complaint  Patient presents with  . Altered Mental Status     (Consider location/radiation/quality/duration/timing/severity/associated sxs/prior Treatment) Patient is a 81 y.o. male presenting with general illness. The history is provided by the patient.  Illness Severity:  Moderate Onset quality:  Sudden Duration:  2 days Timing:  Constant Progression:  Worsening Chronicity:  New Associated symptoms: fatigue and fever    81 yo M with a chief complaint of agitation and fever. Patient is normally not verbal at baseline. At the past couple days of the nursing home has become increasing agitated and warm. Recently diagnosed with pneumonia about a month ago with resolution. Per nursing home no vomiting no diarrhea. On EMS arrival patient's blood pressure was in the 90s heart rate in the 120s. This improved with 2 500 mL boluses of fluid  Level V caveat altered mental status  Past Medical History  Diagnosis Date  . Hypertension   . Shortness of breath dyspnea   . Arthritis   . Hyperlipemia   . Frequent urination   . Nocturia   . Diabetes mellitus without complication   . Anxiety   . GERD (gastroesophageal reflux disease)    Past Surgical History  Procedure Laterality Date  . Spinal surgery      fusion  . Spinal fusion  1986  . Shoulder surgies Bilateral   . Lower back  1970  . Cholecystectomy  1998  . Tracheostomy tube placement N/A 10/01/2014    Procedure: TRACHEOSTOMY;  Surgeon: Flo Shanks, MD;  Location: [facility] OR;  Service: ENT;  Laterality: N/A;  . Direct laryngoscopy N/A 10/01/2014    Procedure: DIRECT LARYNGOSCOPY;  Surgeon: Flo Shanks, MD;  Location: [facility] OR;  Service: ENT;  Laterality: N/A;  . Anterior cervical decompression/discectomy fusion 4 levels N/A 09/23/2014    Procedure: CERVICAL THREE-FOUR,CERVICAL FOUR-FIVE,CERVICAL  FIVE-SIX,CERVICAL SIX-SEVEN,ANTERIOR CERVICAL DECOMPRESSION WITH FUSION INTERBODY PROSTHESIS PLATING AND BONEGRAFT.;  Surgeon: Lisbeth Renshaw, MD;  Location: [facility];  Service: Neurosurgery;  Laterality: N/A;   Family History  Problem Relation Age of Onset  . Family history unknown: Yes   History  Substance Use Topics  . Smoking status: Current Some Day Smoker -- 0.25 packs/day for 50 years  . Smokeless tobacco: Never Used  . Alcohol Use: No    Review of Systems  Unable to perform ROS: Patient nonverbal  Constitutional: Positive for fever and fatigue.      Allergies  Flexeril and Tramadol  Home Medications   Prior to Admission medications   Medication Sig Start Date End Date Taking? Authorizing Provider  acetaminophen (TYLENOL) 500 MG tablet Take 500 mg by mouth 3 (three) times daily.   Yes Historical Provider, MD  Amino Acids-Protein Hydrolys (FEEDING SUPPLEMENT, PRO-STAT SUGAR FREE 64,) LIQD 30 mLs by Gastric Tube route daily.   Yes Historical Provider, MD  antiseptic oral rinse (BIOTENE) LIQD 15 mLs by Mouth Rinse route 3 (three) times daily.   Yes Historical Provider, MD  aspirin 81 MG tablet 81 mg by Gastric Tube route daily.    Yes Historical Provider, MD  Cholecalciferol (VITAMIN D) 2000 UNITS CAPS 2,000 Units by Gastric Tube route daily.    Yes Historical Provider, MD  clonazePAM (KLONOPIN) 0.5 MG tablet Take 0.25 mg by mouth daily as needed for anxiety.    Yes Historical Provider, MD  diclofenac sodium (  VOLTAREN) 1 % GEL Apply 2 g topically 2 (two) times daily. Apply to hands, arms and shoulders.   Yes Historical Provider, MD  divalproex (DEPAKOTE) 250 MG DR tablet Take 250 mg by mouth 2 (two) times daily.   Yes Historical Provider, MD  famotidine (PEPCID) 40 MG tablet 40 mg by Gastric Tube route 2 (two) times daily.    Yes Historical Provider, MD  insulin aspart (NOVOLOG) 100 UNIT/ML FlexPen Inject 3 Units into the skin every 6 (six) hours. 3 units for CBG  greater than 150 before meals   Yes Historical Provider, MD  Insulin Glargine (LANTUS) 100 UNIT/ML Solostar Pen Inject 10 Units into the skin daily at 10 pm. Give at bedtime   Yes Historical Provider, MD  latanoprost (XALATAN) 0.005 % ophthalmic solution Place 1 drop into both eyes at bedtime.   Yes Historical Provider, MD  losartan (COZAAR) 50 MG tablet 50 mg by Gastric Tube route daily.    Yes Historical Provider, MD  Multiple Vitamins-Minerals (DECUBI-VITE) CAPS Take 2 capsules by mouth daily. Via G tube once daily as supplement   Yes Historical Provider, MD  Nutritional Supplements (FEEDING SUPPLEMENT, OSMOLITE 1.5 CAL,) LIQD Place 80 mL/hr into feeding tube every 18 (eighteen) hours.    Yes Historical Provider, MD  oxybutynin (DITROPAN) 5 MG tablet 5 mg by Gastric Tube route daily.    Yes Historical Provider, MD  PRESCRIPTION MEDICATION 200 mLs. Give 200 ml flush every 4 hours via tube every 4 hours   Yes Historical Provider, MD  risperiDONE (RISPERDAL) 0.25 MG tablet 0.25 mg by Gastric Tube route daily.    Yes Historical Provider, MD  risperiDONE (RISPERDAL) 0.5 MG tablet 0.5 mg by Gastric Tube route at bedtime.    Yes Historical Provider, MD  sertraline (ZOLOFT) 100 MG tablet 100 mg by Gastric Tube route daily.   Yes Historical Provider, MD  traZODone (DESYREL) 50 MG tablet Take 50 mg by mouth at bedtime.   Yes Historical Provider, MD  chlorhexidine (PERIDEX) 0.12 % solution 15 mLs by Mouth Rinse route 2 (two) times daily. Patient not taking: Reported on 01/11/2015 10/05/14   Lisbeth Renshaw, MD  diazepam (VALIUM) 5 MG tablet Take 1 tablet (5 mg total) by mouth every 6 (six) hours as needed for muscle spasms. Patient not taking: Reported on 11/28/2014 10/05/14   Lisbeth Renshaw, MD  docusate sodium (COLACE) 100 MG capsule Take 1 capsule (100 mg total) by mouth daily as needed for mild constipation. Patient not taking: Reported on 11/28/2014 10/05/14   Lisbeth Renshaw, MD  heparin 5000 UNIT/ML  injection Inject 1 mL (5,000 Units total) into the skin every 8 (eight) hours. Patient not taking: Reported on 11/28/2014 10/05/14   Lisbeth Renshaw, MD  methylPREDNIsolone (MEDROL DOSPACK) 4 MG tablet follow package directions Patient not taking: Reported on 11/28/2014 10/05/14   Lisbeth Renshaw, MD  metoprolol (LOPRESSOR) 1 MG/ML injection Inject 2.5-5 mLs (2.5-5 mg total) into the vein every 3 (three) hours as needed. Patient not taking: Reported on 11/28/2014 10/05/14   Lisbeth Renshaw, MD  Nutritional Supplements (FEEDING SUPPLEMENT, VITAL AF 1.2 CAL,) LIQD Place 1,000 mLs into feeding tube continuous. Patient not taking: Reported on 11/28/2014 10/05/14   Lisbeth Renshaw, MD  Omega-3 Fatty Acids (FISH OIL) 1000 MG CPDR Take 2,000 mg by mouth daily.    Historical Provider, MD  senna (SENOKOT) 8.6 MG TABS tablet Take 1 tablet (8.6 mg total) by mouth daily as needed for mild constipation. Patient not taking: Reported on  11/28/2014 10/05/14   Lisbeth Renshaw, MD  Water For Irrigation, Sterile (FREE WATER) SOLN Place 100 mLs into feeding tube every 8 (eight) hours. Patient not taking: Reported on 02/24/2015 10/05/14   Lisbeth Renshaw, MD   BP 107/54 mmHg  Pulse 95  Temp(Src) 100.6 F (38.1 C) (Rectal)  Resp 14  Ht 6' (1.829 m)  Wt 180 lb (81.647 kg)  BMI 24.41 kg/m2  SpO2 93% Physical Exam  Constitutional: He appears cachectic.  Chronically ill appearing  HENT:  Head: Normocephalic and atraumatic.  Dry mucous membranes  Eyes: EOM are normal. Pupils are equal, round, and reactive to light.  Neck: Normal range of motion. Neck supple. No JVD present.  Cardiovascular: Normal rate and regular rhythm.  Exam reveals no gallop and no friction rub.   No murmur heard. Pulmonary/Chest: No respiratory distress. He has no wheezes.  Abdominal: He exhibits no distension. There is no rebound and no guarding.  Musculoskeletal: Normal range of motion.  Neurological: He is alert.  Skin: No rash noted. No  pallor.  Psychiatric: He has a normal mood and affect. His behavior is normal.    ED Course  Procedures (including critical care time) Labs Review Labs Reviewed  COMPREHENSIVE METABOLIC PANEL - Abnormal; Notable for the following:    Glucose, Bld 137 (*)    BUN 30 (*)    Calcium 8.1 (*)    Albumin 2.3 (*)    All other components within normal limits  CBC WITH DIFFERENTIAL/PLATELET - Abnormal; Notable for the following:    WBC 13.9 (*)    RBC 3.42 (*)    Hemoglobin 9.9 (*)    HCT 29.3 (*)    RDW 15.8 (*)    Neutrophils Relative % 80 (*)    Neutro Abs 11.1 (*)    Lymphocytes Relative 11 (*)    Monocytes Absolute 1.3 (*)    All other components within normal limits  URINALYSIS, ROUTINE W REFLEX MICROSCOPIC (NOT AT [facility]) - Abnormal; Notable for the following:    Color, Urine AMBER (*)    Protein, ur 30 (*)    Leukocytes, UA SMALL (*)    All other components within normal limits  CULTURE, BLOOD (ROUTINE X 2)  CULTURE, BLOOD (ROUTINE X 2)  URINE CULTURE  URINE MICROSCOPIC-ADD ON  I-STAT CG4 LACTIC ACID, ED  I-STAT CG4 LACTIC ACID, ED    Imaging Review Dg Chest Port 1 View  02/01/2015   CLINICAL DATA:  Pt arrived via EMS from [facility] on [address] with altered mental status, agitation, generalized weakness and fever. Hx HTN, SOB dyspnea, diabetes, GERD.  EXAM: PORTABLE CHEST - 1 VIEW  COMPARISON:  12/13/2014  FINDINGS: Cardiac silhouette normal in size and configuration. No mediastinal or hilar masses or convincing adenopathy.  Linear opacity noted in the lung bases most likely due to atelectasis. No lung consolidation or convincing edema. No pleural effusion or pneumothorax.  There has been a previous long anterior cervical spine fusion, stable. Bony thorax is demineralized.  IMPRESSION: No acute cardiopulmonary disease.   Electronically Signed   By: Amie Portland M.D.   On: 01/30/2015 14:36     EKG Interpretation   Date/Time:  Saturday February 04 2015 12:35:15  EDT Ventricular Rate:  102 PR Interval:  127 QRS Duration: 136 QT Interval:  351 QTC Calculation: 457 R Axis:   71 Text Interpretation:  Sinus tachycardia Ventricular premature complex  Right bundle branch block No significant change was found Confirmed by  Atisha Hamidi MD, Reuel Boom [phone]) on 02/20/2015 12:57:05 PM      MDM   Final diagnoses:  Fever    81 yo M with a chief complaint of agitation fever. Likely due to infection will obtain a chest x-ray urine CBC CMP blood cultures fluid resuscitate. Initial BP 90/44.   Patient with improvement of blood pressure with 3 L of fluid was given.  Patient with leukocytosis. Lab work with no identifiable area of infection. Patient was covered with VAC and Zosyn. Will admit to telemetry.  CRITICAL CARE Performed by: Rae Roam   Total critical care time: 30  Critical care time was exclusive of separately billable procedures and treating other patients.  Critical care was necessary to treat or prevent imminent or life-threatening deterioration.  Critical care was time spent personally by me on the following activities: development of treatment plan with patient and/or surrogate as well as nursing, discussions with consultants, evaluation of patient's response to treatment, examination of patient, obtaining history from patient or surrogate, ordering and performing treatments and interventions, ordering and review of laboratory studies, ordering and review of radiographic studies, pulse oximetry and re-evaluation of patient's condition.  The patients results and plan were reviewed and discussed.   Any x-rays performed were independently reviewed by myself.   Differential diagnosis were considered with the presenting HPI.  Medications  sodium chloride 0.9 % bolus 2,000 mL (0 mLs Intravenous Stopped 02/16/2015 1330)  vancomycin (VANCOCIN) 1,500 mg in sodium chloride 0.9 % 500 mL IVPB (1,500 mg Intravenous New Bag/Given 02/18/2015 1332)   piperacillin-tazobactam (ZOSYN) IVPB 3.375 g (0 g Intravenous Stopped 02/05/2015 1402)    Filed Vitals:   02/17/2015 1330 02/09/2015 1400 02/08/2015 1430 02/23/2015 1500  BP: 126/65 107/54 103/65 107/54  Pulse:  101 103 95  Temp:      TempSrc:      Resp: 13 18 18 14   Height:      Weight:      SpO2:  94% 95% 93%    Final diagnoses:  Fever    Admission/ observation were discussed with the admitting physician, patient and/or family and they are comfortable with the plan.    Melene Plan, DO 02/03/2015 1551

## 2015-02-04 NOTE — Progress Notes (Signed)
ANTIBIOTIC CONSULT NOTE - INITIAL  Pharmacy Consult for vancomycin/zosyn Indication: pneumonia  Allergies  Allergen Reactions  . Flexeril [Cyclobenzaprine] Other (See Comments)    Hallucinations  . Tramadol Other (See Comments)    Hallunications    Patient Measurements: Height: 6' (182.9 cm) Weight: 161 lb 2.5 oz (73.1 kg) IBW/kg (Calculated) : 77.6 Adjusted Body Weight:   Vital Signs: Temp: 98.2 F (36.8 C) (08/06 1605) Temp Source: Axillary (08/06 1605) BP: 129/57 mmHg (08/06 1605) Pulse Rate: 94 (08/06 1605) Intake/Output from previous day:   Intake/Output from this shift: Total I/O In: 2500 [I.V.:2500] Out: 1000 [Urine:1000]  Labs:  Recent Labs  01/30/2015 1220  WBC 13.9*  HGB 9.9*  PLT 210  CREATININE 0.72   Estimated Creatinine Clearance: 76.1 mL/min (by C-G formula based on Cr of 0.72). No results for input(s): VANCOTROUGH, VANCOPEAK, VANCORANDOM, GENTTROUGH, GENTPEAK, GENTRANDOM, TOBRATROUGH, TOBRAPEAK, TOBRARND, AMIKACINPEAK, AMIKACINTROU, AMIKACIN in the last 72 hours.   Microbiology: No results found for this or any previous visit (from the past 720 hour(s)).  Medical History: Past Medical History  Diagnosis Date  . Hypertension   . Shortness of breath dyspnea   . Arthritis   . Hyperlipemia   . Frequent urination   . Nocturia   . Diabetes mellitus without complication   . Anxiety   . GERD (gastroesophageal reflux disease)    Assessment: 55 YOM presents from SNF with AMS changes. Code sepsis initiated. Vancomycin and zosyn ordered x 1 in ED. Temperature in ED 100.6. Pharmacy asked to continue vancomycin and zosyn dosing for pneumonia  8/6 >> vanco >>  8/6 >> zosyn >>   8/6 blood:  8/6 urine:   Renal: SCr WNL, norm CrCl = 58ml/min (using SCr = 0.8mg /dl) WBC elevated   Dose changes/levels:  Goal of Therapy:  Vancomycin trough level 15-20 mcg/ml  Plan:   Vancomycin  IV x1 (in ED) then 1gm IV q12h  Follow renal  function  Trough if remains on vancomycin > 3 days  Zosyn 3.375gm IV q8h over 4h infusion  Narrow antibiotics as appropriate, await cultures  Juliette Alcide, PharmD, BCPS.   Pager: [phone] 02/02/2015,4:21 PM

## 2015-02-04 NOTE — Progress Notes (Signed)
CRITICAL VALUE ALERT  Critical value received:  MRSA PCR positive  Date of notification:  03/03/15   Time of notification:  2015  Critical value read back:Yes.    Nurse who received alert:  Tina Griffiths, RN  MD notified (1st page):  Lenny Pastel, NP  Time of first page:  2039  MD notified (2nd page): n/a  Time of second page: n/a  Responding MD:  Lenny Pastel, NP  Time MD responded:  2041

## 2015-02-04 NOTE — ED Notes (Signed)
Awake. Verbally responsive. A/O x4. Resp even and unlabored. No audible adventitious breath sounds noted. ABC's intact. SR on monitor. IV infusing Vancomycin without difficulty. No adverse reaction from Zosyn ABT noted.

## 2015-02-04 NOTE — ED Notes (Signed)
Bed: WA07 Expected date:  Expected time:  Means of arrival:  Comments: EMS- agitation, fever

## 2015-02-04 NOTE — ED Notes (Signed)
Pt arrived via EMS from [facility] on [address] with altered mental status, agitation and generalized weakness. Facility reported rt lymph node swelling and recent tx to PNA in past 2 weeks.  Pt was given of NS via EMS. Initial VS: HR-120 and BP-82/50 with improvement after given NS. Pt was given Klonopin and Tylenol prior to arrival.

## 2015-02-05 ENCOUNTER — Inpatient Hospital Stay (HOSPITAL_COMMUNITY): Payer: Medicare Other

## 2015-02-05 DIAGNOSIS — G9340 Encephalopathy, unspecified: Secondary | ICD-10-CM

## 2015-02-05 LAB — URINE CULTURE: Culture: NO GROWTH

## 2015-02-05 LAB — CBC
HEMATOCRIT: 28.4 % — AB (ref 39.0–52.0)
HEMOGLOBIN: 9.1 g/dL — AB (ref 13.0–17.0)
MCH: 27.7 pg (ref 26.0–34.0)
MCHC: 32 g/dL (ref 30.0–36.0)
MCV: 86.6 fL (ref 78.0–100.0)
Platelets: 182 10*3/uL (ref 150–400)
RBC: 3.28 MIL/uL — AB (ref 4.22–5.81)
RDW: 16.1 % — AB (ref 11.5–15.5)
WBC: 15.7 10*3/uL — ABNORMAL HIGH (ref 4.0–10.5)

## 2015-02-05 LAB — GLUCOSE, CAPILLARY
GLUCOSE-CAPILLARY: 76 mg/dL (ref 65–99)
GLUCOSE-CAPILLARY: 89 mg/dL (ref 65–99)
GLUCOSE-CAPILLARY: 153 mg/dL — AB (ref 65–99)
GLUCOSE-CAPILLARY: 118 mg/dL — AB (ref 65–99)
Glucose-Capillary: 127 mg/dL — ABNORMAL HIGH (ref 65–99)

## 2015-02-05 LAB — BASIC METABOLIC PANEL
ANION GAP: 5 (ref 5–15)
BUN: 21 mg/dL — ABNORMAL HIGH (ref 6–20)
CHLORIDE: 109 mmol/L (ref 101–111)
CO2: 23 mmol/L (ref 22–32)
Calcium: 8.1 mg/dL — ABNORMAL LOW (ref 8.9–10.3)
Creatinine, Ser: 0.5 mg/dL — ABNORMAL LOW (ref 0.61–1.24)
GFR calc Af Amer: >60 mL/min (ref >60)
GLUCOSE: 87 mg/dL (ref 65–99)
POTASSIUM: 3.4 mmol/L — AB (ref 3.5–5.1)
SODIUM: 137 mmol/L (ref 135–145)

## 2015-02-05 MED ORDER — DEXTROSE 50 % IV SOLN
25.0000 mL | Freq: Once | INTRAVENOUS | Status: AC
  Administered 2015-02-05: 25 mL via INTRAVENOUS
  Filled 2015-02-05: qty 50

## 2015-02-05 MED ORDER — LEVALBUTEROL HCL 0.63 MG/3ML IN NEBU
0.6300 mg | INHALATION_SOLUTION | Freq: Four times a day (QID) | RESPIRATORY_TRACT | Status: DC | PRN
  Filled 2015-02-05: qty 3

## 2015-02-05 MED ORDER — INSULIN GLARGINE 100 UNIT/ML ~~LOC~~ SOLN
7.0000 [IU] | Freq: Every day | SUBCUTANEOUS | Status: DC
  Administered 2015-02-06: 7 [IU] via SUBCUTANEOUS
  Filled 2015-02-05 (×2): qty 0.07

## 2015-02-05 MED ORDER — LORAZEPAM 2 MG/ML IJ SOLN
0.5000 mg | Freq: Once | INTRAMUSCULAR | Status: AC
  Administered 2015-02-05: 0.5 mg via INTRAVENOUS
  Filled 2015-02-05: qty 1

## 2015-02-05 NOTE — Progress Notes (Signed)
Patient Demographics  Ricardo Rivera, is a 81 y.o. male, DOB - 12-18-1932, ZOX:[id number]  Admit date - 02/26/2015   Admitting Physician Jeralyn Bennett, MD  Outpatient Primary MD for the patient is  Duane Lope, MD  LOS - 1   Chief Complaint  Patient presents with  . Altered Mental Status       Admission HPI/Brief narrative: 81 year-old male with history of dementia, hypertension, diabetes, History of cervical spondylosis with myelopathy status post cervical discectomy on April 2016, postoperative course complicated by respiratory failure, tracheostomy and PEG tube placement , brought from SNF secondary to acute encephalopathy , workup significant for bacteremia and UTI .  Subjective:   Lucrezia Europe with advanced dementia, can give any complaints .  Assessment & Plan    Principal Problem:   Acute encephalopathy Active Problems:   FTT (failure to thrive) in adult   Dehydration   Hypotension   Dementia with behavioral disturbance   acute encephalopathy  - Patient has very poor mental status at baseline as per wife , not even recognize her , this is most likely worsened due to acute infectious process , unclear if acute delirium contributing to it or not yet .  Sepsis  - She meets sepsis criteria on admission giving his leukocytosis and hypotension , responded to IV fluids  - So far septic workup significant for cultures growing gram-positive cocci, and a positive urinalysis. - We'll continue with broad-spectrum antibiotics including vancomycin and Zosyn for now .   dementia with behavioral disturbances  - Continue Depakote, so far no need for anti-psychotics   Dehydration and failure to thrive - Continue with tube feeds - Continue with IV fluids   Diabetes mellitus  - With insulin sliding scale, crease Lantus to 7 units at bedtime given CBGs being on the lower side   Code Status:  DNR  Family Communication: discussed with wife over the phone  Disposition Plan: back to SNF once stable    Procedures  None    Consults   None    Medications  Scheduled Meds: . acetaminophen  500 mg Oral TID  . aspirin  81 mg Per Tube Daily  . Chlorhexidine Gluconate Cloth  6 each Topical Q0600  . divalproex  250 mg Oral Q12H  . enoxaparin (LOVENOX) injection  40 mg Subcutaneous Q24H  . famotidine  40 mg Per Tube BID  . feeding supplement (OSMOLITE 1.5 CAL)  80 mL/hr Per Tube Q24H  . feeding supplement (PRO-STAT SUGAR FREE 64)  30 mL Per Tube Daily  . free water  200 mL Per Tube 3 times per day  . insulin aspart  0-15 Units Subcutaneous 6 times per day  . insulin glargine  10 Units Subcutaneous QHS  . latanoprost  1 drop Both Eyes QHS  . mupirocin ointment  1 application Nasal BID  . piperacillin-tazobactam (ZOSYN)  IV  3.375 g Intravenous 3 times per day  . sertraline  100 mg Per Tube Daily  . sodium chloride  3 mL Intravenous Q12H  . vancomycin  1,000 mg Intravenous BID   Continuous Infusions: . sodium chloride 75 mL/hr at 02/05/15 0705   PRN Meds:.  DVT Prophylaxis  Lovenox -  Lab Results  Component Value  Date   PLT 182 02/05/2015    Antibiotics    Anti-infectives    Start     Dose/Rate Route Frequency Ordered Stop   02/05/15 0000  vancomycin (VANCOCIN) IVPB 1000 mg/200 mL premix     1,000 mg 200 mL/hr over 60 Minutes Intravenous 2 times daily 02/12/2015 1630     02/19/2015 2200  piperacillin-tazobactam (ZOSYN) IVPB 3.375 g     3.375 g 12.5 mL/hr over 240 Minutes Intravenous 3 times per day 02/12/2015 1630     02/16/2015 1400  vancomycin (VANCOCIN) 1,500 mg in sodium chloride 0.9 % 500 mL IVPB     1,500 mg 250 mL/hr over 120 Minutes Intravenous  Once 02/15/2015 1310 02/21/2015 1900   02/22/2015 1315  piperacillin-tazobactam (ZOSYN) IVPB 3.375 g     3.375 g 100 mL/hr over 30 Minutes Intravenous  Once 02/10/2015 1310 02/05/2015 1402          Objective:   Filed  Vitals:   02/05/2015 1500 02/13/2015 1605 02/02/2015 2132 02/05/15 0416  BP: 107/54 129/57 130/75 124/63  Pulse: 95 94 108 89  Temp:  98.2 F (36.8 C) 98 F (36.7 C) 98.7 F (37.1 C)  TempSrc:  Axillary Axillary Oral  Resp: 14  16 16   Height:  6' (1.829 m)    Weight:  73.1 kg (161 lb 2.5 oz)    SpO2: 93% 98% 96% 93%    Wt Readings from Last 3 Encounters:  02/07/2015 73.1 kg (161 lb 2.5 oz)  12/14/14 75.751 kg (167 lb)  12/08/14 81.647 kg (180 lb)     Intake/Output Summary (Last 24 hours) at 02/05/15 1412 Last data filed at 02/05/15 1052  Gross per 24 hour  Intake   4110 ml  Output   1650 ml  Net   2460 ml     Physical  elderly male, arousable, noncommunicative Supple Neck,No JVD, history site healed and closed, No cervical lymphadenopathy appriciated.  Symmetrical Chest wall movement, Good air movement bilaterally,  No Gallops,Rubs or new Murmurs, No Parasternal Heave +ve B.Sounds, Abd Soft, No tenderness,PEG + No Cyanosis, Clubbing or edema,    Data Review   Micro Results Recent Results (from the past 240 hour(s))  Blood Culture (routine x 2)     Status: None (Preliminary result)   Collection Time: 03/01/2015 12:03 PM  Result Value Ref Range Status   Specimen Description BLOOD LEFT HAND  Final   Special Requests BOTTLES DRAWN AEROBIC AND ANAEROBIC 5CC  Final   Culture  Setup Time   Final    GRAM POSITIVE COCCI IN CLUSTERS AEROBIC BOTTLE ONLY CRITICAL RESULT CALLED TO, READ BACK BY AND VERIFIED WITH: A STEWART 02/05/15 @ 0715 M VESTAL    Culture   Final    GRAM POSITIVE COCCI Performed at [facility]    Report Status PENDING  Incomplete  Blood Culture (routine x 2)     Status: None (Preliminary result)   Collection Time: 02/03/2015 12:23 PM  Result Value Ref Range Status   Specimen Description BLOOD RIGHT FOREARM  Final   Special Requests BOTTLES DRAWN AEROBIC AND ANAEROBIC 5CC  Final   Culture   Final    NO GROWTH < 24 HOURS Performed at [facility]    Report Status PENDING  Incomplete  Urine culture     Status: None   Collection Time: 02/27/2015 12:50 PM  Result Value Ref Range Status   Specimen Description URINE, CATHETERIZED  Final   Special Requests NONE  Final   Culture   Final    NO GROWTH 1 DAY Performed at [facility]    Report Status 02/05/2015 FINAL  Final  MRSA PCR Screening     Status: Abnormal   Collection Time: 01/30/2015  4:40 PM  Result Value Ref Range Status   MRSA by PCR POSITIVE (A) NEGATIVE Final    Comment:        The GeneXpert MRSA Assay (FDA approved for NASAL specimens only), is one component of a comprehensive MRSA colonization surveillance program. It is not intended to diagnose MRSA infection nor to guide or monitor treatment for MRSA infections. RESULT CALLED TO, READ BACK BY AND VERIFIED WITHEvans Lance RN 2015 008/06/2015 [facility]     Radiology Reports Dg Chest 1 View  02/05/2015   CLINICAL DATA:  Pneumonia.  EXAM: CHEST  1 VIEW  COMPARISON:  02/04/2015 and 12/13/2014.  FINDINGS: 0512 hr. The heart size and mediastinal contours are stable with aortic atherosclerosis. There are chronic low lung volumes with chronic lung disease manifesting as increased perihilar and basilar pulmonary opacities bilaterally. No superimposed airspace disease or pleural effusion identified. The bones appear unchanged status post multilevel cervical fusion.  IMPRESSION: Stable chronic lung disease. No acute superimposed process demonstrated.   Electronically Signed   By: Carey Bullocks M.D.   On: 02/05/2015 07:04   Ct Head Wo Contrast  01/11/2015   CLINICAL DATA:  Status post fall.  Laceration.  EXAM: CT HEAD WITHOUT CONTRAST  TECHNIQUE: Contiguous axial images were obtained from the base of the skull through the vertex without intravenous contrast.  COMPARISON:  01/06/2015  FINDINGS: There is no evidence of mass effect, midline shift, or extra-axial fluid collections. There is no evidence of a  space-occupying lesion or intracranial hemorrhage. There is no evidence of a cortical-based area of acute infarction. There is generalized cerebral atrophy. There is periventricular white matter low attenuation likely secondary to microangiopathy.  The ventricles and sulci are appropriate for the patient's age. The basal cisterns are patent.  Visualized portions of the orbits are unremarkable. The visualized portions of the paranasal sinuses and mastoid air cells are unremarkable. Cerebrovascular atherosclerotic calcifications are noted.  The osseous structures are unremarkable. There is a left periorbital soft tissue hematoma.  IMPRESSION: 1. No acute intracranial pathology. 2. Chronic microvascular disease and cerebral atrophy.   Electronically Signed   By: Elige Ko   On: 01/11/2015 20:07   Ct Head Wo Contrast  01/06/2015   CLINICAL DATA:  Fall. Head and neck injury and pain. Previous spinal fusion.  EXAM: CT HEAD WITHOUT CONTRAST  CT CERVICAL SPINE WITHOUT CONTRAST  TECHNIQUE: Multidetector CT imaging of the head and cervical spine was performed following the standard protocol without intravenous contrast. Multiplanar CT image reconstructions of the cervical spine were also generated.  COMPARISON:  12/02/2014  FINDINGS: CT HEAD FINDINGS  There is no evidence of intracranial hemorrhage, brain edema, or other signs of acute infarction. There is no evidence of intracranial mass lesion or mass effect. No abnormal extraaxial fluid collections are identified.  Moderate cerebral atrophy and chronic small vessel disease are unchanged in appearance. Ventricles stable in size. No evidence of skull fracture.  CT CERVICAL SPINE FINDINGS  Hardware from anterior cervical disc fusion again seen from levels of C3-C7. Cervical spine alignment remains normal. No evidence of acute cervical spine fracture. Mild to moderate bilateral facet DJD again demonstrated, left side greater than right. Atlantoaxial degenerative changes  also appears stable.  No other significant bone abnormality identified.  IMPRESSION: No acute intracranial abnormality. Stable cerebral atrophy and chronic small vessel disease.  No evidence of acute cervical spine fracture or subluxation. Degenerative spondylosis and prior anterior cervical disc fusion from C3-C7 again seen.   Electronically Signed   By: Myles Rosenthal M.D.   On: 01/06/2015 19:55   Ct Cervical Spine Wo Contrast  01/06/2015   CLINICAL DATA:  Fall. Head and neck injury and pain. Previous spinal fusion.  EXAM: CT HEAD WITHOUT CONTRAST  CT CERVICAL SPINE WITHOUT CONTRAST  TECHNIQUE: Multidetector CT imaging of the head and cervical spine was performed following the standard protocol without intravenous contrast. Multiplanar CT image reconstructions of the cervical spine were also generated.  COMPARISON:  12/02/2014  FINDINGS: CT HEAD FINDINGS  There is no evidence of intracranial hemorrhage, brain edema, or other signs of acute infarction. There is no evidence of intracranial mass lesion or mass effect. No abnormal extraaxial fluid collections are identified.  Moderate cerebral atrophy and chronic small vessel disease are unchanged in appearance. Ventricles stable in size. No evidence of skull fracture.  CT CERVICAL SPINE FINDINGS  Hardware from anterior cervical disc fusion again seen from levels of C3-C7. Cervical spine alignment remains normal. No evidence of acute cervical spine fracture. Mild to moderate bilateral facet DJD again demonstrated, left side greater than right. Atlantoaxial degenerative changes also appears stable. No other significant bone abnormality identified.  IMPRESSION: No acute intracranial abnormality. Stable cerebral atrophy and chronic small vessel disease.  No evidence of acute cervical spine fracture or subluxation. Degenerative spondylosis and prior anterior cervical disc fusion from C3-C7 again seen.   Electronically Signed   By: Myles Rosenthal M.D.   On: 01/06/2015 19:55    Dg Chest Port 1 View  03-02-15   CLINICAL DATA:  Pt arrived via EMS from [facility] on [address] with altered mental status, agitation, generalized weakness and fever. Hx HTN, SOB dyspnea, diabetes, GERD.  EXAM: PORTABLE CHEST - 1 VIEW  COMPARISON:  12/13/2014  FINDINGS: Cardiac silhouette normal in size and configuration. No mediastinal or hilar masses or convincing adenopathy.  Linear opacity noted in the lung bases most likely due to atelectasis. No lung consolidation or convincing edema. No pleural effusion or pneumothorax.  There has been a previous long anterior cervical spine fusion, stable. Bony thorax is demineralized.  IMPRESSION: No acute cardiopulmonary disease.   Electronically Signed   By: Amie Portland M.D.   On: 02-Mar-2015 14:36     CBC  Recent Labs Lab 02-Mar-2015 1220 02/05/15 0508  WBC 13.9* 15.7*  HGB 9.9* 9.1*  HCT 29.3* 28.4*  PLT 210 182  MCV 85.7 86.6  MCH 28.9 27.7  MCHC 33.8 32.0  RDW 15.8* 16.1*  LYMPHSABS 1.6  --   MONOABS 1.3*  --   EOSABS 0.0  --   BASOSABS 0.0  --     Chemistries   Recent Labs Lab 03/02/15 1220 02/05/15 0508  NA 135 137  K 3.7 3.4*  CL 103 109  CO2 26 23  GLUCOSE 137* 87  BUN 30* 21*  CREATININE 0.72 0.50*  CALCIUM 8.1* 8.1*  AST 31  --   ALT 21  --   ALKPHOS 66  --   BILITOT 0.6  --    ------------------------------------------------------------------------------------------------------------------ estimated creatinine clearance is 76.1 mL/min (by C-G formula based on Cr of 0.5). ------------------------------------------------------------------------------------------------------------------ No results for input(s): HGBA1C in the last 72 hours. ------------------------------------------------------------------------------------------------------------------ No results for input(s): CHOL,  HDL, LDLCALC, TRIG, CHOLHDL, LDLDIRECT in the last 72  hours. ------------------------------------------------------------------------------------------------------------------ No results for input(s): TSH, T4TOTAL, T3FREE, THYROIDAB in the last 72 hours.  Invalid input(s): FREET3 ------------------------------------------------------------------------------------------------------------------ No results for input(s): VITAMINB12, FOLATE, FERRITIN, TIBC, IRON, RETICCTPCT in the last 72 hours.  Coagulation profile No results for input(s): INR, PROTIME in the last 168 hours.  No results for input(s): DDIMER in the last 72 hours.  Cardiac Enzymes No results for input(s): CKMB, TROPONINI, MYOGLOBIN in the last 168 hours.  Invalid input(s): CK ------------------------------------------------------------------------------------------------------------------ Invalid input(s): POCBNP     Time Spent in minutes   30 minutes   Rayyan Burley M.D on 02/05/2015 at 2:12 PM  Between 7am to 7pm - Pager - [phone]  After 7pm go to [email or web address] - password [facility]  [facility]   Office  [phone]

## 2015-02-05 NOTE — Progress Notes (Signed)
CRITICAL VALUE ALERT  Critical value received:  Blood culture positive, aerobic bottle, Gram-positive cocci in clusters  Date of notification:  02/05/2015   Time of notification:  0715  Critical value read back:Yes.    Nurse who received alert:  Tina Griffiths, RN  MD notified (1st page):  Huey Bienenstock, MD  Time of first page:  [phone]  MD notified (2nd page): n/a  Time of second page: n/a  Responding MD:  Huey Bienenstock, MD  Time MD responded:  725

## 2015-02-05 NOTE — Progress Notes (Signed)
Utilization review completed.  

## 2015-02-06 ENCOUNTER — Inpatient Hospital Stay (HOSPITAL_COMMUNITY): Payer: Medicare Other

## 2015-02-06 DIAGNOSIS — L8990 Pressure ulcer of unspecified site, unspecified stage: Secondary | ICD-10-CM | POA: Insufficient documentation

## 2015-02-06 DIAGNOSIS — E43 Unspecified severe protein-calorie malnutrition: Secondary | ICD-10-CM | POA: Insufficient documentation

## 2015-02-06 LAB — CBC
HCT: 24.9 % — ABNORMAL LOW (ref 39.0–52.0)
Hemoglobin: 8.2 g/dL — ABNORMAL LOW (ref 13.0–17.0)
MCH: 28.4 pg (ref 26.0–34.0)
MCHC: 32.9 g/dL (ref 30.0–36.0)
MCV: 86.2 fL (ref 78.0–100.0)
PLATELETS: 171 10*3/uL (ref 150–400)
RBC: 2.89 MIL/uL — ABNORMAL LOW (ref 4.22–5.81)
RDW: 16.1 % — ABNORMAL HIGH (ref 11.5–15.5)
WBC: 16.5 10*3/uL — ABNORMAL HIGH (ref 4.0–10.5)

## 2015-02-06 LAB — BASIC METABOLIC PANEL
ANION GAP: 3 — AB (ref 5–15)
BUN: 25 mg/dL — ABNORMAL HIGH (ref 6–20)
CO2: 23 mmol/L (ref 22–32)
Calcium: 8.1 mg/dL — ABNORMAL LOW (ref 8.9–10.3)
Chloride: 108 mmol/L (ref 101–111)
Creatinine, Ser: 0.49 mg/dL — ABNORMAL LOW (ref 0.61–1.24)
GFR calc Af Amer: >60 mL/min (ref >60)
GFR calc non Af Amer: >60 mL/min (ref >60)
GLUCOSE: 95 mg/dL (ref 65–99)
POTASSIUM: 3.1 mmol/L — AB (ref 3.5–5.1)
SODIUM: 134 mmol/L — AB (ref 135–145)

## 2015-02-06 LAB — GLUCOSE, CAPILLARY
GLUCOSE-CAPILLARY: 121 mg/dL — AB (ref 65–99)
GLUCOSE-CAPILLARY: 95 mg/dL (ref 65–99)
Glucose-Capillary: 87 mg/dL (ref 65–99)
Glucose-Capillary: 132 mg/dL — ABNORMAL HIGH (ref 65–99)

## 2015-02-06 LAB — VANCOMYCIN, TROUGH: Vancomycin Tr: 10 ug/mL (ref 10.0–20.0)

## 2015-02-06 MED ORDER — POTASSIUM CHLORIDE 20 MEQ/15ML (10%) PO SOLN: 40.0000 meq | Freq: Two times a day (BID) | ORAL | Status: DC

## 2015-02-06 MED ORDER — SERTRALINE HCL 20 MG/ML PO CONC
100.0000 mg | Freq: Every day | ORAL | Status: DC
Start: 2015-02-06 — End: 2015-02-06
  Administered 2015-02-06: 100 mg
  Filled 2015-02-06: qty 5

## 2015-02-06 MED ORDER — ACETAMINOPHEN 160 MG/5ML PO SOLN
500.0000 mg | Freq: Three times a day (TID) | ORAL | Status: DC
  Administered 2015-02-06: 500 mg
  Filled 2015-02-06: qty 20.3

## 2015-02-06 MED ORDER — VANCOMYCIN HCL 500 MG IV SOLR
500.0000 mg | INTRAVENOUS | Status: DC
  Filled 2015-02-06: qty 500

## 2015-02-06 MED ORDER — SODIUM CHLORIDE 0.9 % IV SOLN
1250.0000 mg | Freq: Two times a day (BID) | INTRAVENOUS | Status: DC
Start: 2015-02-07 — End: 2015-02-06
  Filled 2015-02-06: qty 1250

## 2015-02-06 MED ORDER — OSMOLITE 1.5 CAL PO LIQD
80.0000 mL/h | ORAL | Status: DC
  Administered 2015-02-06: 80 mL/h
  Filled 2015-02-06: qty 237

## 2015-02-06 MED ORDER — POTASSIUM CHLORIDE 20 MEQ PO PACK: 40.0000 meq | PACK | Freq: Two times a day (BID) | ORAL | Status: DC

## 2015-02-07 LAB — CULTURE, BLOOD (ROUTINE X 2)

## 2015-02-07 NOTE — Accreditation Note (Signed)
o Restraints reported to CMS  Pursuant to regulation 482.13 (G) (3) use of restraints was logged and CMS was notified via email on 08.09.2016 at 0719 by Rosine Door, RN, Patient Safety and Accreditation.

## 2015-02-09 LAB — CULTURE, BLOOD (ROUTINE X 2): Culture: NO GROWTH

## 2015-02-09 NOTE — Progress Notes (Signed)
Patient with congested breath sounds, unable to clear with coughing and deep breathing, patient very restless and unable to calm with non invasive interventions. MD paged. Orders received. Respiratory to come assist with suction. Will continue to monitor.

## 2015-02-11 LAB — CULTURE, BLOOD (ROUTINE X 2)
CULTURE: NO GROWTH
CULTURE: NO GROWTH

## 2015-03-02 NOTE — Progress Notes (Signed)
RT NT suctioned patient and got tan a large amount of secretions. Patient tolerated okay.

## 2015-03-02 NOTE — Discharge Summary (Signed)
Brief death summary: Cause of death: Cardiopulmonary arrest Due to: Sepsis Due to: Staphylococcus aureus bacteremia  Hospital course: Please review progress note dictated on 02/01/2015 , patient was admitted with sepsis workup was significant for bacteremia,  patient looked culture was growing Staphylococcus aureus, patient  was on appropriate coverage with vancomycin and Zosyn,  discussed with infectious disease over the phone,  and official consult was pending at time of death, patient had cardiopulmonary arrest, patient was DO NOT RESUSCITATE, family were notified, scars with son, stepdaughter, all their questions were answered. Huey Bienenstock MD

## 2015-03-02 NOTE — Progress Notes (Signed)
RT NT suctioned patient and obtained large amount of tan pink tinged secretions. Patient tolerated well and is resting. RT will continue to monitor.

## 2015-03-02 NOTE — Progress Notes (Signed)
NTS for large amount of tan/red secretions.  Pt tolerated fairly well.

## 2015-03-02 NOTE — Progress Notes (Signed)
Patient was becoming more agitated and restless. Starting to squirm more in the bed with concern for falling out of bed even with ankle restraints in place. Received verbal order to initiate wrist and continue ankle restraints. Wrist restraint applied. Julio Sicks RN

## 2015-03-02 NOTE — Progress Notes (Signed)
Patient had order for wrist and ankle soft restraints. Restraints were in place at time of transport/death. RN went to the room when transporter arrived at 14:05 to saline lock IV and removed floor mats from around the bed. Lab tech came into room to draw blood. At this time the patient was alert and squirming in the bed. After lab tech finished, transporter took patient from the room to the elevators. RN left the room and went on lunch break. At around 14:20, NT came into lounge and made RN aware of death. RN immediately went out to the patient's room. PACU RN was present and reported: Per transporter, "patient did not look good in the elevator." When the transporter and patient reached the first floor, the transporter asked the PACU RN for help assessing the patient. A pulse was assessed but not felt. Patient was brought back to 1425 and PACU RN confirmed death by auscultation with stethoscope.  MD was made aware and the family was notified.

## 2015-03-02 NOTE — Progress Notes (Signed)
Initial Nutrition Assessment  DOCUMENTATION CODES:   Severe malnutrition in context of chronic illness  INTERVENTION:  - Recommend continue facility TF regimen: Osmolite 1.5 @ 80 mL/hr for 18 hours/day with 30 mL Prostat once/day - RD will continue to monitor for needs  NUTRITION DIAGNOSIS:   Malnutrition related to chronic illness as evidenced by moderate depletions of muscle mass, severe depletion of muscle mass, moderate depletion of body fat.  GOAL:   Patient will meet greater than or equal to 90% of their needs  MONITOR:   TF tolerance, Weight trends, Labs, I & O's  REASON FOR ASSESSMENT:   Other (Comment) (new TF)  ASSESSMENT:   81 y.o. male with a past medical history of cervical spondylosis with myelopathy status post anterior cervical discectomy and fusion of C3/C7 in April 2016, postoperative course, care by respiratory failure requiring intubation, status post tracheostomy after poor weaning efforts, status post PEG tube placement, dementia presenting as a transfer from [place] living facility with mental status changes. Workup in the emergency department revealed lactic acid of 1.1, white count of 13.9, unremarkable urinalysis and chest x-ray which did not show acute cardiopulmonary disease. Patient was initially hypotensive with blood pressure of 90/44 that responded to IV fluid resuscitation. Patient is unable to provide history or participate in his own plan of care.   Pt seen for new TF. BMI indicates normal weight status. Pt is nonverbal and no family present. Per notes, he has been agitated. Bilateral mitten restraints in place at time of visit; did not perform physical assessment but wasting noted by visualization of pt.   Per chart review, pt has lost 19 lbs (10.5% body weight) in the past two months which is significant for time frame.  Per chart review, pt receives Osmolite 1.5 @ 80 mL/hr for 18 hours with 30 mL Prostat once/day at [facility]. This regimen  provides 2260 kcal, 105 grams protein, and 1097 mL free water. Per rounds this AM, RN states that this is what pt is receiving here despite TF order stating over 24 hours.  Meeting needs and additional kcal and protein beneficial for malnurioushed pt. Medications reviewed. Labs reviewed; CBGs: 76-153 mg/dL, K: 3.1 mmol/L, BUN elevated, creatinine low, Ca: 8.1 mg/dL.   Diet Order:  Diet NPO time specified  Skin:  Wound (see comment) (stage 1 ulcers to medial sacrum and bilateral scrotum, L hip wound)  Last BM:  8/8  Height:   Ht Readings from Last 1 Encounters:  02-26-2015 6' (1.829 m)    Weight:   Wt Readings from Last 1 Encounters:  02/26/15 161 lb 2.5 oz (73.1 kg)    Ideal Body Weight:  80.91 kg (kg)  BMI:  Body mass index is 21.85 kg/(m^2).  Estimated Nutritional Needs:   Kcal:  1825-2190  Protein:  85-100 grams  Fluid:  2.2 L/day  EDUCATION NEEDS:   No education needs identified at this time     Trenton Gammon, RD, LDN Inpatient Clinical Dietitian Pager # [phone] After hours/weekend pager # [phone]

## 2015-03-02 NOTE — Care Management Important Message (Signed)
Important Message  Patient Details IM Letter given to Cookie/ Case Manager to present to patientImportant Message  Patient Details  Name: Ricardo Rivera MRN: [id number] Date of Birth: 1932-10-22   Medicare Important Message Given:  Yes-second notification given    Haskell Flirt 01/31/2015, 1:01 PM Name: Ricardo Rivera MRN: [id number] Date of Birth: Feb 20, 1933   Medicare Important Message Given:  Yes-second notification given    Haskell Flirt 02/04/2015, 1:00 PM

## 2015-03-02 NOTE — Progress Notes (Signed)
ANTIBIOTIC CONSULT NOTE - FOLLOW UP  Pharmacy Consult for vancomycin/zosyn Indication: pneumonia, bacteremia  Allergies  Allergen Reactions  . Flexeril [Cyclobenzaprine] Other (See Comments)    Hallucinations  . Tramadol Other (See Comments)    Hallunications    Patient Measurements: Height: 6' (182.9 cm) Weight: 161 lb 2.5 oz (73.1 kg) IBW/kg (Calculated) : 77.6  Vital Signs: Temp: 97.5 F (36.4 C) (08/08 1300) Temp Source: Axillary (08/08 1300) BP: 147/74 mmHg (08/08 1300) Pulse Rate: 107 (08/08 1300) Intake/Output from previous day: 08/07 0701 - 08/08 0700 In: 3470 [P.O.:100; I.V.:900; NG/GT:1920; IV Piggyback:550] Out: 725 [Urine:725] Intake/Output from this shift: Total I/O In: -  Out: 200 [Urine:200]  Labs:  Recent Labs  2015/03/02 1220 02/05/15 0508 03/01/2015 0421  WBC 13.9* 15.7* 16.5*  HGB 9.9* 9.1* 8.2*  PLT 210 182 171  CREATININE 0.72 0.50* 0.49*   Estimated Creatinine Clearance: 76.1 mL/min (by C-G formula based on Cr of 0.49).  Recent Labs  02/28/2015 1125  VANCOTROUGH 10     Microbiology: Recent Results (from the past 720 hour(s))  Blood Culture (routine x 2)     Status: None (Preliminary result)   Collection Time: 03-02-2015 12:03 PM  Result Value Ref Range Status   Specimen Description BLOOD LEFT HAND  Final   Special Requests BOTTLES DRAWN AEROBIC AND ANAEROBIC 5CC  Final   Culture  Setup Time   Final    GRAM POSITIVE COCCI IN CLUSTERS AEROBIC BOTTLE ONLY CRITICAL RESULT CALLED TO, READ BACK BY AND VERIFIED WITH: A STEWART 02/05/15 @ 0715 M VESTAL    Culture   Final    STAPHYLOCOCCUS AUREUS Performed at [facility]    Report Status PENDING  Incomplete  Blood Culture (routine x 2)     Status: None (Preliminary result)   Collection Time: 03-02-15 12:23 PM  Result Value Ref Range Status   Specimen Description BLOOD RIGHT FOREARM  Final   Special Requests BOTTLES DRAWN AEROBIC AND ANAEROBIC 5CC  Final   Culture   Final    NO  GROWTH < 24 HOURS Performed at [facility]    Report Status PENDING  Incomplete  Urine culture     Status: None   Collection Time: 03/02/15 12:50 PM  Result Value Ref Range Status   Specimen Description URINE, CATHETERIZED  Final   Special Requests NONE  Final   Culture   Final    NO GROWTH 1 DAY Performed at [facility]    Report Status 02/05/2015 FINAL  Final  MRSA PCR Screening     Status: Abnormal   Collection Time: March 02, 2015  4:40 PM  Result Value Ref Range Status   MRSA by PCR POSITIVE (A) NEGATIVE Final    Comment:        The GeneXpert MRSA Assay (FDA approved for NASAL specimens only), is one component of a comprehensive MRSA colonization surveillance program. It is not intended to diagnose MRSA infection nor to guide or monitor treatment for MRSA infections. RESULT CALLED TO, READ BACK BY AND VERIFIED WITH: Evans Lance RN 2015 March 02, 2015 A NAVARRO     Assessment: 82 YOM presents from SNF with AMS changes. Code sepsis initiated. Vancomycin and zosyn ordered x 1 in ED. Temperature in ED 100.6. Pharmacy asked to continue vancomycin and zosyn dosing for pneumonia  8/6 >> vancomycin >>  8/6 >> zosyn >>   8/6 blood: 1 of 2 GPC in clusters 8/6 urine: NGF 8/8 blood: sent  Renal: SCr WNL, norm  CrCl = 35ml/min (using SCr = 0.8mg /dl) WBC elevated   Dose changes/levels: 8/8 trough = 10 mcg/ml before 5th total dose  Goal of Therapy:  Vancomycin trough level 15-20 mcg/ml  Antibiotic doses appropriate for indication and renal function  Plan:   Give an additional 500mg  vancomycin IV x 1 dose now, then  Increase vancomycin to 1250mg  IV q12h  Recheck trough at new steady state  Zosyn 3.375gm IV q8h over 4h infusion  Narrow antibiotics as appropriate, await final cultures, ID consult  Loralee Pacas, PharmD, BCPS Pager: [phone]  02-13-2015,1:13 PM

## 2015-03-02 NOTE — Progress Notes (Addendum)
Patient Demographics  Ricardo Rivera, is a 81 y.o. male, DOB - 1932-08-02, ZOX:[id number]  Admit date - 02-27-15   Admitting Physician Jeralyn Bennett, MD  Outpatient Primary MD for the patient is  Duane Lope, MD  LOS - 2   Chief Complaint  Patient presents with  . Altered Mental Status       Admission HPI/Brief narrative: 81 year-old male with history of dementia, hypertension, diabetes, History of cervical spondylosis with myelopathy status post cervical discectomy on April 2016, postoperative course complicated by respiratory failure, tracheostomy and PEG tube placement , brought from SNF secondary to acute encephalopathy , workup significant for bacteremia and UTI .  Subjective:   Ricardo Rivera with advanced dementia, can give any complaints .  Assessment & Plan    Principal Problem:   Acute encephalopathy Active Problems:   FTT (failure to thrive) in adult   Dehydration   Hypotension   Dementia with behavioral disturbance   Protein-calorie malnutrition, severe   Pressure ulcer   acute encephalopathy  - Patient has very poor mental status at baseline as per wife ,can't even recognize her , this is most likely worsened due to acute infectious process , unclear if acute delirium contributing to it or not yet .  Sepsis  - he meets sepsis criteria on admission giving his fever,  leukocytosis and hypotension , hypertension responded to IV fluids  - Blood cultures : aerobic bottle growing Staphylococcus aureus, awaiting susceptibility,  - requested ID consult - Shin to the right neck swelling, we'll check CT neck with without contrast to evaluate for possible abscess, significant infection   dementia with behavioral disturbances  - Continue Depakote, so far no need for anti-psychotics ,  Dehydration / failure to thrive/ severe protein malnutrition - Continue with tube feeds - Continue  with IV fluids  - Nutrition following  hypokalemia - Will replete, recheck in a.m.  Diabetes mellitus  - CBGs currently acceptable on Lantus and insulin sliding scale.  Sacral pressure ulcer - Continue with pressure ulcer precautions  Code Status: DNR  Family Communication: discussed with wife over the phone 8/7  Disposition Plan: back to SNF once stable    Procedures  None    Consults   None    Medications  Scheduled Meds: . acetaminophen (TYLENOL) oral liquid 160 mg/5 mL  500 mg Per Tube 3 times per day  . aspirin  81 mg Per Tube Daily  . Chlorhexidine Gluconate Cloth  6 each Topical Q0600  . divalproex  250 mg Oral Q12H  . enoxaparin (LOVENOX) injection  40 mg Subcutaneous Q24H  . famotidine  40 mg Per Tube BID  . feeding supplement (OSMOLITE 1.5 CAL)  80 mL/hr Per Tube Q24H  . feeding supplement (PRO-STAT SUGAR FREE 64)  30 mL Per Tube Daily  . free water  200 mL Per Tube 3 times per day  . insulin aspart  0-15 Units Subcutaneous 6 times per day  . insulin glargine  7 Units Subcutaneous QHS  . latanoprost  1 drop Both Eyes QHS  . mupirocin ointment  1 application Nasal BID  . piperacillin-tazobactam (ZOSYN)  IV  3.375 g Intravenous 3 times per day  . sertraline  100 mg Per Tube Daily  .  sodium chloride  3 mL Intravenous Q12H  . vancomycin  1,000 mg Intravenous BID   Continuous Infusions: . sodium chloride 75 mL/hr at 22-Feb-2015 0334   PRN Meds:.  DVT Prophylaxis  Lovenox -  Lab Results  Component Value Date   PLT 171 2015-02-22    Antibiotics    Anti-infectives    Start     Dose/Rate Route Frequency Ordered Stop   02/05/15 0000  vancomycin (VANCOCIN) IVPB 1000 mg/200 mL premix     1,000 mg 200 mL/hr over 60 Minutes Intravenous 2 times daily 02/05/2015 1630     03/01/2015 2200  piperacillin-tazobactam (ZOSYN) IVPB 3.375 g     3.375 g 12.5 mL/hr over 240 Minutes Intravenous 3 times per day 02/21/2015 1630     03/01/2015 1400  vancomycin (VANCOCIN) 1,500  mg in sodium chloride 0.9 % 500 mL IVPB     1,500 mg 250 mL/hr over 120 Minutes Intravenous  Once 02/13/2015 1310 01/30/2015 1900   03/01/2015 1315  piperacillin-tazobactam (ZOSYN) IVPB 3.375 g     3.375 g 100 mL/hr over 30 Minutes Intravenous  Once 02/03/2015 1310 02/22/2015 1402          Objective:   Filed Vitals:   02/05/15 0416 02/05/15 1445 02/05/15 1956 2015/02/22 0434  BP: 124/63 122/49 126/53 131/63  Pulse: 89 98 101 95  Temp: 98.7 F (37.1 C) 97.8 F (36.6 C) 99.2 F (37.3 C) 98 F (36.7 C)  TempSrc: Oral Axillary Axillary Axillary  Resp: 16 18 20 20   Height:      Weight:      SpO2: 93% 92% 96% 92%    Wt Readings from Last 3 Encounters:  02/20/2015 73.1 kg (161 lb 2.5 oz)  12/14/14 75.751 kg (167 lb)  12/08/14 81.647 kg (180 lb)     Intake/Output Summary (Last 24 hours) at Feb 22, 2015 1300 Last data filed at Feb 22, 2015 0700  Gross per 24 hour  Intake   3270 ml  Output    725 ml  Net   2545 ml     Physical  elderly male, arousable, noncommunicative Right neck swelling ,No JVD, history site healed and closed,  Symmetrical Chest wall movement, Good air movement bilaterally,  No Gallops,Rubs or new Murmurs, No Parasternal Heave +ve B.Sounds, Abd Soft, No tenderness,PEG + No Cyanosis, Clubbing or edema, sacral pressure ulcer+   Data Review   Micro Results Recent Results (from the past 240 hour(s))  Blood Culture (routine x 2)     Status: None (Preliminary result)   Collection Time: 02/28/2015 12:03 PM  Result Value Ref Range Status   Specimen Description BLOOD LEFT HAND  Final   Special Requests BOTTLES DRAWN AEROBIC AND ANAEROBIC 5CC  Final   Culture  Setup Time   Final    GRAM POSITIVE COCCI IN CLUSTERS AEROBIC BOTTLE ONLY CRITICAL RESULT CALLED TO, READ BACK BY AND VERIFIED WITH: A STEWART 02/05/15 @ 0715 M VESTAL    Culture   Final    STAPHYLOCOCCUS AUREUS Performed at [facility]    Report Status PENDING  Incomplete  Blood Culture (routine x 2)      Status: None (Preliminary result)   Collection Time: 02/09/2015 12:23 PM  Result Value Ref Range Status   Specimen Description BLOOD RIGHT FOREARM  Final   Special Requests BOTTLES DRAWN AEROBIC AND ANAEROBIC 5CC  Final   Culture   Final    NO GROWTH < 24 HOURS Performed at [facility]    Report  Status PENDING  Incomplete  Urine culture     Status: None   Collection Time: 02/27/2015 12:50 PM  Result Value Ref Range Status   Specimen Description URINE, CATHETERIZED  Final   Special Requests NONE  Final   Culture   Final    NO GROWTH 1 DAY Performed at [facility]    Report Status 02/05/2015 FINAL  Final  MRSA PCR Screening     Status: Abnormal   Collection Time: 02/17/2015  4:40 PM  Result Value Ref Range Status   MRSA by PCR POSITIVE (A) NEGATIVE Final    Comment:        The GeneXpert MRSA Assay (FDA approved for NASAL specimens only), is one component of a comprehensive MRSA colonization surveillance program. It is not intended to diagnose MRSA infection nor to guide or monitor treatment for MRSA infections. RESULT CALLED TO, READ BACK BY AND VERIFIED WITHEvans Lance RN 2015 008/12/2014 A [facility]     Radiology Reports Dg Chest 1 View  02/05/2015   CLINICAL DATA:  Pneumonia.  EXAM: CHEST  1 VIEW  COMPARISON:  02/17/2015 and 12/13/2014.  FINDINGS: 0512 hr. The heart size and mediastinal contours are stable with aortic atherosclerosis. There are chronic low lung volumes with chronic lung disease manifesting as increased perihilar and basilar pulmonary opacities bilaterally. No superimposed airspace disease or pleural effusion identified. The bones appear unchanged status post multilevel cervical fusion.  IMPRESSION: Stable chronic lung disease. No acute superimposed process demonstrated.   Electronically Signed   By: Carey Bullocks M.D.   On: 02/05/2015 07:04   Ct Head Wo Contrast  01/11/2015   CLINICAL DATA:  Status post fall.  Laceration.  EXAM: CT HEAD WITHOUT  CONTRAST  TECHNIQUE: Contiguous axial images were obtained from the base of the skull through the vertex without intravenous contrast.  COMPARISON:  01/06/2015  FINDINGS: There is no evidence of mass effect, midline shift, or extra-axial fluid collections. There is no evidence of a space-occupying lesion or intracranial hemorrhage. There is no evidence of a cortical-based area of acute infarction. There is generalized cerebral atrophy. There is periventricular white matter low attenuation likely secondary to microangiopathy.  The ventricles and sulci are appropriate for the patient's age. The basal cisterns are patent.  Visualized portions of the orbits are unremarkable. The visualized portions of the paranasal sinuses and mastoid air cells are unremarkable. Cerebrovascular atherosclerotic calcifications are noted.  The osseous structures are unremarkable. There is a left periorbital soft tissue hematoma.  IMPRESSION: 1. No acute intracranial pathology. 2. Chronic microvascular disease and cerebral atrophy.   Electronically Signed   By: Elige Ko   On: 01/11/2015 20:07   Dg Chest Port 1 View  02/17/2015   CLINICAL DATA:  Pt arrived via EMS from [facility] on [address] with altered mental status, agitation, generalized weakness and fever. Hx HTN, SOB dyspnea, diabetes, GERD.  EXAM: PORTABLE CHEST - 1 VIEW  COMPARISON:  12/13/2014  FINDINGS: Cardiac silhouette normal in size and configuration. No mediastinal or hilar masses or convincing adenopathy.  Linear opacity noted in the lung bases most likely due to atelectasis. No lung consolidation or convincing edema. No pleural effusion or pneumothorax.  There has been a previous long anterior cervical spine fusion, stable. Bony thorax is demineralized.  IMPRESSION: No acute cardiopulmonary disease.   Electronically Signed   By: Amie Portland M.D.   On: 02/15/2015 14:36     CBC  Recent Labs Lab 02/04/15 1220  02/05/15 0508 02/08/2015 0421  WBC 13.9*  15.7* 16.5*  HGB 9.9* 9.1* 8.2*  HCT 29.3* 28.4* 24.9*  PLT 210 182 171  MCV 85.7 86.6 86.2  MCH 28.9 27.7 28.4  MCHC 33.8 32.0 32.9  RDW 15.8* 16.1* 16.1*  LYMPHSABS 1.6  --   --   MONOABS 1.3*  --   --   EOSABS 0.0  --   --   BASOSABS 0.0  --   --     Chemistries   Recent Labs Lab 02/19/15 1220 02/05/15 0508 02/26/2015 0421  NA 135 137 134*  K 3.7 3.4* 3.1*  CL 103 109 108  CO2 GLUCOSE 137* 87 95  BUN 30* 21* 25*  CREATININE 0.72 0.50* 0.49*  CALCIUM 8.1* 8.1* 8.1*  AST 31  --   --   ALT 21  --   --   ALKPHOS 66  --   --   BILITOT 0.6  --   --    ------------------------------------------------------------------------------------------------------------------ estimated creatinine clearance is 76.1 mL/min (by C-G formula based on Cr of 0.49). ------------------------------------------------------------------------------------------------------------------ No results for input(s): HGBA1C in the last 72 hours. ------------------------------------------------------------------------------------------------------------------ No results for input(s): CHOL, HDL, LDLCALC, TRIG, CHOLHDL, LDLDIRECT in the last 72 hours. ------------------------------------------------------------------------------------------------------------------ No results for input(s): TSH, T4TOTAL, T3FREE, THYROIDAB in the last 72 hours.  Invalid input(s): FREET3 ------------------------------------------------------------------------------------------------------------------ No results for input(s): VITAMINB12, FOLATE, FERRITIN, TIBC, IRON, RETICCTPCT in the last 72 hours.  Coagulation profile No results for input(s): INR, PROTIME in the last 168 hours.  No results for input(s): DDIMER in the last 72 hours.  Cardiac Enzymes No results for input(s): CKMB, TROPONINI, MYOGLOBIN in the last 168 hours.  Invalid input(s):  CK ------------------------------------------------------------------------------------------------------------------ Invalid input(s): POCBNP     Time Spent in minutes   30 minutes   Giulia Hickey M.D on 02/23/2015 at 1:00 PM  Between 7am to 7pm - Pager - [phone]  After 7pm go to [email or web address] - password [facility]  [facility]   Office  [phone]

## 2015-03-02 DEATH — deceased

## 2015-04-08 DIAGNOSIS — E785 Hyperlipidemia, unspecified: Secondary | ICD-10-CM | POA: Insufficient documentation

## 2016-11-21 IMAGING — CR DG CHEST 1V PORT
1 series · 1 of 1 positions shown · non-contrast
Comparison: 10/22/2014

CLINICAL DATA: Respiratory failure

EXAM:
PORTABLE CHEST - 1 VIEW

[AP]
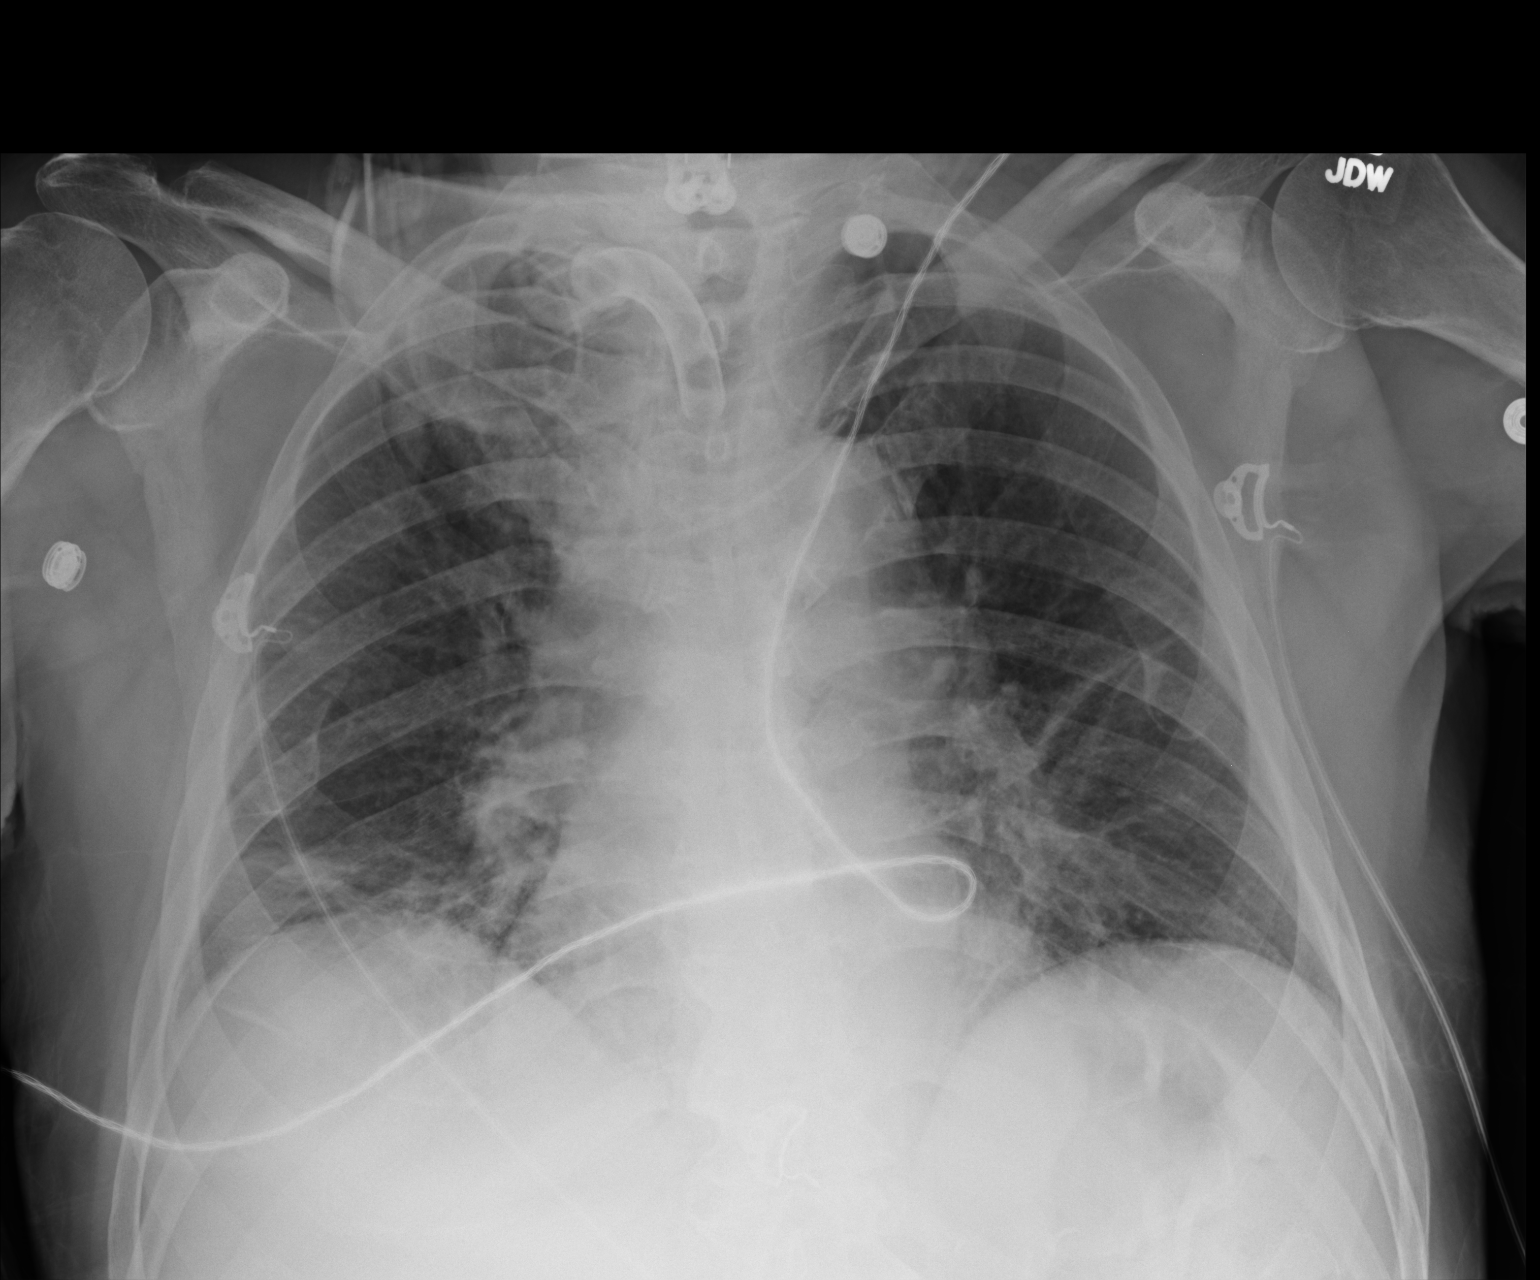

[1 of 1 positions shown; findings below may reference images not displayed]

FINDINGS: Tracheostomy device stable. Patchy infiltrates or subsegmental
atelectasis in both lung bases, largely stable. Heart size normal.
Central pulmonary vascular congestion.
No effusion.
Cervical fixation hardware partially seen.
IMPRESSION: 1. Little interval change in bibasilar atelectasis or infiltrates.

## 2016-11-27 IMAGING — RF DG SWALLOWING FUNCTION - NRPT [facility]
1 series · 18 of 24 positions shown · non-contrast
Comparison: none

[Series 1: run · 7 acquisitions, 18 frames shown]
[im 1/7]
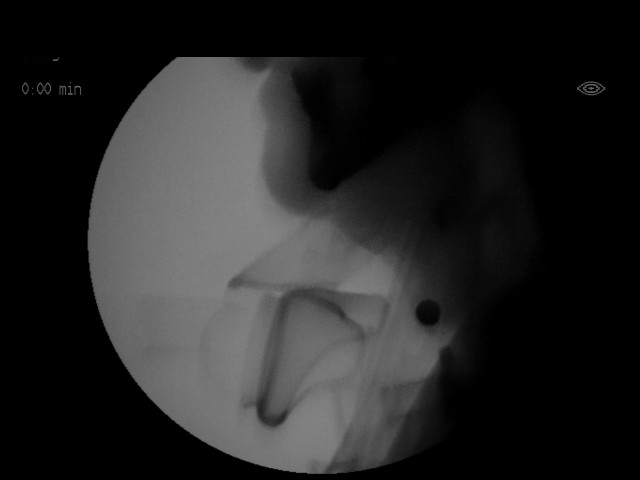
[im 1/7]
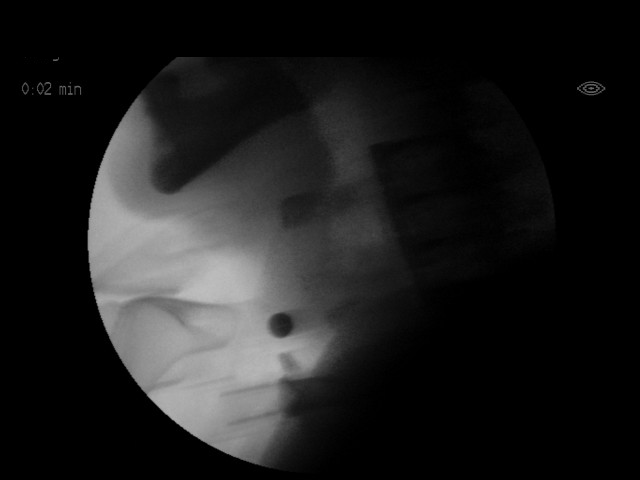
[im 2/7]
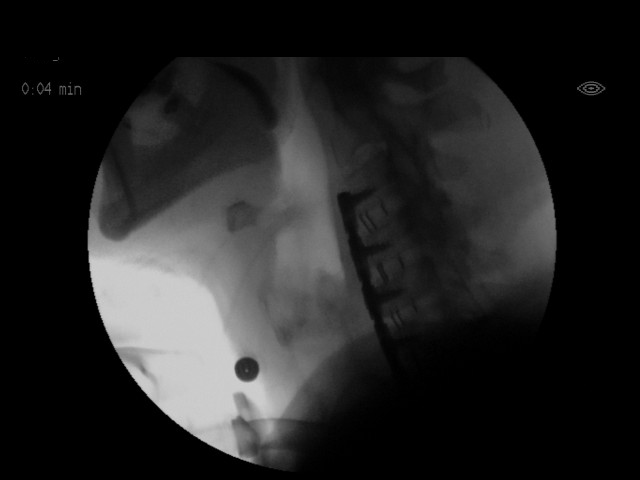
[im 2/7]
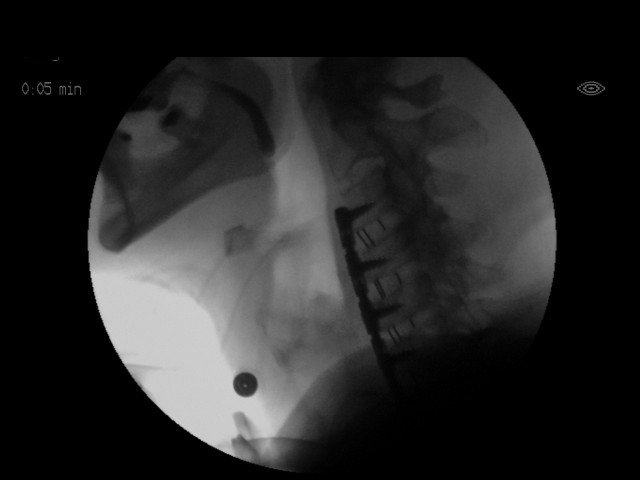
[im 2/7]
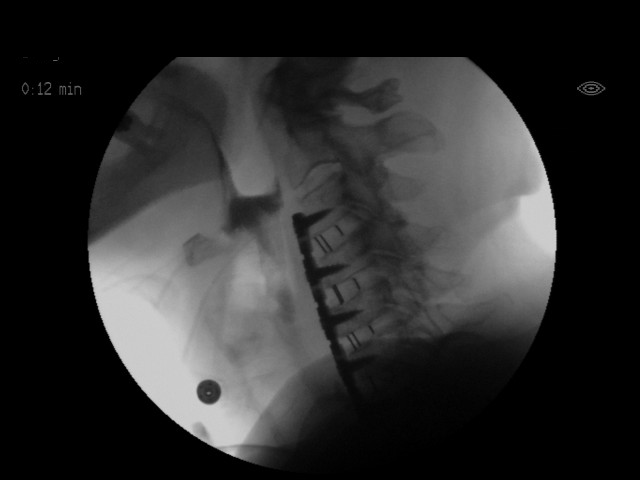
[im 3/7]
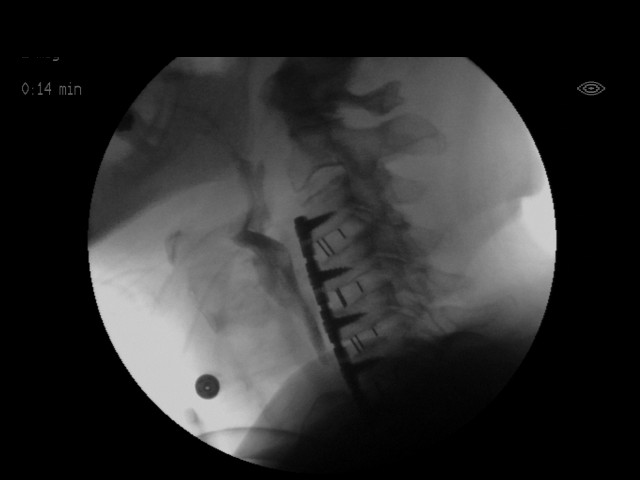
[im 3/7]
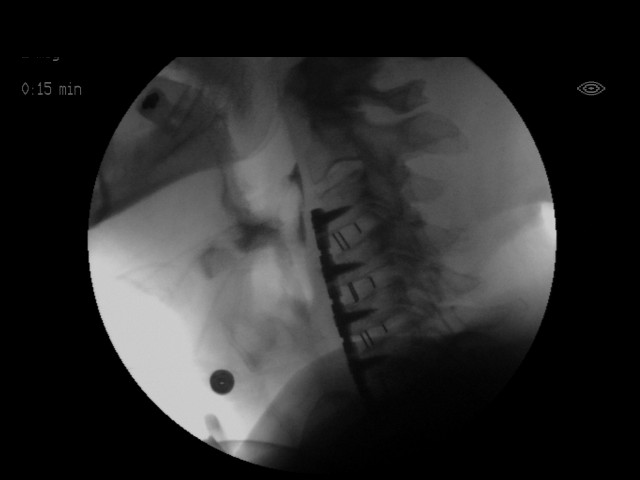
[im 4/7]
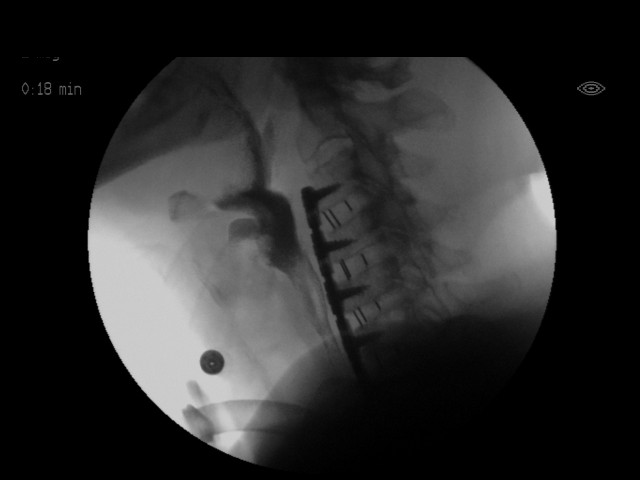
[im 4/7]
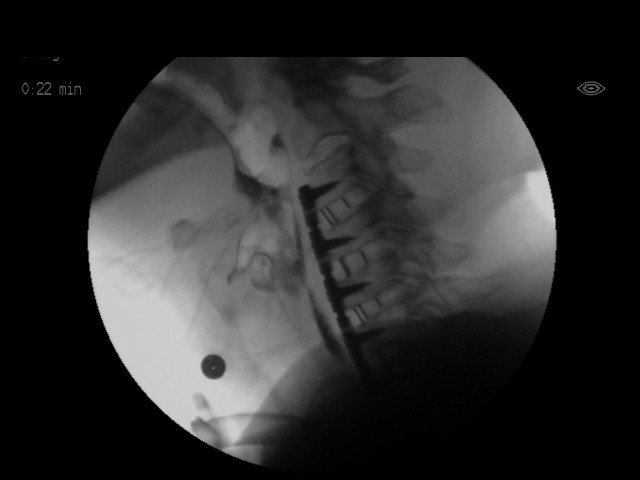
[im 4/7]
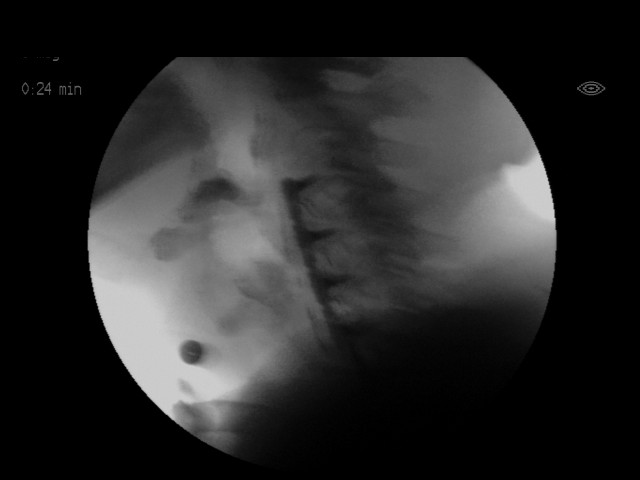
[im 5/7]
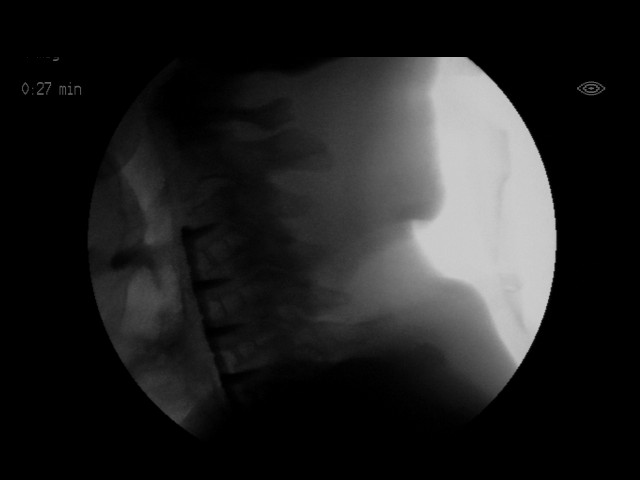
[im 5/7]
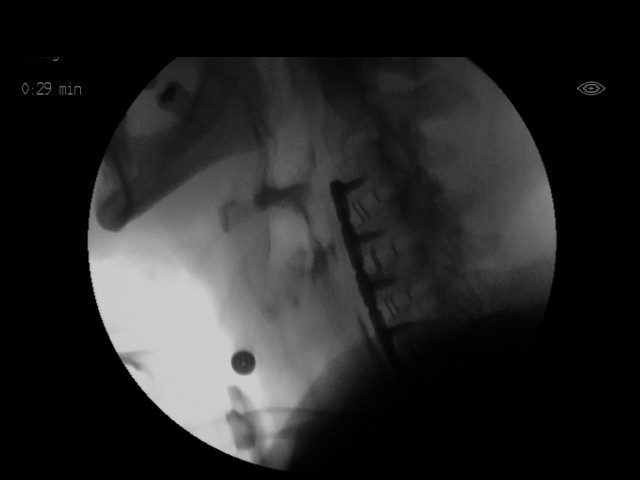
[im 5/7]
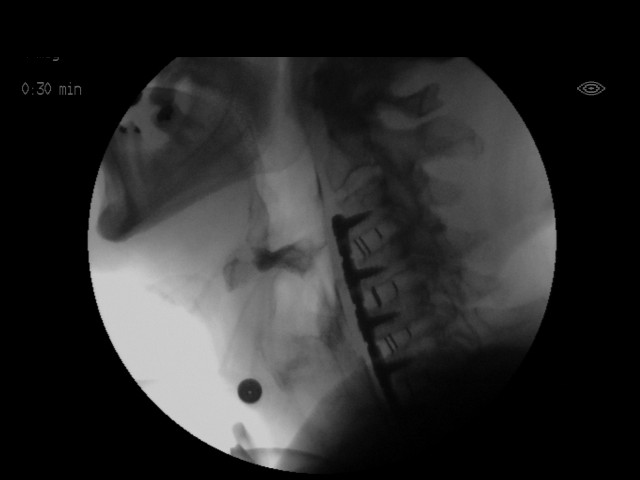
[im 6/7]
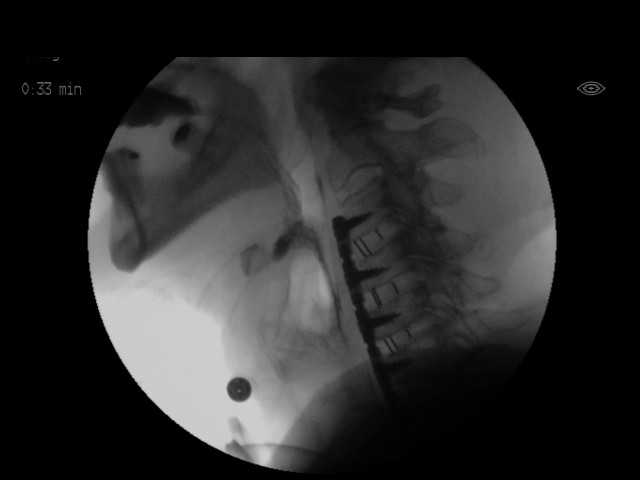
[im 6/7]
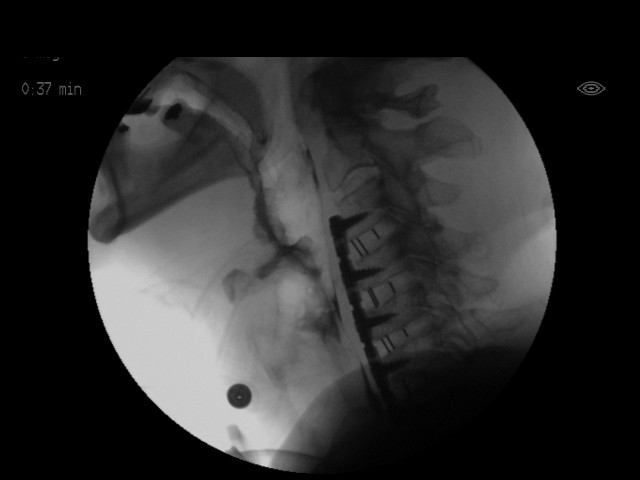
[im 6/7]
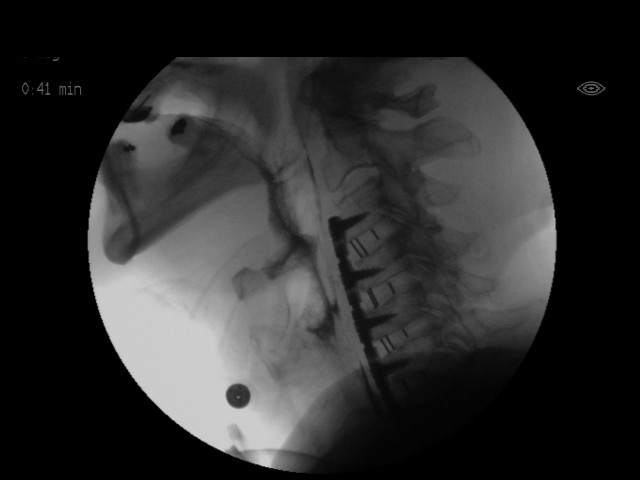
[im 7/7]
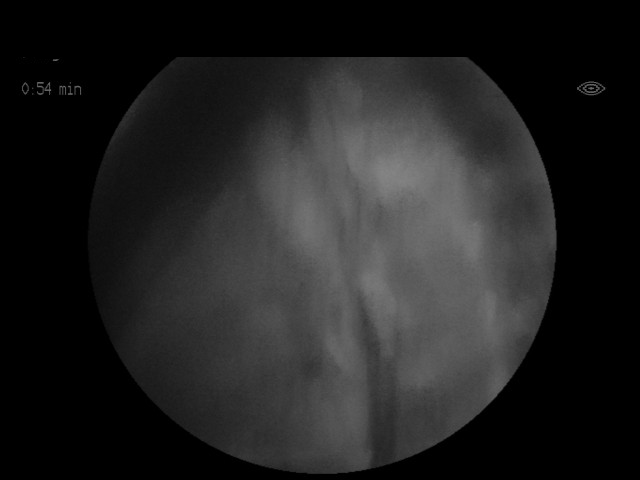
[im 7/7]
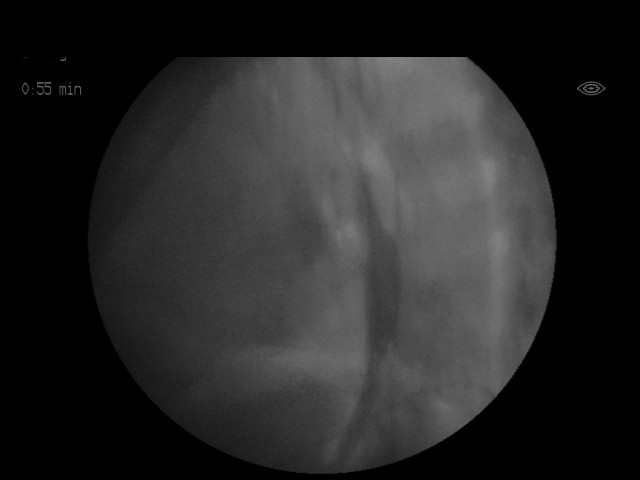

[18 of 24 positions shown; findings below may reference images not displayed]

Canned report from images found in remote index.

Refer to host system for actual result text.

## 2016-12-29 IMAGING — CR DG CHEST 1V PORT
1 series · 1 of 1 positions shown · non-contrast
Comparison: 12/02/2014

CLINICAL DATA: Tracheostomy tube change.

EXAM:
PORTABLE CHEST - 1 VIEW

[AP]
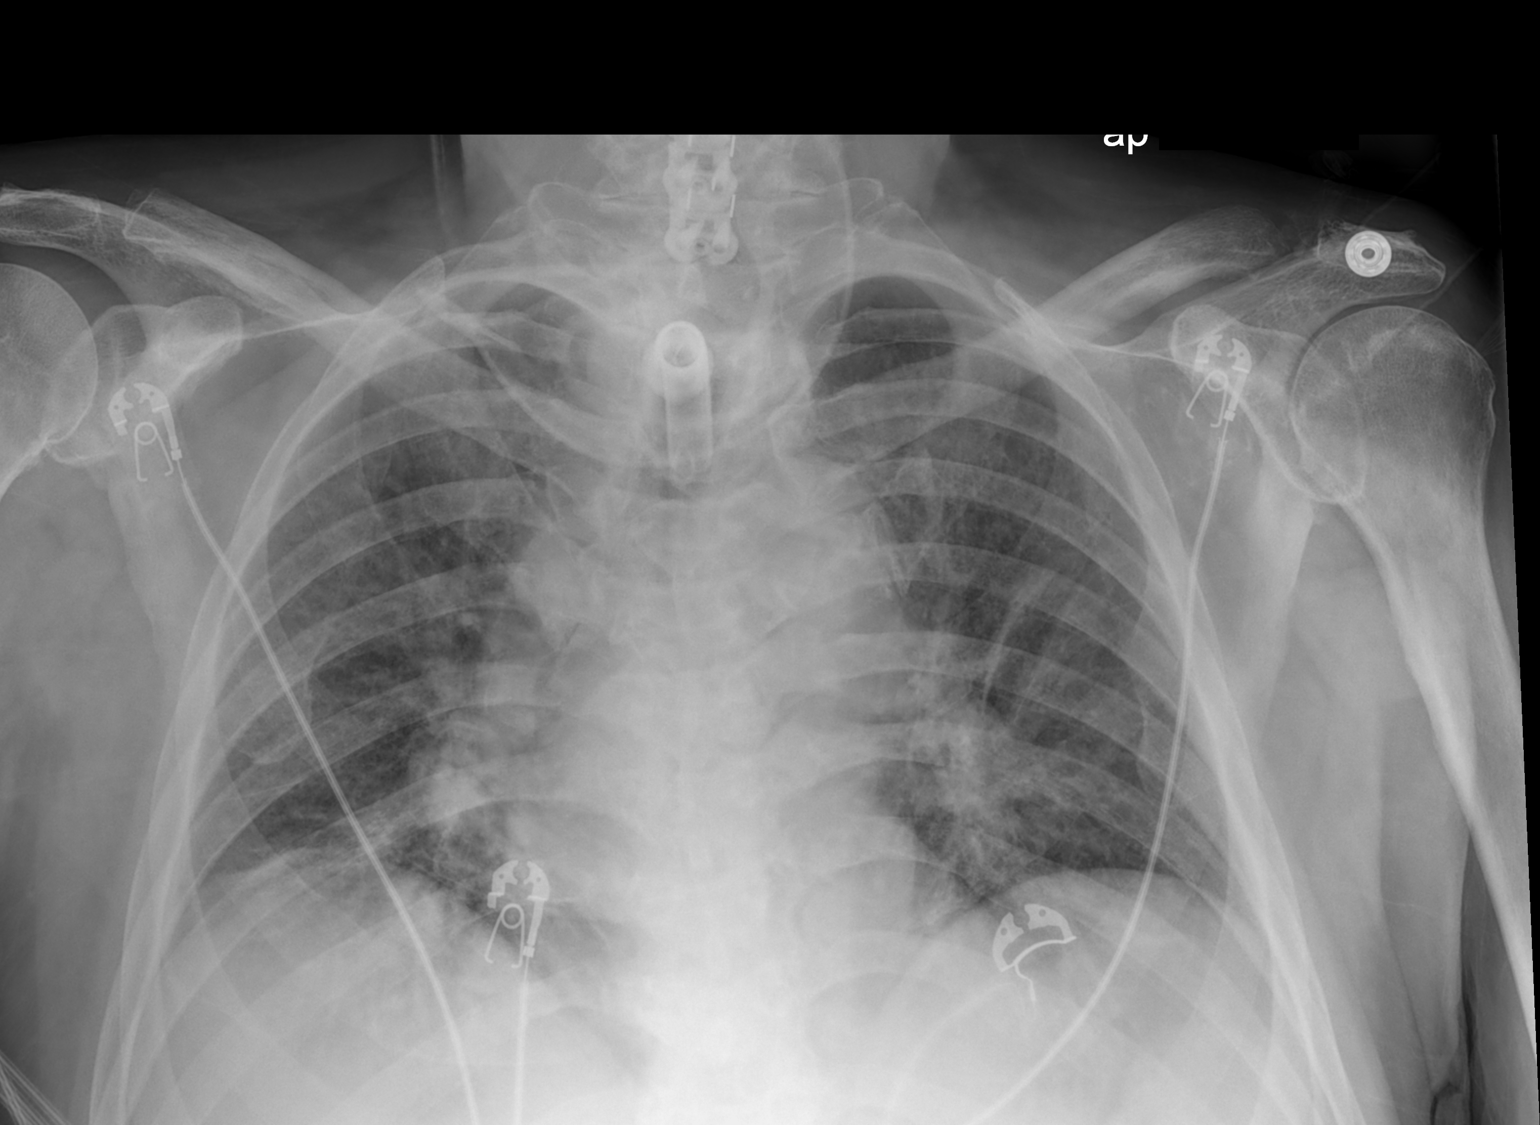

[1 of 1 positions shown; findings below may reference images not displayed]

FINDINGS: Tracheostomy tube tip measures 4.6 cm above the carina. Shallow
inspiration with atelectasis in the lung bases. Heart size and
pulmonary vascularity are normal. No focal airspace disease in the
lungs. No blunting of costophrenic angles. No pneumothorax.
Postoperative changes in the cervical spine. Similar appearance to
previous study.
IMPRESSION: Shallow inspiration with linear atelectasis in the lung bases.

## 2017-01-22 IMAGING — CT CT CERVICAL SPINE W/O CM
3 of 6 series · 10 of 33 positions shown, 12 images · non-contrast
Comparison: 12/02/2014

CLINICAL DATA: Fall. Head and neck injury and pain. Previous spinal
fusion.

EXAM:
CT HEAD WITHOUT CONTRAST
CT CERVICAL SPINE WITHOUT CONTRAST
TECHNIQUE: Multidetector CT imaging of the head and cervical spine was
performed following the standard protocol without intravenous
contrast. Multiplanar CT image reconstructions of the cervical spine
were also generated.

[Series 5: c-spine st · axial · 0.26mm/px · z∈[+1109,+1221]mm · 2 of 114 slices shown, 3 images]
[im 29/114  soft-tissue]
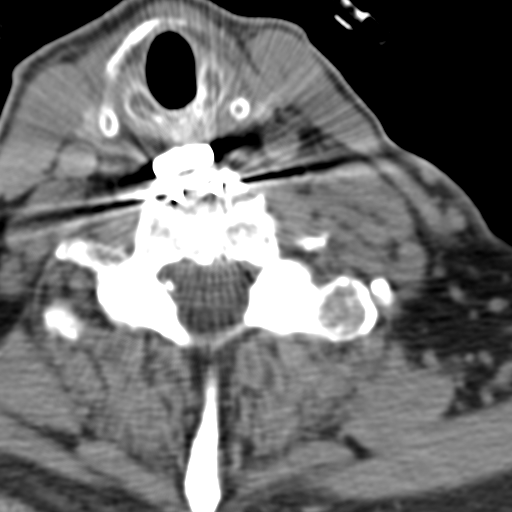
[im 29/114  bone]
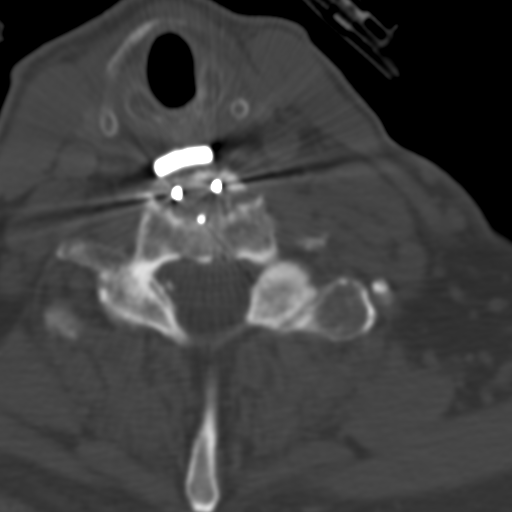
[im 85/114  bone]
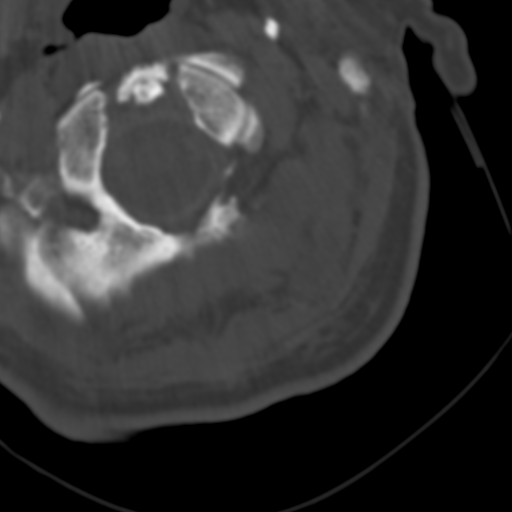

[Series 9: coronal recons · coronal · 0.23mm/px · 3 of 61 slices shown]
[im 13/61  bone]
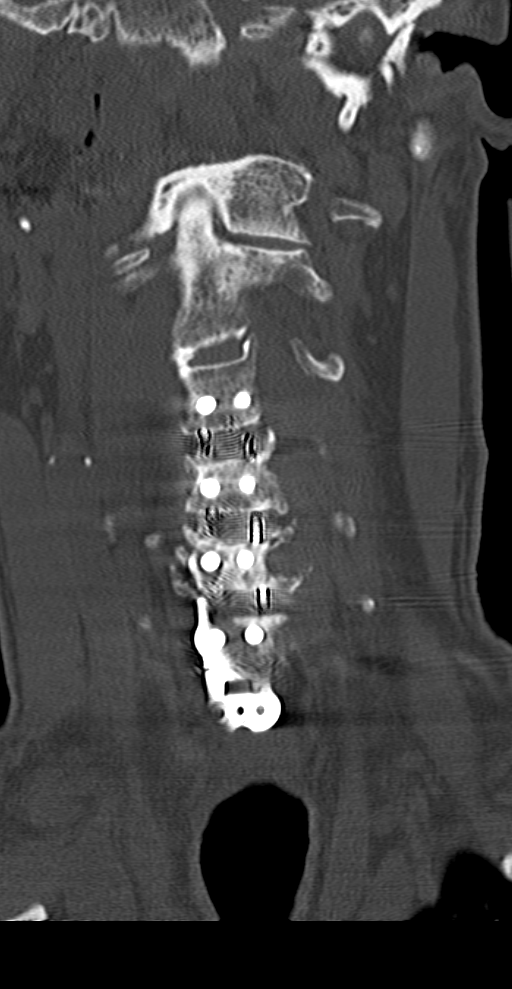
[im 25/61  bone]
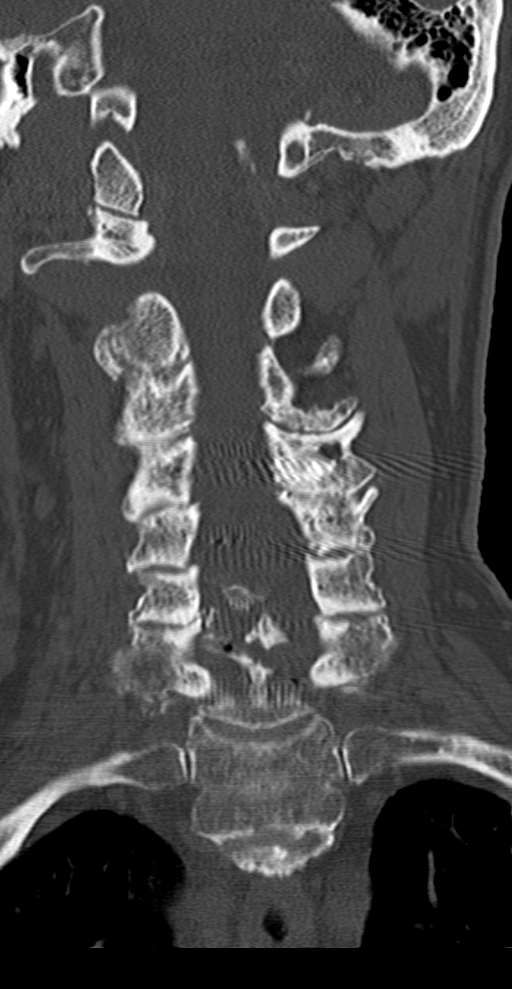
[im 37/61  bone]
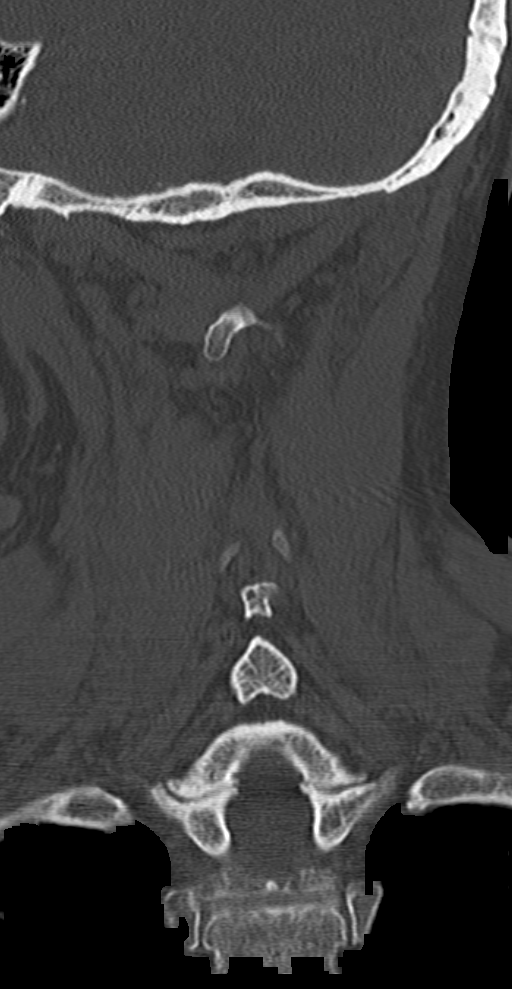

[Series 10: sagittal recons · sagittal · 0.25mm/px · 5 of 61 slices shown, 6 images]
[im 21/61  bone]
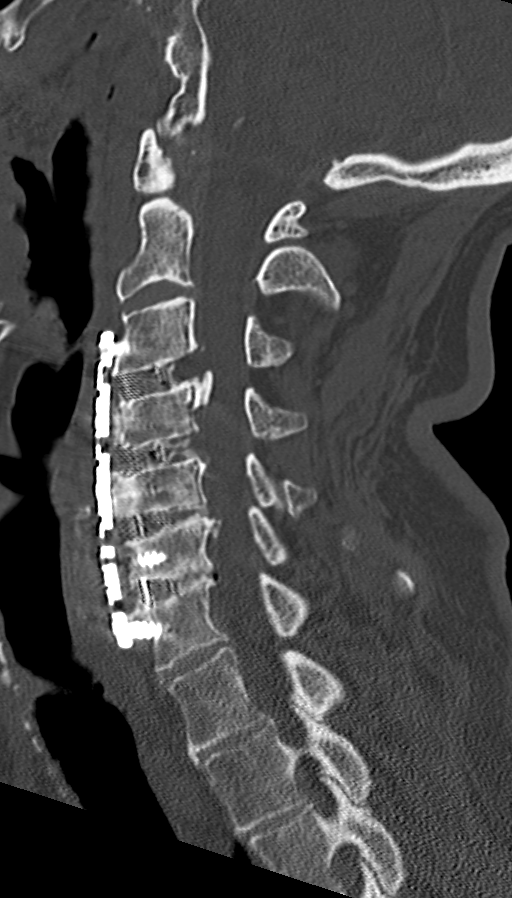
[im 26/61  bone]
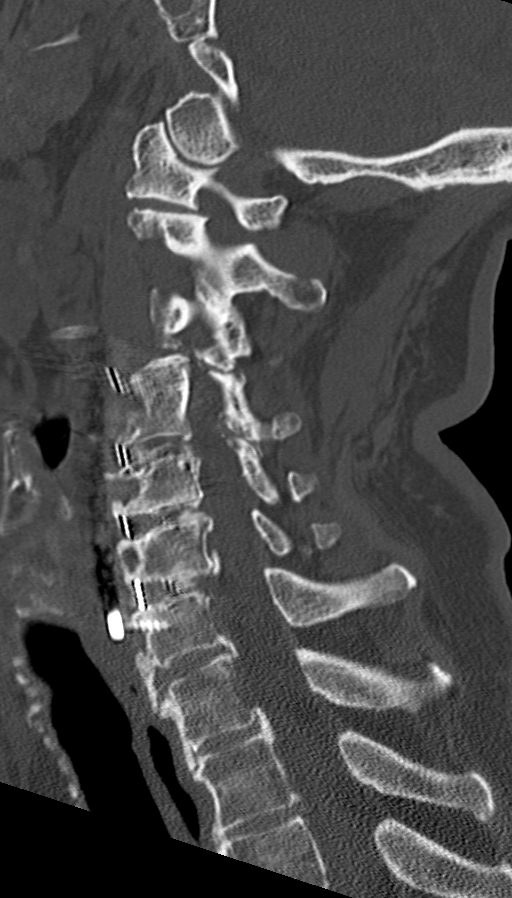
[im 31/61  soft-tissue]
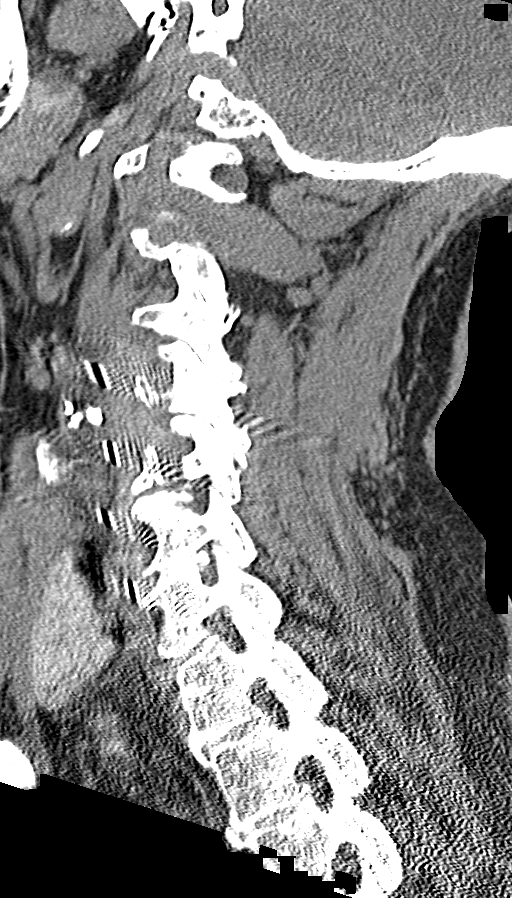
[im 31/61  bone]
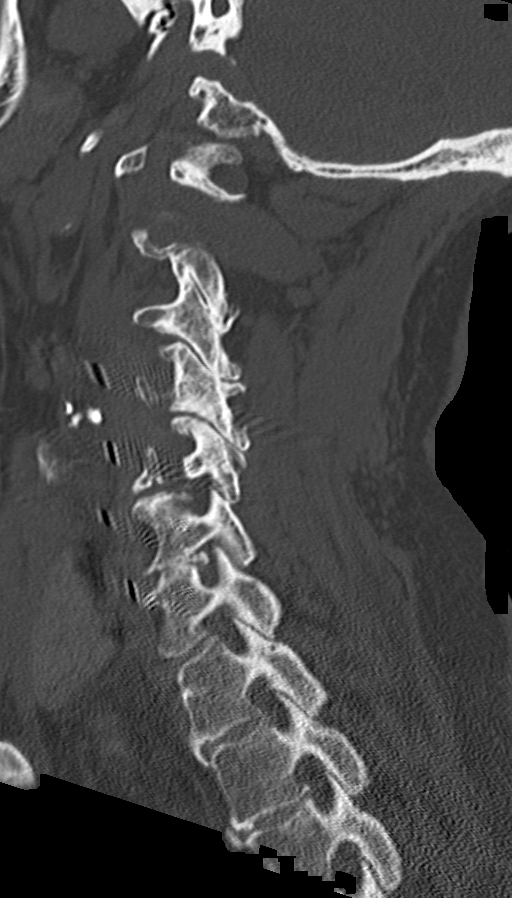
[im 36/61  bone]
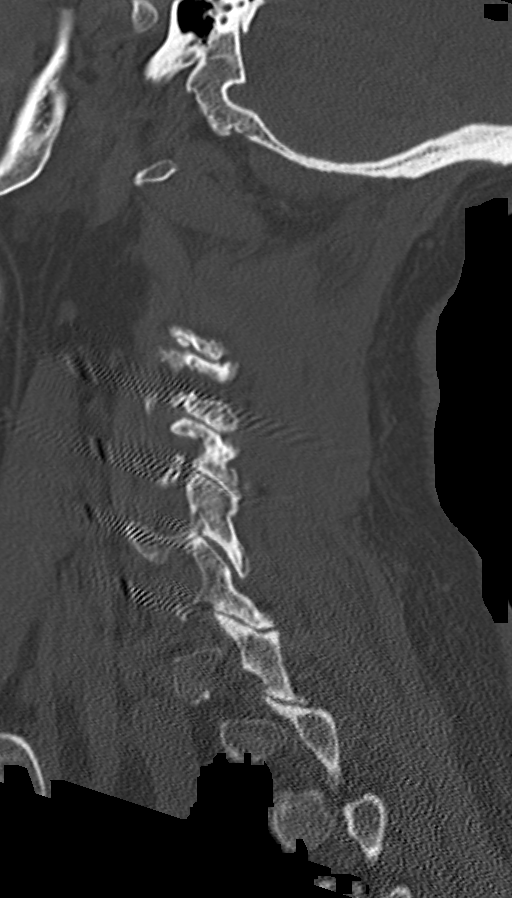
[im 41/61  bone]
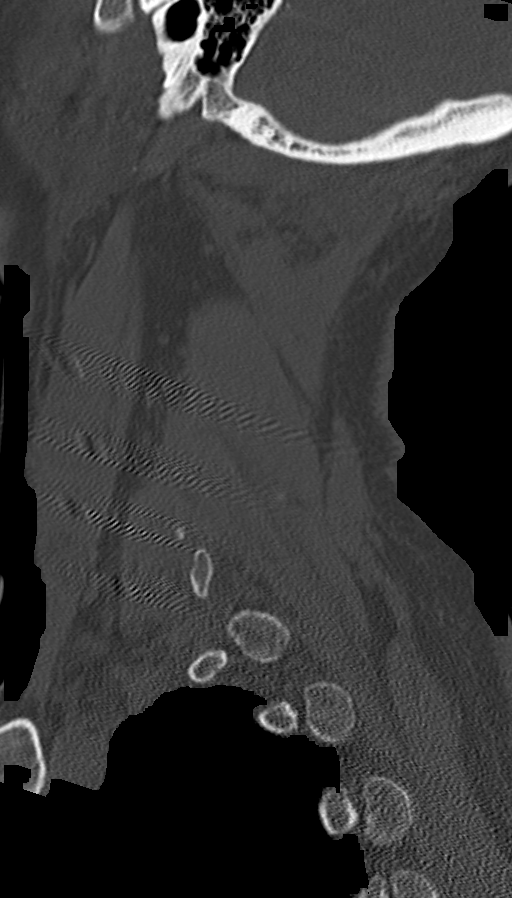

[10 of 33 positions shown; findings below may reference images not displayed]

FINDINGS: CT HEAD FINDINGS

There is no evidence of intracranial hemorrhage, brain edema, or
other signs of acute infarction. There is no evidence of
intracranial mass lesion or mass effect. No abnormal extraaxial
fluid collections are identified.

Moderate cerebral atrophy and chronic small vessel disease are
unchanged in appearance. Ventricles stable in size. No evidence of
skull fracture.

CT CERVICAL SPINE FINDINGS

Hardware from anterior cervical disc fusion again seen from levels
of C3-C7. Cervical spine alignment remains normal. No evidence of
acute cervical spine fracture. Mild to moderate bilateral facet DJD
again demonstrated, left side greater than right. Atlantoaxial
degenerative changes also appears stable. No other significant bone
abnormality identified.
IMPRESSION: No acute intracranial abnormality. Stable cerebral atrophy and
chronic small vessel disease.

No evidence of acute cervical spine fracture or subluxation.
Degenerative spondylosis and prior anterior cervical disc fusion
from C3-C7 again seen.
